# Patient Record
Sex: Female | Born: 1945 | Race: White | Hispanic: No | Marital: Married | Smoking: Former smoker
Health system: Southern US, Community
[De-identification: ages and names within clinical notes are randomized; demographics above are authoritative.]

## PROBLEM LIST (undated history)

## (undated) DIAGNOSIS — N189 Chronic kidney disease, unspecified: Secondary | ICD-10-CM

## (undated) DIAGNOSIS — I639 Cerebral infarction, unspecified: Secondary | ICD-10-CM

## (undated) DIAGNOSIS — K5792 Diverticulitis of intestine, part unspecified, without perforation or abscess without bleeding: Secondary | ICD-10-CM

## (undated) DIAGNOSIS — H18469 Peripheral corneal degeneration, unspecified eye: Secondary | ICD-10-CM

## (undated) DIAGNOSIS — E119 Type 2 diabetes mellitus without complications: Secondary | ICD-10-CM

## (undated) DIAGNOSIS — G44009 Cluster headache syndrome, unspecified, not intractable: Secondary | ICD-10-CM

## (undated) DIAGNOSIS — J302 Other seasonal allergic rhinitis: Secondary | ICD-10-CM

## (undated) DIAGNOSIS — I10 Essential (primary) hypertension: Secondary | ICD-10-CM

## (undated) DIAGNOSIS — L509 Urticaria, unspecified: Secondary | ICD-10-CM

## (undated) DIAGNOSIS — M1990 Unspecified osteoarthritis, unspecified site: Secondary | ICD-10-CM

## (undated) DIAGNOSIS — E785 Hyperlipidemia, unspecified: Secondary | ICD-10-CM

## (undated) HISTORY — DX: Peripheral corneal degeneration, unspecified eye: H18.469

## (undated) HISTORY — DX: Essential (primary) hypertension: I10

## (undated) HISTORY — DX: Hyperlipidemia, unspecified: E78.5

## (undated) HISTORY — DX: Cluster headache syndrome, unspecified, not intractable: G44.009

## (undated) HISTORY — DX: Cerebral infarction, unspecified: I63.9

## (undated) HISTORY — DX: Diverticulitis of intestine, part unspecified, without perforation or abscess without bleeding: K57.92

## (undated) HISTORY — DX: Urticaria, unspecified: L50.9

## (undated) HISTORY — DX: Unspecified osteoarthritis, unspecified site: M19.90

## (undated) HISTORY — DX: Chronic kidney disease, unspecified: N18.9

## (undated) HISTORY — DX: Other seasonal allergic rhinitis: J30.2

## (undated) HISTORY — DX: Type 2 diabetes mellitus without complications: E11.9

---

## 1999-05-02 ENCOUNTER — Other Ambulatory Visit: Admission: RE | Admit: 1999-05-02 | Discharge: 1999-05-02 | Payer: Self-pay | Admitting: Family Medicine

## 2000-06-25 ENCOUNTER — Other Ambulatory Visit: Admission: RE | Admit: 2000-06-25 | Discharge: 2000-06-25 | Payer: Self-pay | Admitting: Family Medicine

## 2001-07-16 ENCOUNTER — Other Ambulatory Visit: Admission: RE | Admit: 2001-07-16 | Discharge: 2001-07-16 | Payer: Self-pay | Admitting: Family Medicine

## 2003-10-23 ENCOUNTER — Encounter: Admission: RE | Admit: 2003-10-23 | Discharge: 2003-10-23 | Payer: Self-pay | Admitting: Family Medicine

## 2005-10-11 ENCOUNTER — Ambulatory Visit: Payer: Self-pay | Admitting: Internal Medicine

## 2005-10-11 ENCOUNTER — Observation Stay (HOSPITAL_COMMUNITY): Admission: EM | Admit: 2005-10-11 | Discharge: 2005-10-12 | Payer: Self-pay | Admitting: Emergency Medicine

## 2006-07-17 ENCOUNTER — Ambulatory Visit: Payer: Self-pay | Admitting: Cardiology

## 2006-07-17 ENCOUNTER — Inpatient Hospital Stay (HOSPITAL_COMMUNITY): Admission: EM | Admit: 2006-07-17 | Discharge: 2006-07-19 | Payer: Self-pay | Admitting: Emergency Medicine

## 2006-07-18 ENCOUNTER — Encounter (INDEPENDENT_AMBULATORY_CARE_PROVIDER_SITE_OTHER): Payer: Self-pay | Admitting: Neurology

## 2006-07-18 ENCOUNTER — Encounter: Payer: Self-pay | Admitting: Cardiology

## 2009-04-23 ENCOUNTER — Encounter: Admission: RE | Admit: 2009-04-23 | Discharge: 2009-04-23 | Payer: Self-pay | Admitting: Family Medicine

## 2010-03-08 LAB — LIPID PANEL
Cholesterol: 175 mg/dL (ref 0–200)
HDL: 43 mg/dL (ref 35–70)
LDL Cholesterol: 107 mg/dL
Triglycerides: 208 mg/dL — AB (ref 40–160)

## 2010-03-08 LAB — HEMOGLOBIN A1C: Hgb A1c MFr Bld: 6.5 % — AB (ref 4.0–6.0)

## 2010-08-02 LAB — CBC AND DIFFERENTIAL
HEMATOCRIT: 40 % (ref 36–46)
HEMOGLOBIN: 13.2 g/dL (ref 12.0–16.0)
PLATELETS: 276 10*3/uL (ref 150–399)
WBC: 9.6 10^3/mL

## 2010-08-02 LAB — TSH: TSH: 5.68 u[IU]/mL (ref 0.41–5.90)

## 2010-12-30 NOTE — Discharge Summary (Signed)
Jamie Aguirre, Jamie Aguirre                ACCOUNT NO.:  [id number]   MEDICAL RECORD NO.:  [id number]          PATIENT TYPE:  INP   LOCATION:  3037                         FACILITY:  [facility]   PHYSICIAN:  Jamie Aguirre, M.D.DATE OF BIRTH:  02/13/1946   DATE OF ADMISSION:  10/11/2005  DATE OF DISCHARGE:  10/12/2005                                 DISCHARGE SUMMARY   CONSULTANT:  Dr. Pearlean Aguirre, Neurology.   PRIMARY CARE PHYSICIAN:  Dr. Laurann Montana.   DISCHARGE DIAGNOSES:  1.  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  2.  Coronary artery disease equivalent.  3.  Hyperlipidemia.  4.  Depression.  5.  History of hypertension.  6.  History of diverticulitis.   DISCHARGE MEDICATIONS:  1.  Aggrenox one capsule p.o. daily x2 weeks, then increase to one capsule      p.o. twice daily.  2.  Lovastatin 20 mg p.o. nightly.   The patient will continue all of her previous medications of:  1.  Atenolol 100 mg p.o. nightly.  2.  Zyrtec 75 mg p.o. nightly.   CONDITION ON DISCHARGE:  The patient's overall disposition from her initial  presentation is improved.  She is being discharged to home with home health  PT.   ACTIVITY:  As per home health PT.   DISCHARGE DIET:  A heart-healthy diet.   FOLLOWUP APPOINTMENT:  The patient will follow up with PCP, Dr. Laurann Montana, in 1-2 weeks.   ALLERGIES:  The patient has a listed allergy to NSAIDS.  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   HISTORY OF PRESENT ILLNESS:  The patient is a 65 year old white female with  a past medical history of hypertension and extensive family history of CVA  and CAD, who presented to the emergency room after waking up on October 11, 2005 with right-sided arm weakness and dysarthric speech.  Initially,  she  became quite concerned and was worried that she had had a stroke, as she had  seen similar symptoms in her mother.   HOSPITAL COURSE:  She had come in and after several hours, symptoms resolved  on their own.  Initially, this was thought to be a TIA, given that her  symptoms were under 24 hours; however, a CT of the head was negative for any  acute infarct.  However, an MRI showed a sub-centimeter area of acute  infarction in the left posterior thalamus, centrum semiovale and posterior  limb of the internal capsule on the left.  Following these findings, I  discussed this with Dr. Pearlean Aguirre and the patient was felt to indeed have had a  CVA.  When I discussed these findings with the patient, she was quite  concerned and felt that she was indeed quite lucky.  We had a fasting lipid  profile ordered on the patient in the morning and it was found  the patient  had an elevated cholesterol with an LDL of 151.  In addition, given the  patient's acute CVA, noting of old infarct on CT and cholesterol findings,  we recommended the patient start additional medication of Aggrenox one  capsule p.o. daily x2 weeks, then increase to twice daily.  In addition, we  also recommended the patient start a cholesterol medication with a target  LDL range of a CAD equivalent of LDL range down to 70.  I discussed this  with the patient and initially she was quite resistant to taking  medications, especially Aggrenox; however, we discussed extensively the  benefits versus risks and we indeed felt that it would be better if she go  to taking these medications and she concurred.  In addition, Physical  Therapy worked with patient and recommended home health physical therapy.  In addition, we have also recommended further weight loss and dietary  counseling for this patient, on which she says that she will work.  The  patient's overall disposition is improved from her initial presentation.  How she does long-term  will depend on whether or not she is compliant with  her medications and the lifestyle changes.   The patient has a listed allergy to NSAIDS.  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  The patient will,  prior to discharge, receive 1 dose of Aggrenox and be monitored for several  hours; if she has no reaction, she will then be discharged on Aggrenox.      Jamie Aguirre, M.D.  Electronically Signed     SKK/MEDQ  D:  10/12/2005  T:  10/13/2005  Job:  [id number]   cc:   Jamie Aguirre. Jamie Aguirre, M.D.  Fax: [phone]   Jamie P. Pearlean Brownie, MD  Fax: [phone]

## 2010-12-30 NOTE — Consult Note (Signed)
[name], MEDAL                ACCOUNT NO.:  [id number]   MEDICAL RECORD NO.:  [id number]          PATIENT TYPE:  EMS   LOCATION:  MAJO                         FACILITY:  [facility]   PHYSICIAN:  Pramod P. Pearlean Brownie, MD    DATE OF BIRTH:  [birth year]   DATE OF CONSULTATION:  DATE OF DISCHARGE:                                   CONSULTATION   REFERRING PHYSICIAN:  Dr. Dione Booze   REASON FOR CONSULTATION:  Stroke.   HISTORY OF PRESENT ILLNESS:  [name] is a 65 year old Caucasian lady who  was admitted with sudden onset of right-sided weakness and expressive  aphagia when she woke up from sleep at 5:30 today. Patient states she went  to sleep at about midnight and was fine at that time. She did not wake up in  between to go to the restroom. When she woke up at 5:30 she noticed that her  right arm was weak, when she tried to get out of bed the right leg was weak  too, and she has trouble formulating speech. She also had trouble dialing  911 on the phone, it took her about 10-15 minutes. By the time EMS brought  her to the hospital she was still weak and had speech difficulty. The  symptoms seemed apparently resolved barely 15 minutes prior to my  assessment. She has no known prior history of stroke, TIA, or significant  neurological problems.   PAST MEDICAL HISTORY:  Significant for:  1.  Hypertension.  2.  Diverticulitis.  3.  Sinusitis.  4.  Depression.   SOCIAL HISTORY:  The patient is retired. 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 She does not smoke or drink. She lives alone.  Primary physician is Dr. Cliffton Asters.   MEDICATION ALLERGIES:  MULTIPLE. ONLY INCLUDING SULFATE AND SULFA LISTED IN  THE CHART, BUT SHE IS UNABLE TO GIVE ME THE COMPLETE LIST AT PRESENT.   HOME MEDICATIONS:  1.  Atenolol 100 mg a day  2.  Cyclobenzaprine 10 mg 3 times a day.  3.  Zyrtec 10 mg at bedtime.  4.  Fluoxetine 10 mg daily.   REVIEW OF SYSTEMS:  Negative for any  chest pain, shortness of breath, cough,  diarrhea. Positive for weakness, numbness, and speech difficulties.   PHYSICAL EXAMINATION:  GENERAL:  Reveals pleasant middle-aged Caucasian lady  who is sitting comfortably up in bed.  VITAL SIGNS:  She is afebrile. Pulse rate 64. Respiratory rate 24. Blood  pressure at present is 147/86.  HEENT:  Head is nontraumatic. ENT exam is significant for decreased hearing  bilaterally. Face is symmetric bilaterally. Movements are normal. Tongue is  midline.  NECK:  Supple without bruit.  CARDIAC:  Regular heart sounds.  ABDOMEN:  Soft, nontender.  NEUROLOGIC:  She is pleasant, awake, alert, and cooperative. There is no  aphasia or __________. She can name, repeat, and comprehend quite well.  Cranial nerve exam is unremarkable. Motor system exam in upper extremities  suggest symmetric strength, tone, and reflexes are __________.  Plantars are  downgoing.  Sensation is intact. Finger-to-nose and heel-toe coordination  accurate. Gait was not tested. NIH stroke scale is 0.   DATA REVIEWED:  CT scan of the head noncontrasted done today reveals small  __________ remote age infarct in the right head. Area of hyperdensity is  noted in the left middle cerebral artery, which may represent __________ and  dural clot is also possibility. There is a very subtle area of low density  in the left internal capsule, which may represent early infarct. CBC and  admission labs are unremarkable.   IMPRESSION:  [age]-year-old lady with sudden onset of expressive  aphagia, right hemiparesis with onset on sleep who presented beyond 3 hours.  Symptoms have resolved. She likely has a small left middle cerebral artery  infarct, which has improved. The patient has had near complete improvement  in her symptoms and presented beyond 3 hours and, hence, she is not a  candidate for thrombolysis or __________  stroke study. She will be admitted  for further stroke risk  stratification workup, telemetry monitoring. We will  start her on IV heparin until an MRI is obtained and the status of left  middle cerebral artery is known, as there is a question of possible clot  there. Will also check 2-D echocardiogram, Doppler studies, carotid  ultrasound, fasting lipid profile, hemoglobin A1C, and homocystine. Patient  has been admitted to __________ [facility], will be happy to follow on  consults. Kindly call for questions.           ______________________________  Sunny Schlein. Pearlean Brownie, MD     PPS/MEDQ  D:  10/11/2005  T:  10/11/2005  Job:  [id number]   cc:   Stacie Acres. Cliffton Asters, M.D.  Fax: [phone]

## 2010-12-30 NOTE — H&P (Signed)
[name], [name]                ACCOUNT NO.:  [id number]   MEDICAL RECORD NO.:  [id number]          PATIENT TYPE:  INP   LOCATION:  3705                         FACILITY:  [facility]   PHYSICIAN:  Marlan Palau, M.D.  DATE OF BIRTH:  1945/10/10   DATE OF ADMISSION:  07/17/2006  DATE OF DISCHARGE:                              HISTORY & PHYSICAL   HISTORY OF PRESENT ILLNESS:  Jamie Aguirre is a 65 year old right-handed  female born Sep 24, 1945 with a history of cerebrovascular disease  with a prior cerebrovascular infarct in February 2007. Seen at that time  by Dr. Pearlean Brownie.  This patient had an acute event involving the left  posterior thalamus, centrum semiovale.  The patient was treated with  Aggrenox initially but the patient is now on aspirin and Plavix.  The  patient returns to the emergency room today with a one-week history of  onset of right-sided visual field disturbance and left-sided headache.  This has been persistent.  The patient was seen by the eye doctor today  and was sent to the emergency room for an evaluation of possible stroke.  CT scan of the brain done through the emergency room does show left  parietal white matter low density area and the low density area in the  left thalamus but this is old, similar to what was seen in February  2007.  Neurology was asked to see this patient for further evaluation.  The patient noted onset of the visual field disturbance; for the first  24 hours, the patient had slowness of speech and slurring of speech but  this has fully resolved.  The patient reports no numbness or weakness in  the arms or legs or balance problems.   PAST MEDICAL HISTORY:  Significant for:  1. History of cerebrovascular disease in the past as above.  New onset      right visual field deficit.  Headache.  2. Hypertension.  3. Dyslipidemia.  4. Diverticulitis.  5. Depression.  6. Obesity.  7. Multiple allergies.  8. Decreased auditory acuity.  9.  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   CURRENT MEDICATIONS:  Include:  1. Aspirin 81 mg daily.  2. Plavix 75 mg daily.  3. Atenolol 100 mg daily.  4. Zyrtec 10 mg daily.  5. The patient is on another blood pressure medication that she could      not remember the name of, probably Lovastatin, as she was on this      previously.   ALLERGIES:  THE PATIENT HAS ALLERGY TO PENICILLIN, SULFA DRUGS, CODEINE  AND CIPRO.   SOCIAL HISTORY:  Does not smoke or drink.   SOCIAL HISTORY:  The patient [place] in the [place]  area.  The patient is not married.  Has no children.  Is not employed.   FAMILY MEDICAL HISTORY:  Notable that the mother died with stroke.  Father died with an MI but had several years of incapacity from anoxic  injury following a bypass procedure for his heart.   REVIEW OF SYSTEMS:  Notable for no  recent fevers or chills.  The patient  does note headache but does have a long term history of neck pain.  Denies any problems with shortness of breath, chest pains, abdominal  pain, nausea, vomiting, problems controlling the bowels or bladder.  Denies any black out episodes or gait disturbance problems.  The patient  has no siblings.  She is an only child.   PHYSICAL EXAMINATION:  VITAL SIGNS:  Blood pressure 153/89.  Heart rate  70.  Respiratory rate 18.  Temperature afebrile.  GENERAL: This patient is a markedly obese white female who is alert and  cooperative the time of examination.  HEENT:  Head is atraumatic.  Eyes:  Pupils equal, round and reactive to  light.  Disks are sharp and flat bilaterally.  NECK:  Supple.  No carotid bruits noted.  RESPIRATORY:  Clear.  CARDIOVASCULAR:  Reveals a regular rate and rhythm.  No obvious murmurs  or rubs noted.  ABDOMEN: Obese.  Positive bowel sounds.  No organomegaly or tenderness  noted.  EXTREMITIES:  Without significant edema.  NEUROLOGIC:  Cranial nerves as above.  Facial symmetry is present.   The  patient has good sensation of the face to pinprick and soft touch  bilaterally.  Has good strength of the facial muscles and head muscles.  Good head turn and shoulder shrug bilaterally.  Speech is well  enunciated and not aphasic.  Again, visual field testing is full to  direct testing.  The patient has good strength in all fours.  Good  symmetric muscle tone is noted throughout.  Sensory testing is intact to  pinprick, soft touch, vibratory sensation throughout.  Position sense  intact in all fours.  No evidence of extinction is noted.  The patient  has good finger-nose-finger and heel-to-shin and no drift.  Deep tendon  reflexes are symmetric and normal.  Toes downgoing bilaterally.   LABORATORY DATA:  Laboratory values are pending at this time.   EKG and chest x-ray are pending.   IMPRESSION:  1. History of cerebrovascular disease with left thalamic stroke in      February 2007.  2. New onset right visual field deficit and headache on the left.  3. Hypertension.  4. Obesity.   This patient has sustained a deficit recently with visual field  disturbance on the right.  NIH Stroke Scale right now is 0; t-PA was not  considered due to duration of symptoms and minimal deficit.  The patient  will be brought in for further evaluation at this point.   PLAN:  1. Admission to [facility]  2. MRI of the brain.  3. MR angiogram of the intracranial and extracranial vessels.  4. 2D echocardiogram.  5. Switch to Aggrenox therapy.  6. Will follow patient's clinical course while in-house.  7. We will check a sed rate given the headaches and visual      disturbance.  It would be unusual for CT of the head, a week after      the onset of a stroke, to be unremarkable as it is today.      Marlan Palau, M.D.  Electronically Signed     CKW/MEDQ  D:  07/17/2006  T:  07/18/2006  Job:  [id number]   cc:   Stacie Acres. Cliffton Asters, M.D. [facility]

## 2010-12-30 NOTE — Discharge Summary (Signed)
[name], [name]                ACCOUNT NO.:  [id number]   MEDICAL RECORD NO.:  [id number]          PATIENT TYPE:  INP   LOCATION:  3705                         FACILITY:  [facility]   PHYSICIAN:  Jamie P. Jamie Brownie, MD    DATE OF BIRTH:  1945-11-15   DATE OF ADMISSION:  07/17/2006  DATE OF DISCHARGE:                               DISCHARGE SUMMARY   DIAGNOSIS AT TIME OF DISCHARGE.:  1. 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  2. Hypertension.  3. Diabetes.  4. Obesity.  5. History of left subcortical infarct in the past.  6. Decreased hearing with hearing aids.  7. Carotid tumor resection.   MEDICINES AT TIME OF DISCHARGE:  1. Plavix 75 mg a day.  2. Aspirin 81 mg a day.  3. Atenolol 100 mg a day.  4. Lovastatin 200 mg a day.  5. Claritin 10 mg a day.   STUDIES PERFORMED.:  1. CT of the brain.  CT of the brain showed no acute abnormalities      with remote left parathalmic infarct noted.  2. MRI of the brain shows acute stroke in the posterior medial      temporal lobe on the left, chronic small vessel changes essentially      the same as in  February of last year.  3. MRA of the neck is essentially negative.  Mild narrowing of right      vertebral artery at origin.  4. MRA of the head, no flow and distal left PCA either occluded or      markedly stenotic. Right PCA is open no significant anterior      circulation finding.  5. 2-D echocardiogram showed EF of 60% with no obvious embolic source.      Carotid Doppler shows no ICA stenosis bilaterally.  Vertebral      artery flow antegrade bilaterally.  6. EKG shows sinus bradycardia, left axis deviation, moderate voltage      criteria for LVH, may be normal variant.   LABORATORY STUDIES:  CBC with white blood cells 10.5, otherwise normal.  Chemistry with glucose 125, otherwise normal. Coags on admission normal.  Liver function tests normal.  Cardiac enzymes negative.  Cholesterol  154, HDL 34, LDL 102, and triglycerides 138.   Urinalysis was 0 to 2  white blood cells, 0 to 2 red blood cells and no leukocyte esterase.  Urine drug screen was normal.  Homocystine 13.4.  Alcohol level less  than five SED rate 24, hemoglobin A1c 6.4.   HISTORY OF PRESENT ILLNESS:  Jamie Aguirre is a 65 year old right-handed  female with history of cerebrovascular disease with a prior cerebral  infarct in February of 2007.  She was seen at that time by Dr. Pearlean Aguirre.  She had an acute event during the left posterior thalamus centrum  semiovale.  The patient was treated with Aggrenox initially but is now  on aspirin and Plavix.  The patient returns to the emergency room with a  1-week history of right sided visual field disturbance and left sided  headache.  This has been persistent.  She  went to the ophthalmologist  today and was sent to the emergency room for evaluation of a possible  stroke.  CT of the brain done in the emergency room does show a left  parietal white matter low density area and the low density area in the  left thalamus, but this is old and similar to what was seen in February  2007.  Neurology was asked to see for further evaluation.  The patient  was noted to have onset of visual field disturbance. For the first 24  hours the patient had slowness of speech and slurring of speech but this  is fully resolved.  The patient reports no numbness or weakness in arms  or legs or balance problems.  She was admitted to the hospital for  further stroke evaluation.   MRI did reveal an acute infarct in the left medial temporal area.  She  had distal left PCA obstruction.  She was placed on Aggrenox but did not  tolerate it secondary to headache and was placed back on aspirin and  Plavix.  TEE was recommended and was arranged with Dr. Carolanne Grumbling.  The patient went to the lab, but the TEE was cancelled secondary to  elevated blood pressure of 191/108.  The patient on Lopressor 100 mg at  home, but states she has not had it since  Monday.  Attempted to add  hydrochlorothiazide to current regimen, thought the patient refuses this  as she has had a reaction to that the past.  She is adamant that if she  remains back on her scheduled medications that her blood pressure will  be within normal limits.  Dr. Pearlean Aguirre discussed plan with Dr. Mayford Knife and  both agreed that the patient would be best served to return home and  follow up with her next week for an outpatient TEE.  The patient was  agreeable to this and the patient was discharged home.   CONDITION ON DISCHARGE:  The patient alert and oriented x3.  Speech  clear.  No aphasia.  Face is symmetric.  Extraocular movements intact.  She does have decreased vision in the line visual field, but she can  count fingers.  Chest: Clear to auscultation.  Heart rate is regular.  Her left lower extremity strength is 4/5, otherwise 5/5 strength.  Sensation is intact.  No ataxia.  Finger to nose okay.  Fine motor  movement okay.   DISCHARGE/PLAN:  1. Discharge home.  2. Resume home antihypertensive.  3. Follow up with Dr. Armanda Aguirre next week for outpatient TEE.  4. Follow-up primary care physician for blood pressure control.  5. Followup with Dr. Pearlean Aguirre in 2-3 months.  Call for appointment.      Jamie Aguirre, N.P.    ______________________________  Jamie Schlein. Jamie Brownie, MD    SB/MEDQ  D:  07/19/2006  T:  07/20/2006  Job:  [id number]   cc:   Jamie Aguirre, M.D.  Jamie Aguirre, M.D.  Jamie P. Jamie Brownie, MD

## 2010-12-30 NOTE — H&P (Signed)
Jamie Aguirre, Jamie Aguirre                ACCOUNT NO.:  [id number]   MEDICAL RECORD NO.:  [id number]          PATIENT TYPE:  EMS   LOCATION:  [facility]                         FACILITY:  [facility]   PHYSICIAN:  Hollice Espy, M.D.DATE OF BIRTH:  04/14/1946   DATE OF ADMISSION:  10/11/2005  DATE OF DISCHARGE:                                HISTORY & PHYSICAL   PRIMARY CARE PHYSICIAN:  Dr. Laurann Montana   CONSULTS:  Dr. Pearlean Brownie, neurology   CHIEF COMPLAINT:  Episode of right-sided arm weakness and difficulty  speaking.   HISTORY OF PRESENT ILLNESS:  Patient is a 65 year old white female with past  medical history of hypertension and obesity as well as a strong family  history for CVA who woke up on day of admission and found herself extremely  difficulty speaking as well as some left arm numbness and weakness. She was  concerned that she had a stroke as she had her mom who had multiple mini  strokes and full strokes and came into the emergency room and called the  paramedics.  The paramedics notified the ER and plans were made for a code  stroke.  Given the fact the patient woke up with her symptoms she felt she  was not a candidate for aggressive intervention.  However, fortunately  during patient's stay in the emergency room she found suddenly that her  symptoms were resolving and finally in the morning at some point in the  morning during her emergency room stay her symptoms completely resolved.  Currently, she is doing well.  She appears to be quite excited that her  symptoms resolved.  She denies any headaches, vision changes, dysphagia,  chest pain, palpitations, shortness of breath, wheeze, cough, abdominal  pain, hematuria, dysuria, constipation, diarrhea, focal extremity numbness,  weakness, or pain.  She tells me that her symptoms earlier did complain of  some right arm numbness and weakness that started in the shoulder down to  the hand as well as she found herself with difficulty  able to speak well,  and unable to find the right correct words.  In the emergency room patient  was started on IV heparin after being evaluated by the neurologist and felt  that she would likely need to be admitted for further evaluation including  MRI, Dopplers, and echocardiogram.  Currently, patient has no complaints.  Review of systems is otherwise negative.  The rest of her laboratories were  unremarkable.  CT showed signs of an old stroke and a questionable low  density but again, this was questionable.   PAST MEDICAL HISTORY:  1.  Obesity.  2.  Diverticulitis.  3.  Hypertension.  4.  The patient has been told she has high cholesterol but she is not on any      medicines for this.   MEDICATIONS:  Patient takes Zyrtec and Flexeril p.r.n.  She takes Prozac and  atenolol daily.   ALLERGIES:  She is allergic to CODEINE and SULFA.   SOCIAL HISTORY:  She denies any tobacco, alcohol, or drug use.   FAMILY HISTORY:  Noted for extensive  CAD and CVA.   PHYSICAL EXAMINATION:  VITAL SIGNS:  On admission temperature 97, heart rate  64, blood pressure 148/86, respirations 24, O2 saturation 100% on 15 L.  Now  she is saturating 100% on 2 L.  GENERAL:  Patient is alert and oriented x3, in no apparent distress.  HEENT:  Normocephalic, atraumatic.  Her mucous membranes are moist.  She has  no carotid bruits.  Cranial nerves II-XII all appear intact.  HEART:  Regular rate and rhythm.  Questionable 2/6 systolic ejection murmur.  LUNGS:  Clear to auscultation bilaterally.  ABDOMEN:  Soft, obese, nontender.  Positive bowel sounds.  EXTREMITIES:  No clubbing, cyanosis, or edema.  MUSCULOSKELETAL:  Appears to be intact with good flexion/extension in the  upper and lower extremities, appears to be 5/5, although in patient's gait  she is slightly unsteady on her feet, but does not appear to have a focal  sign of deficit.  No pronator drift.  No evidence of Babinski's sign.  No  cerebellar  dysfunction.   LABORATORIES:  White count 8.  No shift.  H&H 13.3/37.5, MCV 84, platelet  count 305.  Sodium 133, potassium 4.3, chloride 101, bicarbonate 25, BUN 14,  creatinine 1.1, glucose 141.  LFTs are unremarkable.  PT 13.2, INR 1, PTT  27.  A1c, urinalysis, and urine drug screen are all pending.  X-ray of a CT  shows no acute hemorrhage, focus of low density in the periventricular white  matter in the left parietal lobe, likely represents age indeterminate  ischemia.  Possibly this could be early ischemia or infarct but also signs  of ill definition of the left ventricle and increased density in the left  cerebral artery which, given the right circumstances, could also be an early  infarct.  MRI/MRA of the head and neck are currently done and results are  pending.   ASSESSMENT/PLAN:  1.  Possible mild cerebrovascular accident versus likely transient ischemic      attack.  Agree with continuing the patient on heparin.  Will check all      screening laboratories including carotid Dopplers, echocardiogram,      transcranial Dopplers, MRI, as well as fasting lipid profile and      homocysteine level as well as a hemoglobin A1c given patient's elevated      blood sugars to find risk factors that possibly need to be corrected.      Most likely patient will definitely need to start on an aspirin daily      and possibly a cholesterol medication and/or folic acid and/or diabetic      agent if she is found to have elevated blood sugars.  Will continue to      follow.  2.  Hypertension.  Will continue her atenolol.      Hollice Espy, M.D.  Electronically Signed     SKK/MEDQ  D:  10/11/2005  T:  10/11/2005  Job:  [id number]   cc:   Stacie Acres. Cliffton Asters, M.D.  Fax: [phone]   Pramod P. Pearlean Brownie, MD  Fax: [phone]

## 2011-02-03 ENCOUNTER — Ambulatory Visit: Payer: 59 | Attending: Family Medicine | Admitting: Physical Therapy

## 2011-02-03 DIAGNOSIS — R42 Dizziness and giddiness: Secondary | ICD-10-CM | POA: Insufficient documentation

## 2011-02-03 DIAGNOSIS — IMO0001 Reserved for inherently not codable concepts without codable children: Secondary | ICD-10-CM | POA: Insufficient documentation

## 2011-02-08 ENCOUNTER — Ambulatory Visit: Payer: 59 | Admitting: *Deleted

## 2011-02-14 ENCOUNTER — Encounter: Payer: 59 | Admitting: Rehabilitative and Restorative Service Providers"

## 2011-02-17 ENCOUNTER — Encounter: Payer: 59 | Admitting: Physical Therapy

## 2011-02-22 ENCOUNTER — Ambulatory Visit: Payer: 59 | Attending: Family Medicine | Admitting: Rehabilitative and Restorative Service Providers"

## 2011-02-22 DIAGNOSIS — R42 Dizziness and giddiness: Secondary | ICD-10-CM | POA: Insufficient documentation

## 2011-02-22 DIAGNOSIS — IMO0001 Reserved for inherently not codable concepts without codable children: Secondary | ICD-10-CM | POA: Insufficient documentation

## 2011-03-01 ENCOUNTER — Ambulatory Visit: Payer: 59 | Admitting: *Deleted

## 2011-03-02 ENCOUNTER — Encounter: Payer: 59 | Admitting: Physical Therapy

## 2011-03-06 ENCOUNTER — Ambulatory Visit: Payer: 59 | Admitting: Physical Therapy

## 2012-01-01 ENCOUNTER — Encounter: Payer: Self-pay | Admitting: Internal Medicine

## 2012-01-01 ENCOUNTER — Ambulatory Visit (INDEPENDENT_AMBULATORY_CARE_PROVIDER_SITE_OTHER): Payer: 59 | Admitting: Internal Medicine

## 2012-01-01 ENCOUNTER — Other Ambulatory Visit (INDEPENDENT_AMBULATORY_CARE_PROVIDER_SITE_OTHER): Payer: 59

## 2012-01-01 VITALS — BP 160/82 | HR 76 | Temp 98.1°F | Ht 64.0 in | Wt 235.0 lb

## 2012-01-01 DIAGNOSIS — E785 Hyperlipidemia, unspecified: Secondary | ICD-10-CM

## 2012-01-01 DIAGNOSIS — Z0000 Encounter for general adult medical examination without abnormal findings: Secondary | ICD-10-CM

## 2012-01-01 DIAGNOSIS — J302 Other seasonal allergic rhinitis: Secondary | ICD-10-CM | POA: Insufficient documentation

## 2012-01-01 DIAGNOSIS — Z1239 Encounter for other screening for malignant neoplasm of breast: Secondary | ICD-10-CM

## 2012-01-01 DIAGNOSIS — E119 Type 2 diabetes mellitus without complications: Secondary | ICD-10-CM | POA: Insufficient documentation

## 2012-01-01 DIAGNOSIS — J42 Unspecified chronic bronchitis: Secondary | ICD-10-CM | POA: Insufficient documentation

## 2012-01-01 DIAGNOSIS — E669 Obesity, unspecified: Secondary | ICD-10-CM | POA: Insufficient documentation

## 2012-01-01 DIAGNOSIS — I10 Essential (primary) hypertension: Secondary | ICD-10-CM

## 2012-01-01 DIAGNOSIS — Z23 Encounter for immunization: Secondary | ICD-10-CM

## 2012-01-01 DIAGNOSIS — I63512 Cerebral infarction due to unspecified occlusion or stenosis of left middle cerebral artery: Secondary | ICD-10-CM | POA: Insufficient documentation

## 2012-01-01 DIAGNOSIS — N189 Chronic kidney disease, unspecified: Secondary | ICD-10-CM | POA: Insufficient documentation

## 2012-01-01 LAB — URINALYSIS, ROUTINE W REFLEX MICROSCOPIC
Ketones, ur: NEGATIVE
Urine Glucose: NEGATIVE

## 2012-01-01 LAB — CBC WITH DIFFERENTIAL/PLATELET
Basophils Relative: 0.2 % (ref 0.0–3.0)
Eosinophils Relative: 3.1 % (ref 0.0–5.0)
HCT: 38.3 % (ref 36.0–46.0)
Hemoglobin: 12.9 g/dL (ref 12.0–15.0)
MCHC: 33.7 g/dL (ref 30.0–36.0)
MCV: 84.2 fl (ref 78.0–100.0)
Monocytes Absolute: 0.5 10*3/uL (ref 0.1–1.0)
Neutro Abs: 6.8 10*3/uL (ref 1.4–7.7)
Neutrophils Relative %: 71.6 % (ref 43.0–77.0)
RBC: 4.55 Mil/uL (ref 3.87–5.11)
WBC: 9.5 10*3/uL (ref 4.5–10.5)

## 2012-01-01 LAB — BASIC METABOLIC PANEL
CO2: 28 mEq/L (ref 19–32)
Chloride: 101 mEq/L (ref 96–112)
Creatinine, Ser: 1.2 mg/dL (ref 0.4–1.2)
Potassium: 4.4 mEq/L (ref 3.5–5.1)
Sodium: 138 mEq/L (ref 135–145)

## 2012-01-01 LAB — HEPATIC FUNCTION PANEL
Albumin: 4.3 g/dL (ref 3.5–5.2)
Total Protein: 7.9 g/dL (ref 6.0–8.3)

## 2012-01-01 LAB — LIPID PANEL
HDL: 53.4 mg/dL (ref >39.00)
LDL Cholesterol: 80 mg/dL (ref 0–99)
Total CHOL/HDL Ratio: 3
Triglycerides: 147 mg/dL (ref 0.0–149.0)
VLDL: 29.4 mg/dL (ref 0.0–40.0)

## 2012-01-01 NOTE — Assessment & Plan Note (Signed)
Wt Readings from Last 3 Encounters:  01/01/12 235 lb (106.595 kg)   The patient is asked to make an attempt to improve diet and exercise patterns to aid in medical management of this problem.

## 2012-01-01 NOTE — Progress Notes (Signed)
Subjective:    Patient ID: Jamie Aguirre, female    DOB: 04/29/1946, 66 y.o.   MRN: [id number]  HPI  New pt to me and our practice, here to establish care:  Also patient is here today for annual physical. Patient feels well overall, but frustrated with overall health status.  Also reviewed chronic medical issues today  Obesity - has not succeeded with prior nutrition referral or weight watcers - not interested in "pills or surgery" - recognizes constant need to eat and "unable to control myself", but denies depression overlap.  DM2 - the patient reports compliance with medication(s) as prescribed. Denies adverse side effects. Checks cbgs 2-3x/day and denies symptoms hypogylcemia  CVA hx - the patient reports compliance with medication(s) as prescribed. Denies adverse side effects.  hypertension - the patient reports compliance with medication(s) as prescribed. Denies adverse side effects.  Past Medical History  Diagnosis Date  . Asthma   . Arthritis   . Type 2 diabetes mellitus   . Diverticulitis   . Chronic bronchitis   . Headaches, cluster   . Allergic rhinitis, seasonal   . Chronic kidney disease   . Hypertension   . Hyperlipidemia   . 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 Arthritis Other   . Stroke Other   . Hypertension Other   . Hyperlipidemia Other   . Diabetes Other    History  Substance Use Topics  . Smoking status: Former Games developer  . Smokeless tobacco: Not on file  . Alcohol Use: No    Review of Systems Constitutional: Negative for fever or weight change.  Respiratory: Negative for cough and shortness of breath.   Cardiovascular: Negative for chest pain or palpitations.  Gastrointestinal: Negative for abdominal pain, no bowel changes.  Musculoskeletal: Negative for gait problem or joint swelling.  Skin: Negative for rash.  Neurological: Negative for dizziness or headache.  No other specific  complaints in a complete review of systems (except as listed in HPI above).     Objective:   Physical Exam BP 160/82  Pulse 76  Temp(Src) 98.1 F (36.7 C) (Oral)  Ht 5\' 4"  (1.626 m)  Wt 235 lb (106.595 kg)  BMI 40.34 kg/m2  SpO2 95% Wt Readings from Last 3 Encounters:  01/01/12 235 lb (106.595 kg)   Constitutional: Jamie Aguirre is overweight, but appears well-developed and well-nourished. No distress.  HENT: Head: Normocephalic and atraumatic. Ears: HOH, B hearing aides B TMs ok, no erythema or effusion; Nose: Nose normal. Mouth/Throat: Oropharynx is clear and moist. No oropharyngeal exudate.  Eyes: wears glasses. Conjunctivae and EOM are normal. Pupils are equal, round, and reactive to light. No scleral icterus.  Neck: Thick. Normal range of motion. Neck supple. No JVD present. No thyromegaly present.  Cardiovascular: Normal rate, regular rhythm and normal heart sounds.  No murmur heard. No BLE edema, but B fatty ankles. Pulmonary/Chest: Effort normal and breath sounds normal. No respiratory distress. Jamie Aguirre has no wheezes.  Abdominal: Soft. Bowel sounds are normal. Jamie Aguirre exhibits no distension. There is no tenderness. no masses Musculoskeletal: R soft splint, recent wrist fx. Normal range of motion, no joint effusions. No gross deformities Neurological: Jamie Aguirre is alert and oriented to person, place, and time. No cranial nerve deficit. Coordination normal.  Skin: Skin is warm and dry. No rash noted. No erythema.  Psychiatric: Jamie Aguirre has a dysphoric mood and affect. Jamie Aguirre behavior is normal. Judgment and thought content normal.  No results found for this basename: WBC, HGB, HCT, PLT, GLUCOSE, CHOL, TRIG, HDL, LDLDIRECT, LDLCALC, ALT, AST, NA, K, CL, CREATININE, BUN, CO2, TSH, PSA, INR, GLUF, HGBA1C, MICROALBUR       Assessment & Plan:  CPX/v70.0 - Patient has been counseled on age-appropriate routine health concerns for screening and prevention. These are reviewed and up-to-date. Immunizations are  up-to-date or declined. Labs ordered and will be reviewed.

## 2012-01-01 NOTE — Patient Instructions (Addendum)
It was good to see you today. We have reviewed your prior records including labs and tests today Call [facility] at [phone] and ask for edical records deprtent - they can help you with information from your 07/2006 stay as you requested Test(s) ordered today. Your results will be called to you after review (48-72hours after test completion). If any changes need to be made, you will be notified at that time. Health Maintenance reviewed - pneumonia vaccine updated today, consider the Shingles and tetanus vaccine, we will wait on colo and PAP, will refer for mammo - all other recommended immunizations and age-appropriate screenings are up-to-date. Medications reviewed, no changes at this time. we'll make referral to [facility] for nutrition and for mammogram. Our office will contact you regarding appointment(s) once made. Work on lifestyle changes as discussed (low fat, low carb, increased protein diet; improved exercise efforts; weight loss) to control sugar, blood pressure and cholesterol levels and/or reduce risk of developing other medical problems. Look into [email or web address] or other type of food journal to assist you in this process. Please schedule followup in 3-4 months, call sooner if problems.

## 2012-01-01 NOTE — Assessment & Plan Note (Signed)
BP Readings from Last 3 Encounters:  01/01/12 160/82   The current medical regimen is effective;  continue present plan and medications.

## 2012-01-01 NOTE — Assessment & Plan Note (Addendum)
See obesity re: education on importance of diet Reports intolerance of metformin due to pill size - other oral agents "dont work" but declines ROI from prior PCP to review check a1c now and q 35mo, adjust med as needed Follows with optho regularly On ASA, ARB and statin The current medical regimen is effective;  continue present plan and medications. No results found for this basename: HGBA1C

## 2012-01-01 NOTE — Assessment & Plan Note (Signed)
On statin - unable to tolerate higher dose due to GI side effects  Check lipids with CPX and annually  

## 2012-01-01 NOTE — Assessment & Plan Note (Signed)
Pt declines ROI from prior provider - has never seen renal in consultation Check labs now, continue ARB

## 2012-01-02 LAB — TSH: TSH: 4.83 u[IU]/mL (ref 0.35–5.50)

## 2012-01-03 ENCOUNTER — Telehealth: Payer: Self-pay | Admitting: *Deleted

## 2012-01-03 MED ORDER — CIPROFLOXACIN HCL 500 MG PO TABS: 500.0000 mg | ORAL_TABLET | Freq: Two times a day (BID) | ORAL | Status: DC

## 2012-01-03 NOTE — Telephone Encounter (Signed)
Notified pt with labs results. MD rx cipro for UTI. Sending med to [facility].... 01/02/12@11 :25am/LMB

## 2012-01-23 ENCOUNTER — Ambulatory Visit
Admission: RE | Admit: 2012-01-23 | Discharge: 2012-01-23 | Disposition: A | Discharge status: Home / Self Care | Payer: 59 | Source: Ambulatory Visit | Attending: Internal Medicine | Admitting: Internal Medicine

## 2012-01-23 DIAGNOSIS — Z0000 Encounter for general adult medical examination without abnormal findings: Secondary | ICD-10-CM

## 2012-01-23 DIAGNOSIS — Z1239 Encounter for other screening for malignant neoplasm of breast: Secondary | ICD-10-CM

## 2012-04-08 ENCOUNTER — Ambulatory Visit (INDEPENDENT_AMBULATORY_CARE_PROVIDER_SITE_OTHER): Payer: 59 | Admitting: Internal Medicine

## 2012-04-08 ENCOUNTER — Other Ambulatory Visit (INDEPENDENT_AMBULATORY_CARE_PROVIDER_SITE_OTHER): Payer: 59

## 2012-04-08 ENCOUNTER — Encounter: Payer: Self-pay | Admitting: Internal Medicine

## 2012-04-08 VITALS — BP 134/72 | HR 70 | Temp 97.9°F | Ht 64.0 in | Wt 244.8 lb

## 2012-04-08 DIAGNOSIS — G4733 Obstructive sleep apnea (adult) (pediatric): Secondary | ICD-10-CM

## 2012-04-08 DIAGNOSIS — R42 Dizziness and giddiness: Secondary | ICD-10-CM | POA: Insufficient documentation

## 2012-04-08 DIAGNOSIS — I10 Essential (primary) hypertension: Secondary | ICD-10-CM

## 2012-04-08 DIAGNOSIS — E119 Type 2 diabetes mellitus without complications: Secondary | ICD-10-CM

## 2012-04-08 LAB — HEMOGLOBIN A1C: Hgb A1c MFr Bld: 6.6 % — ABNORMAL HIGH (ref 4.6–6.5)

## 2012-04-08 NOTE — Patient Instructions (Addendum)
It was good to see you today. We have reviewed your interval history with other providers and labs and tests today Test(s) ordered today. Your results will be called to you after review (48-72hours after test completion). If any changes need to be made, you will be notified at that time. Medications reviewed, no changes at this time. we'll make referral to sleep specialist to review you CPAP treatment and mask. Our office will contact you regarding appointment(s) once made. Follow up with Dr Charlett Blake for your right foot and ankle as needed Continue to work on lifestyle changes as discussed (low fat, low carb, increased protein diet; improved exercise efforts; weight loss) to control sugar, blood pressure and cholesterol levels and/or reduce risk of developing other medical problems. Look into [email or web address] or other type of food journal to assist you in this process. Please schedule followup in 6 months for diabetes mellitus and weight check, call sooner if problems.

## 2012-04-08 NOTE — Assessment & Plan Note (Signed)
BP Readings from Last 3 Encounters:  04/08/12 134/72  01/01/12 160/82   The current medical regimen is effective;  continue present plan and medications.

## 2012-04-08 NOTE — Assessment & Plan Note (Signed)
Concerned about ill fitting mask - ?nasal mask only Will refer to sleep to eval for same and proper equipment for tx of severe dz

## 2012-04-08 NOTE — Progress Notes (Signed)
  Subjective:    Patient ID: Jamie Aguirre, female    DOB: [birth year], 66 y.o.   MRN: [id number]  HPI  Here for follow up - reviewed chronic medical issues today  Obesity - has not succeeded with prior nutrition referral or weight watcers - not interested in "pills or surgery" - recognizes constant need to eat and "unable to control myself", but denies depression overlap.  DM2 - the patient reports compliance with medication(s) as prescribed. Denies adverse side effects. Checks cbgs 2-3x/day and denies symptoms hypogylcemia  CVA hx - the patient reports compliance with medication(s) as prescribed. Denies adverse side effects.  hypertension - the patient reports compliance with medication(s) as prescribed. Denies adverse side effects.  also reviewed ongoing concerns with ENT re: vertigo and optho re: cataracts and vision changes - ?  Past Medical History  Diagnosis Date  . Asthma   . Arthritis   . Type 2 diabetes mellitus   . Diverticulitis   . Chronic bronchitis   . Headaches, cluster   . Allergic rhinitis, seasonal     assocaited with vertigo sensation  . Chronic kidney disease   . Hypertension   . Hyperlipidemia   . 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 Pellucid marginal degeneration of cornea   . Hives     idiopathic    Review of Systems  Constitutional: Negative for fever or unexpected weight change.  Respiratory: Negative for shortness of breath.   Cardiovascular: Negative for chest pain or palpitations.       Objective:   Physical Exam  BP 134/72  Pulse 70  Temp 97.9 F (36.6 C) (Oral)  Ht 5\' 4"  (1.626 m)  Wt 244 lb 12.8 oz (111.041 kg)  BMI 42.02 kg/m2  SpO2 97% Wt Readings from Last 3 Encounters:  04/08/12 244 lb 12.8 oz (111.041 kg)  01/01/12 235 lb (106.595 kg)   Constitutional: She is overweight, but appears well-developed and well-nourished. No distress. Eyes: wears glasses. Conjunctivae and EOM are normal. Pupils are equal, round, and  reactive to light. No scleral icterus.  Neck: Thick. Normal range of motion. Neck supple. No JVD present. No thyromegaly present.  Cardiovascular: Normal rate, regular rhythm and normal heart sounds.  No murmur heard. No BLE edema, but B fatty ankles. Pulmonary/Chest: Effort normal and breath sounds normal. No respiratory distress. She has no wheezes. Psychiatric: She has a dysphoric mood and affect. Her behavior is normal. Judgment and thought content normal.   Lab Results  Component Value Date   WBC 9.5 01/01/2012   HGB 12.9 01/01/2012   HCT 38.3 01/01/2012   PLT 290.0 01/01/2012   GLUCOSE 140* 01/01/2012   CHOL 163 01/01/2012   TRIG 147.0 01/01/2012   HDL 53.40 01/01/2012   LDLCALC 80 01/01/2012   ALT 17 01/01/2012   AST 18 01/01/2012   NA 138 01/01/2012   K 4.4 01/01/2012   CL 101 01/01/2012   CREATININE 1.2 01/01/2012   BUN 24* 01/01/2012   CO2 28 01/01/2012   TSH 4.83 01/01/2012   HGBA1C 6.6* 01/01/2012       Assessment & Plan:   See problem list. Medications and labs reviewed today.

## 2012-04-08 NOTE — Assessment & Plan Note (Signed)
Reviewed again importance of diet Reports intolerance of metformin due to pill size - other oral agents "dont work" check a1c now and q 52mo, adjust med as needed Follows with optho regularly On ASA, ARB and statin The current medical regimen is effective;  continue present plan and medications. Lab Results  Component Value Date   HGBA1C 6.6* 01/01/2012

## 2012-04-08 NOTE — Assessment & Plan Note (Signed)
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

## 2012-04-17 ENCOUNTER — Ambulatory Visit (INDEPENDENT_AMBULATORY_CARE_PROVIDER_SITE_OTHER): Payer: 59 | Admitting: Internal Medicine

## 2012-04-17 ENCOUNTER — Encounter: Payer: Self-pay | Admitting: Internal Medicine

## 2012-04-17 VITALS — BP 132/72 | HR 81 | Temp 97.9°F | Ht 64.0 in | Wt 244.4 lb

## 2012-04-17 DIAGNOSIS — M7582 Other shoulder lesions, left shoulder: Secondary | ICD-10-CM

## 2012-04-17 DIAGNOSIS — Z9989 Dependence on other enabling machines and devices: Secondary | ICD-10-CM

## 2012-04-17 DIAGNOSIS — G4733 Obstructive sleep apnea (adult) (pediatric): Secondary | ICD-10-CM

## 2012-04-17 DIAGNOSIS — M719 Bursopathy, unspecified: Secondary | ICD-10-CM

## 2012-04-17 DIAGNOSIS — E119 Type 2 diabetes mellitus without complications: Secondary | ICD-10-CM

## 2012-04-17 MED ORDER — SILVER SULFADIAZINE 1 % EX CREA: TOPICAL_CREAM | Freq: Every day | CUTANEOUS | Status: DC

## 2012-04-17 MED ORDER — MELOXICAM 15 MG PO TABS: 15.0000 mg | ORAL_TABLET | Freq: Every day | ORAL | Status: DC

## 2012-04-17 MED ORDER — CYCLOBENZAPRINE HCL 5 MG PO TABS: 5.0000 mg | ORAL_TABLET | Freq: Three times a day (TID) | ORAL | Status: DC | PRN

## 2012-04-17 NOTE — Progress Notes (Signed)
  Subjective:    Patient ID: Jamie Aguirre, female    DOB: 1946/04/02, 66 y.o.   MRN: [id number]  HPI  complains of L shoulder pain Onset <48h ago Denies precipitating trauma or overuse Exacerbated by overhead movement of direct pressure (lying on L side) Hx same - remote RTC bursitis, improved with exercise and time No other joint pains  Also ?re: other meds and refills - cream and prior muscle relaxers  Past Medical History  Diagnosis Date  . Asthma   . Arthritis   . Type 2 diabetes mellitus   . Diverticulitis   . Chronic bronchitis   . Headaches, cluster   . Allergic rhinitis, seasonal     assocaited with vertigo sensation  . Chronic kidney disease   . Hypertension   . Hyperlipidemia   . 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 Pellucid marginal degeneration of cornea   . Hives     idiopathic    Review of Systems  Constitutional: Negative for fever.  Respiratory: Negative for cough and shortness of breath.   Cardiovascular: Negative for chest pain and palpitations.  Neurological: Positive for dizziness. Negative for weakness and headaches.       Objective:   Physical Exam BP 132/72  Pulse 81  Temp 97.9 F (36.6 C) (Oral)  Ht 5\' 4"  (1.626 m)  Wt 244 lb 6.4 oz (110.859 kg)  BMI 41.95 kg/m2  SpO2 96% Gen: obese, HOH but NAD MSkel - L Shoulder: Full range of motion. Neurovascularly intact distally. Good strength with stress of rotator cuff but causes pain. Positive impingement signs. CV: RRR, no edema Lung: CTA B  Lab Results  Component Value Date   WBC 9.5 01/01/2012   HGB 12.9 01/01/2012   HCT 38.3 01/01/2012   PLT 290.0 01/01/2012   GLUCOSE 140* 01/01/2012   CHOL 163 01/01/2012   TRIG 147.0 01/01/2012   HDL 53.40 01/01/2012   LDLCALC 80 01/01/2012   ALT 17 01/01/2012   AST 18 01/01/2012   NA 138 01/01/2012   K 4.4 01/01/2012   CL 101 01/01/2012   CREATININE 1.2 01/01/2012   BUN 24* 01/01/2012   CO2 28 01/01/2012   TSH 4.83 01/01/2012   HGBA1C 6.6*  04/08/2012       Assessment & Plan:   L RTC bursitis/impingement - declines steroid injection - cautiously tx with short duration NSAIDs and ice/heat + hoe Pt exercises - to ortho if unimproved or worse - pt understands and agrees

## 2012-04-17 NOTE — Assessment & Plan Note (Signed)
Reviewed again importance of diet and med compliance Reports intolerance of metformin due to pill size - other oral agents "dont work"  Follows with optho regularly On ASA, ARB and statin The current medical regimen is effective;  continue present plan and medications. Lab Results  Component Value Date   HGBA1C 6.6* 04/08/2012   

## 2012-04-17 NOTE — Patient Instructions (Signed)
It was good to see you today. Use meloxicam daily for pain x 1 week, then as needed for shoulder pain Ice pack for 1st 48h, then heat as needed for relief of pain Also ok to use Tylenol with meloxicam if needed for pain Also ok to use low dose generic flexeril as needed for muscle spasm pains Your prescription(s) have been submitted to your pharmacy. Please take as directed and contact our office if you believe you are having problem(s) with the medication(s). If pain unimproved with this conservative care (medications and exercise), will need to send you to Dr Charlett Blake for other treatment options .Impingement Syndrome, Rotator Cuff, Bursitis with Rehab Impingement syndrome is a condition that involves inflammation of the tendons of the rotator cuff and the subacromial bursa, that causes pain in the shoulder. The rotator cuff consists of four tendons and muscles that control much of the shoulder and upper arm function. The subacromial bursa is a fluid filled sac that helps reduce friction between the rotator cuff and one of the bones of the shoulder (acromion). Impingement syndrome is usually an overuse injury that causes swelling of the bursa (bursitis), swelling of the tendon (tendonitis), and/or a tear of the tendon (strain). Strains are classified into three categories. Grade 1 strains cause pain, but the tendon is not lengthened. Grade 2 strains include a lengthened ligament, due to the ligament being stretched or partially ruptured. With grade 2 strains there is still function, although the function may be decreased. Grade 3 strains include a complete tear of the tendon or muscle, and function is usually impaired. SYMPTOMS    Pain around the shoulder, often at the outer portion of the upper arm.   Pain that gets worse with shoulder function, especially when reaching overhead or lifting.   Sometimes, aching when not using the arm.   Pain that wakes you up at night.   Sometimes, tenderness,  swelling, warmth, or redness over the affected area.   Loss of strength.   Limited motion of the shoulder, especially reaching behind the back (to the back pocket or to unhook bra) or across your body.   Crackling sound (crepitation) when moving the arm.   Biceps tendon pain and inflammation (in the front of the shoulder). Worse when bending the elbow or lifting.  CAUSES   Impingement syndrome is often an overuse injury, in which chronic (repetitive) motions cause the tendons or bursa to become inflamed. A strain occurs when a force is paced on the tendon or muscle that is greater than it can withstand. Common mechanisms of injury include: Stress from sudden increase in duration, frequency, or intensity of training.  Direct hit (trauma) to the shoulder.   Aging, erosion of the tendon with normal use.   Bony bump on shoulder (acromial spur).  RISK INCREASES WITH:  Contact sports (football, wrestling, boxing).   Throwing sports (baseball, tennis, volleyball).   Weightlifting and bodybuilding.   Heavy labor.   Previous injury to the rotator cuff, including impingement.   Poor shoulder strength and flexibility.   Failure to warm up properly before activity.   Inadequate protective equipment.   Old age.   Bony bump on shoulder (acromial spur).  PREVENTION    Warm up and stretch properly before activity.   Allow for adequate recovery between workouts.   Maintain physical fitness:   Strength, flexibility, and endurance.   Cardiovascular fitness.   Learn and use proper exercise technique.  PROGNOSIS   If treated properly, impingement syndrome  usually goes away within 6 weeks. Sometimes surgery is required.   RELATED COMPLICATIONS    Longer healing time if not properly treated, or if not given enough time to heal.   Recurring symptoms, that result in a chronic condition.   Shoulder stiffness, frozen shoulder, or loss of motion.   Rotator cuff tendon tear.    Recurring symptoms, especially if activity is resumed too soon, with overuse, with a direct blow, or when using poor technique.  TREATMENT   Treatment first involves the use of ice and medicine, to reduce pain and inflammation. The use of strengthening and stretching exercises may help reduce pain with activity. These exercises may be performed at home or with a therapist. If non-surgical treatment is unsuccessful after more than 6 months, surgery may be advised. After surgery and rehabilitation, activity is usually possible in 3 months.   MEDICATION  If pain medicine is needed, nonsteroidal anti-inflammatory medicines (aspirin and ibuprofen), or other minor pain relievers (acetaminophen), are often advised.   Do not take pain medicine for 7 days before surgery.   Prescription pain relievers may be given, if your caregiver thinks they are needed. Use only as directed and only as much as you need.   Corticosteroid injections may be given by your caregiver. These injections should be reserved for the most serious cases, because they may only be given a certain number of times.  HEAT AND COLD  Cold treatment (icing) should be applied for 10 to 15 minutes every 2 to 3 hours for inflammation and pain, and immediately after activity that aggravates your symptoms. Use ice packs or an ice massage.   Heat treatment may be used before performing stretching and strengthening activities prescribed by your caregiver, physical therapist, or athletic trainer. Use a heat pack or a warm water soak.  SEEK MEDICAL CARE IF:    Symptoms get worse or do not improve in 4 to 6 weeks, despite treatment.   New, unexplained symptoms develop. (Drugs used in treatment may produce side effects.)  EXERCISES   RANGE OF MOTION (ROM) AND STRETCHING EXERCISES - Impingement Syndrome (Rotator Cuff  Tendinitis, Bursitis) These exercises may help you when beginning to rehabilitate your injury. Your symptoms may go away with  or without further involvement from your physician, physical therapist or athletic trainer. While completing these exercises, remember:    Restoring tissue flexibility helps normal motion to return to the joints. This allows healthier, less painful movement and activity.   An effective stretch should be held for at least 30 seconds.   A stretch should never be painful. You should only feel a gentle lengthening or release in the stretched tissue.  STRETCH - Flexion, Standing  Stand with good posture. With an underhand grip on your right / left hand, and an overhand grip on the opposite hand, grasp a broomstick or cane so that your hands are a little more than shoulder width apart.   Keeping your right / left elbow straight and shoulder muscles relaxed, push the stick with your opposite hand, to raise your right / left arm in front of your body and then overhead. Raise your arm until you feel a stretch in your right / left shoulder, but before you have increased shoulder pain.   Try to avoid shrugging your right / left shoulder as your arm rises, by keeping your shoulder blade tucked down and toward your mid-back spine. Hold for __________ seconds.   Slowly return to the starting position.  Repeat  __________ times. Complete this exercise __________ times per day. STRETCH - Abduction, Supine  Lie on your back. With an underhand grip on your right / left hand and an overhand grip on the opposite hand, grasp a broomstick or cane so that your hands are a little more than shoulder width apart.   Keeping your right / left elbow straight and your shoulder muscles relaxed, push the stick with your opposite hand, to raise your right / left arm out to the side of your body and then overhead. Raise your arm until you feel a stretch in your right / left shoulder, but before you have increased shoulder pain.   Try to avoid shrugging your right / left shoulder as your arm rises, by keeping your shoulder blade  tucked down and toward your mid-back spine. Hold for __________ seconds.   Slowly return to the starting position.  Repeat __________ times. Complete this exercise __________ times per day. ROM - Flexion, Active-Assisted  Lie on your back. You may bend your knees for comfort.   Grasp a broomstick or cane so your hands are about shoulder width apart. Your right / left hand should grip the end of the stick, so that your hand is positioned "thumbs-up," as if you were about to shake hands.   Using your healthy arm to lead, raise your right / left arm overhead, until you feel a gentle stretch in your shoulder. Hold for __________ seconds.   Use the stick to assist in returning your right / left arm to its starting position.  Repeat __________ times. Complete this exercise __________ times per day.   ROM - Internal Rotation, Supine   Lie on your back on a firm surface. Place your right / left elbow about 60 degrees away from your side. Elevate your elbow with a folded towel, so that the elbow and shoulder are the same height.   Using a broomstick or cane and your strong arm, pull your right / left hand toward your body until you feel a gentle stretch, but no increase in your shoulder pain. Keep your shoulder and elbow in place throughout the exercise.   Hold for __________ seconds. Slowly return to the starting position.  Repeat __________ times. Complete this exercise __________ times per day. STRETCH - Internal Rotation  Place your right / left hand behind your back, palm up.   Throw a towel or belt over your opposite shoulder. Grasp the towel with your right / left hand.   While keeping an upright posture, gently pull up on the towel, until you feel a stretch in the front of your right / left shoulder.   Avoid shrugging your right / left shoulder as your arm rises, by keeping your shoulder blade tucked down and toward your mid-back spine.   Hold for __________ seconds. Release the  stretch, by lowering your healthy hand.  Repeat __________ times. Complete this exercise __________ times per day. ROM - Internal Rotation   Using an underhand grip, grasp a stick behind your back with both hands.   While standing upright with good posture, slide the stick up your back until you feel a mild stretch in the front of your shoulder.   Hold for __________ seconds. Slowly return to your starting position.  Repeat __________ times. Complete this exercise __________ times per day.   STRETCH - Posterior Shoulder Capsule   Stand or sit with good posture. Grasp your right / left elbow and draw it across your chest,  keeping it at the same height as your shoulder.   Pull your elbow, so your upper arm comes in closer to your chest. Pull until you feel a gentle stretch in the back of your shoulder.   Hold for __________ seconds.  Repeat __________ times. Complete this exercise __________ times per day. STRENGTHENING EXERCISES - Impingement Syndrome (Rotator Cuff Tendinitis, Bursitis) These exercises may help you when beginning to rehabilitate your injury. They may resolve your symptoms with or without further involvement from your physician, physical therapist or athletic trainer. While completing these exercises, remember:  Muscles can gain both the endurance and the strength needed for everyday activities through controlled exercises.   Complete these exercises as instructed by your physician, physical therapist or athletic trainer. Increase the resistance and repetitions only as guided.   You may experience muscle soreness or fatigue, but the pain or discomfort you are trying to eliminate should never worsen during these exercises. If this pain does get worse, stop and make sure you are following the directions exactly. If the pain is still present after adjustments, discontinue the exercise until you can discuss the trouble with your clinician.   During your recovery, avoid activity  or exercises which involve actions that place your injured hand or elbow above your head or behind your back or head. These positions stress the tissues which you are trying to heal.  STRENGTH - Scapular Depression and Adduction   With good posture, sit on a firm chair. Support your arms in front of you, with pillows, arm rests, or on a table top. Have your elbows in line with the sides of your body.   Gently draw your shoulder blades down and toward your mid-back spine. Gradually increase the tension, without tensing the muscles along the top of your shoulders and the back of your neck.   Hold for __________ seconds. Slowly release the tension and relax your muscles completely before starting the next repetition.   After you have practiced this exercise, remove the arm support and complete the exercise in standing as well as sitting position.  Repeat __________ times. Complete this exercise __________ times per day.   STRENGTH - Shoulder Abductors, Isometric  With good posture, stand or sit about 4-6 inches from a wall, with your right / left side facing the wall.   Bend your right / left elbow. Gently press your right / left elbow into the wall. Increase the pressure gradually, until you are pressing as hard as you can, without shrugging your shoulder or increasing any shoulder discomfort.   Hold for __________ seconds.   Release the tension slowly. Relax your shoulder muscles completely before you begin the next repetition.  Repeat __________ times. Complete this exercise __________ times per day.   STRENGTH - External Rotators, Isometric  Keep your right / left elbow at your side and bend it 90 degrees.   Step into a door frame so that the outside of your right / left wrist can press against the door frame without your upper arm leaving your side.   Gently press your right / left wrist into the door frame, as if you were trying to swing the back of your hand away from your stomach.  Gradually increase the tension, until you are pressing as hard as you can, without shrugging your shoulder or increasing any shoulder discomfort.   Hold for __________ seconds.   Release the tension slowly. Relax your shoulder muscles completely before you begin the next repetition.  Repeat __________ times. Complete this exercise __________ times per day.   STRENGTH - Supraspinatus   Stand or sit with good posture. Grasp a __________ weight, or an exercise band or tubing, so that your hand is "thumbs-up," like you are shaking hands.   Slowly lift your right / left arm in a "V" away from your thigh, diagonally into the space between your side and straight ahead. Lift your hand to shoulder height or as far as you can, without increasing any shoulder pain. At first, many people do not lift their hands above shoulder height.   Avoid shrugging your right / left shoulder as your arm rises, by keeping your shoulder blade tucked down and toward your mid-back spine.   Hold for __________ seconds. Control the descent of your hand, as you slowly return to your starting position.  Repeat __________ times. Complete this exercise __________ times per day.   STRENGTH - External Rotators  Secure a rubber exercise band or tubing to a fixed object (table, pole) so that it is at the same height as your right / left elbow when you are standing or sitting on a firm surface.   Stand or sit so that the secured exercise band is at your uninjured side.   Bend your right / left elbow 90 degrees. Place a folded towel or small pillow under your right / left arm, so that your elbow is a few inches away from your side.   Keeping the tension on the exercise band, pull it away from your body, as if pivoting on your elbow. Be sure to keep your body steady, so that the movement is coming only from your rotating shoulder.   Hold for __________ seconds. Release the tension in a controlled manner, as you return to the  starting position.  Repeat __________ times. Complete this exercise __________ times per day.   STRENGTH - Internal Rotators   Secure a rubber exercise band or tubing to a fixed object (table, pole) so that it is at the same height as your right / left elbow when you are standing or sitting on a firm surface.   Stand or sit so that the secured exercise band is at your right / left side.   Bend your elbow 90 degrees. Place a folded towel or small pillow under your right / left arm so that your elbow is a few inches away from your side.   Keeping the tension on the exercise band, pull it across your body, toward your stomach. Be sure to keep your body steady, so that the movement is coming only from your rotating shoulder.   Hold for __________ seconds. Release the tension in a controlled manner, as you return to the starting position.  Repeat __________ times. Complete this exercise __________ times per day.   STRENGTH - Scapular Protractors, Standing   Stand arms length away from a wall. Place your hands on the wall, keeping your elbows straight.   Begin by dropping your shoulder blades down and toward your mid-back spine.   To strengthen your protractors, keep your shoulder blades down, but slide them forward on your rib cage. It will feel as if you are lifting the back of your rib cage away from the wall. This is a subtle motion and can be challenging to complete. Ask your caregiver for further instruction, if you are not sure you are doing the exercise correctly.   Hold for __________ seconds. Slowly return to the starting position, resting  the muscles completely before starting the next repetition.  Repeat __________ times. Complete this exercise __________ times per day. STRENGTH - Scapular Protractors, Supine  Lie on your back on a firm surface. Extend your right / left arm straight into the air while holding a __________ weight in your hand.   Keeping your head and back in place,  lift your shoulder off the floor.   Hold for __________ seconds. Slowly return to the starting position, and allow your muscles to relax completely before starting the next repetition.  Repeat __________ times. Complete this exercise __________ times per day. STRENGTH - Scapular Protractors, Quadruped  Get onto your hands and knees, with your shoulders directly over your hands (or as close as you can be, comfortably).   Keeping your elbows locked, lift the back of your rib cage up into your shoulder blades, so your mid-back rounds out. Keep your neck muscles relaxed.   Hold this position for __________ seconds. Slowly return to the starting position and allow your muscles to relax completely before starting the next repetition.  Repeat __________ times. Complete this exercise __________ times per day.   STRENGTH - Scapular Retractors  Secure a rubber exercise band or tubing to a fixed object (table, pole), so that it is at the height of your shoulders when you are either standing, or sitting on a firm armless chair.   With a palm down grip, grasp an end of the band in each hand. Straighten your elbows and lift your hands straight in front of you, at shoulder height. Step back, away from the secured end of the band, until it becomes tense.   Squeezing your shoulder blades together, draw your elbows back toward your sides, as you bend them. Keep your upper arms lifted away from your body throughout the exercise.   Hold for __________ seconds. Slowly ease the tension on the band, as you reverse the directions and return to the starting position.  Repeat __________ times. Complete this exercise __________ times per day. STRENGTH - Shoulder Extensors   Secure a rubber exercise band or tubing to a fixed object (table, pole) so that it is at the height of your shoulders when you are either standing, or sitting on a firm armless chair.   With a thumbs-up grip, grasp an end of the band in each hand.  Straighten your elbows and lift your hands straight in front of you, at shoulder height. Step back, away from the secured end of the band, until it becomes tense.   Squeezing your shoulder blades together, pull your hands down to the sides of your thighs. Do not allow your hands to go behind you.   Hold for __________ seconds. Slowly ease the tension on the band, as you reverse the directions and return to the starting position.  Repeat __________ times. Complete this exercise __________ times per day.   STRENGTH - Scapular Retractors and External Rotators   Secure a rubber exercise band or tubing to a fixed object (table, pole) so that it is at the height as your shoulders, when you are either standing, or sitting on a firm armless chair.   With a palm down grip, grasp an end of the band in each hand. Bend your elbows 90 degrees and lift your elbows to shoulder height, at your sides. Step back, away from the secured end of the band, until it becomes tense.   Squeezing your shoulder blades together, rotate your shoulders so that your upper arms and  elbows remain stationary, but your fists travel upward to head height.   Hold for __________ seconds. Slowly ease the tension on the band, as you reverse the directions and return to the starting position.  Repeat __________ times. Complete this exercise __________ times per day.   STRENGTH - Scapular Retractors and External Rotators, Rowing   Secure a rubber exercise band or tubing to a fixed object (table, pole) so that it is at the height of your shoulders, when you are either standing, or sitting on a firm armless chair.   With a palm down grip, grasp an end of the band in each hand. Straighten your elbows and lift your hands straight in front of you, at shoulder height. Step back, away from the secured end of the band, until it becomes tense.   Step 1: Squeeze your shoulder blades together. Bending your elbows, draw your hands to your chest, as  if you are rowing a boat. At the end of this motion, your hands and elbow should be at shoulder height and your elbows should be out to your sides.   Step 2: Rotate your shoulders, to raise your hands above your head. Your forearms should be vertical and your upper arms should be horizontal.   Hold for __________ seconds. Slowly ease the tension on the band, as you reverse the directions and return to the starting position.  Repeat __________ times. Complete this exercise __________ times per day.   STRENGTH - Scapular Depressors  Find a sturdy chair without wheels, such as a dining room chair.   Keeping your feet on the floor, and your hands on the chair arms, lift your bottom up from the seat, and lock your elbows.   Keeping your elbows straight, allow gravity to pull your body weight down. Your shoulders will rise toward your ears.   Raise your body against gravity by drawing your shoulder blades down your back, shortening the distance between your shoulders and ears. Although your feet should always maintain contact with the floor, your feet should progressively support less body weight, as you get stronger.   Hold for __________ seconds. In a controlled and slow manner, lower your body weight to begin the next repetition.  Repeat __________ times. Complete this exercise __________ times per day.   Document Released: 07/31/2005 Document Revised: 07/20/2011 Document Reviewed: 11/12/2008 [facility] Patient Information 2012 [place], [place].

## 2012-04-17 NOTE — Assessment & Plan Note (Signed)
Concerned about ill fitting mask - ?nasal mask only Pending [place] sleep to eval with Dr Mendel Corning for tx including proper equipment for tx of severe dz

## 2012-05-01 ENCOUNTER — Institutional Professional Consult (permissible substitution): Payer: 59 | Admitting: Pulmonary Disease

## 2012-07-03 ENCOUNTER — Ambulatory Visit (INDEPENDENT_AMBULATORY_CARE_PROVIDER_SITE_OTHER): Payer: Medicare Other | Admitting: Pulmonary Disease

## 2012-07-03 ENCOUNTER — Encounter: Payer: Self-pay | Admitting: Pulmonary Disease

## 2012-07-03 VITALS — BP 130/70 | HR 83 | Temp 97.9°F | Ht 64.0 in | Wt 250.8 lb

## 2012-07-03 DIAGNOSIS — G4733 Obstructive sleep apnea (adult) (pediatric): Secondary | ICD-10-CM

## 2012-07-03 NOTE — Assessment & Plan Note (Signed)
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  She still has some sleep pressure during the day, and I am not sure her pressure is adequate given his severely of her sleep apnea.  I would like to get a download off her device to check for mask leaks, and also to get an idea if her pressure is controlling her sleep apnea.  I have also asked her to keep up with mask changes and supplies, and to work on weight loss.

## 2012-07-03 NOTE — Progress Notes (Signed)
  Subjective:    Patient ID: Jamie Aguirre, female    DOB: [birth year], 66 y.o.   MRN: [id number]  HPI The patient is a 66 year old female who had been asked to see for management of obstructive sleep apnea.  He was diagnosed in March of this year with moderate to severe OSA, with an AHI of 34 events per hour.  She has been started on CPAP with a nasal mask, and feels that her sleep and daytime alertness are much improved.  She feels rested in the mornings upon arising, but does have some sleep pressure during the day with inactivity.  She does have some issues with mouth open at times.  A download is currently not available for review.  The patient's Epworth sleepiness score today is only one.  Sleep Questionnaire: What time do you typically go to bed?( Between what hours) 10-11 pm How long does it take you to fall asleep? a minute How many times during the night do you wake up? 0 What time do you get out of bed to start your day? Do you drive or operate heavy machinery in your occupation? No How much has your weight changed (up or down) over the past two years? (In pounds) Have you ever had a sleep study before? Yes If yes, location of study? yes If yes, date of study? not sure Do you currently use CPAP? Yes If so, what pressure? not sure Do you wear oxygen at any time? No    Review of Systems  Constitutional: Negative for fever and unexpected weight change.  HENT: Positive for congestion, rhinorrhea and trouble swallowing. Negative for ear pain, nosebleeds, sore throat ( issues at times ), sneezing, dental problem, postnasal drip and sinus pressure.   Eyes: Positive for redness and itching.  Respiratory: Positive for cough, shortness of breath and wheezing. Negative for chest tightness.   Cardiovascular: Positive for leg swelling. Negative for palpitations.  Gastrointestinal: Negative for nausea and vomiting.  Genitourinary: Negative for dysuria.  Musculoskeletal: Positive for joint swelling.    Skin: Negative for rash.  Neurological: Positive for headaches.  Hematological: Bruises/bleeds easily.  Psychiatric/Behavioral: Negative for dysphoric mood. The patient is nervous/anxious.        Objective:   Physical Exam Constitutional:  Obese female, no acute distress  HENT:  Nares patent without discharge  Oropharynx without exudate, palate and uvula are elongated  Eyes:  Perrla, eomi, no scleral icterus  Neck:  No JVD, no TMG  Cardiovascular:  Normal rate, regular rhythm, no rubs or gallops.  No murmurs        Intact distal pulses  Pulmonary :  Normal breath sounds, no stridor or respiratory distress   No rhonchi, or wheezing, +mild basilar crackles.   Abdominal:  Soft, nondistended, bowel sounds present.  No tenderness noted.   Musculoskeletal:  mild lower extremity edema noted.  Lymph Nodes:  No cervical lymphadenopathy noted  Skin:  No cyanosis noted  Neurologic:  Alert, appropriate, moves all 4 extremities without obvious deficit.         Assessment & Plan:

## 2012-07-03 NOTE — Patient Instructions (Addendum)
Will get a download off your machine to make sure mask is sealing well enough and your pressure is adequate. Work on weight loss Keep up with mask changes and supplies. followup with me in one year if doing well.

## 2012-07-17 ENCOUNTER — Telehealth: Payer: Self-pay | Admitting: Pulmonary Disease

## 2012-07-17 NOTE — Telephone Encounter (Signed)
Pt returned call.  Pt was last seen by KC 11.20.13 and KC attempted to do a DL but was unable to do so at time of ov.  Pt would like to know if this device is now working so that she may bring her chip in.  Advised pt that yes, device is now working and she will bring her chip in tomorrow for DL.  Will sign off and forward to [facility] as FYI.

## 2012-07-17 NOTE — Telephone Encounter (Signed)
ATC the pt, NA and no option to leave a msg, WCB 

## 2012-07-19 ENCOUNTER — Telehealth: Payer: Self-pay | Admitting: *Deleted

## 2012-07-19 ENCOUNTER — Encounter: Payer: Self-pay | Admitting: Internal Medicine

## 2012-07-19 ENCOUNTER — Ambulatory Visit (INDEPENDENT_AMBULATORY_CARE_PROVIDER_SITE_OTHER)
Admission: RE | Admit: 2012-07-19 | Discharge: 2012-07-19 | Disposition: A | Discharge status: Home / Self Care | Payer: Medicare Other | Source: Ambulatory Visit | Attending: Internal Medicine | Admitting: Internal Medicine

## 2012-07-19 ENCOUNTER — Ambulatory Visit (INDEPENDENT_AMBULATORY_CARE_PROVIDER_SITE_OTHER): Payer: Medicare Other | Admitting: Internal Medicine

## 2012-07-19 VITALS — BP 132/78 | HR 77 | Temp 98.5°F | Ht 64.0 in | Wt 246.8 lb

## 2012-07-19 DIAGNOSIS — J209 Acute bronchitis, unspecified: Secondary | ICD-10-CM

## 2012-07-19 DIAGNOSIS — I10 Essential (primary) hypertension: Secondary | ICD-10-CM

## 2012-07-19 DIAGNOSIS — E119 Type 2 diabetes mellitus without complications: Secondary | ICD-10-CM

## 2012-07-19 MED ORDER — HYDROCODONE-HOMATROPINE 5-1.5 MG/5ML PO SYRP: 5.0000 mL | ORAL_SOLUTION | Freq: Four times a day (QID) | ORAL | Status: DC | PRN

## 2012-07-19 MED ORDER — BENZONATATE 200 MG PO CAPS: 200.0000 mg | ORAL_CAPSULE | Freq: Three times a day (TID) | ORAL | Status: DC | PRN

## 2012-07-19 MED ORDER — AZITHROMYCIN 250 MG PO TABS: ORAL_TABLET | ORAL | Status: DC

## 2012-07-19 NOTE — Telephone Encounter (Signed)
Download is in your folder for review.   Message to be forwarded to [facility] as FYI.   Dr Shelle Iron please advise. Thanks

## 2012-07-19 NOTE — Progress Notes (Signed)
  Subjective:    Patient ID: Jamie Aguirre, female    DOB: Aug 10, 1946, 66 y.o.   MRN: [id number]  HPI  complains of cough Worse than usual for last 10 days No fever but increase chest congestion Thick yellow green sputum  Past Medical History  Diagnosis Date  . Asthma   . Arthritis   . Type 2 diabetes mellitus   . Diverticulitis   . Chronic bronchitis   . Headaches, cluster   . Allergic rhinitis, seasonal     assocaited with vertigo sensation  . Chronic kidney disease   . Hypertension   . Hyperlipidemia   . 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 Pellucid marginal degeneration of cornea   . Hives     idiopathic    Review of Systems  Constitutional: Positive for fatigue. Negative for chills.  Respiratory: Positive for cough and wheezing. Negative for choking, chest tightness and shortness of breath.   Cardiovascular: Negative for chest pain and palpitations.  Musculoskeletal: Positive for myalgias and arthralgias.  Neurological: Positive for headaches.       Objective:   Physical Exam BP 132/78  Pulse 77  Temp 98.5 F (36.9 C) (Oral)  Ht 5\' 4"  (1.626 m)  Wt 246 lb 12.8 oz (111.948 kg)  BMI 42.36 kg/m2  SpO2 93% Wt Readings from Last 3 Encounters:  07/19/12 246 lb 12.8 oz (111.948 kg)  07/03/12 250 lb 12.8 oz (113.762 kg)  04/17/12 244 lb 6.4 oz (110.859 kg)   Constitutional: She is overweight, but appears well-developed and well-nourished. No distress.  HENT: Head: Normocephalic and atraumatic. Sinus non tender Ears: B TMs ok, no erythema or effusion; Nose: Nose normal. Mouth/Throat: Oropharynx is red, but clear and moist. No oropharyngeal exudate.  Eyes: Conjunctivae and EOM are normal. Pupils are equal, round, and reactive to light. No scleral icterus.  Neck: Normal range of motion. Neck supple. Mild LAD. No JVD present. No thyromegaly present.  Cardiovascular: Normal rate, regular rhythm and normal heart sounds.  No murmur heard. chronic 1+ BLE  edema. Pulmonary/Chest: Effort normal -breath sounds diminished at bases. No respiratory distress. She has no wheezes, but cough spasm induced by deep inspiration.  Psychiatric: She has a dysphoric and mildly anxious mood and affect. Her behavior is normal. Judgment and thought content normal.   Lab Results  Component Value Date   WBC 9.5 01/01/2012   HGB 12.9 01/01/2012   HCT 38.3 01/01/2012   PLT 290.0 01/01/2012   GLUCOSE 140* 01/01/2012   CHOL 163 01/01/2012   TRIG 147.0 01/01/2012   HDL 53.40 01/01/2012   LDLCALC 80 01/01/2012   ALT 17 01/01/2012   AST 18 01/01/2012   NA 138 01/01/2012   K 4.4 01/01/2012   CL 101 01/01/2012   CREATININE 1.2 01/01/2012   BUN 24* 01/01/2012   CO2 28 01/01/2012   TSH 4.83 01/01/2012   HGBA1C 6.6* 04/08/2012       Assessment & Plan:   Acute on chronic bronchitis  Check CXR Start Zpak and prescription cough suppression Continue Rescue MDI prn

## 2012-07-19 NOTE — Telephone Encounter (Signed)
Notified pt with md response.../lmb 

## 2012-07-19 NOTE — Assessment & Plan Note (Signed)
BP Readings from Last 3 Encounters:  07/19/12 132/78  07/03/12 130/70  04/17/12 132/72   The current medical regimen is effective;  continue present plan and medications.

## 2012-07-19 NOTE — Patient Instructions (Signed)
It was good to see you today. Test(s) ordered today. Your results will be released to MyChart (or called to you) after review, usually within 72hours after test completion. If any changes need to be made, you will be notified at that same time. Zpak antibiotics and prescription cough syrup - Your prescription(s) have been submitted to your pharmacy. Please take as directed and contact our office if you believe you are having problem(s) with the medication(s). Alternate between ibuprofen and tylenol for aches, pain and fever symptoms as discussed Hydrate, rest and call if worse or unimproved

## 2012-07-19 NOTE — Assessment & Plan Note (Signed)
Reviewed again importance of diet and med compliance Reports intolerance of metformin due to pill size - other oral agents "dont workScientist, water quality with optho regularly On ASA, ARB and statin The current medical regimen is effective;  continue present plan and medications. Lab Results  Component Value Date   HGBA1C 6.6* 04/08/2012   Will avoid steroids at this time unless unimproved with symptomatic and anxiety therapy

## 2012-07-19 NOTE — Telephone Encounter (Signed)
Noted, allergy list now updated to reflect same. Please notify her regarding chest x-ray findings (clear) Continue antibiotics and try over-the-counter cough medications first If still unimproved, okay to use Tessalon Perles as needed, prescription electronically sent

## 2012-07-19 NOTE — Telephone Encounter (Signed)
Pt called and stated she was allergic to cough syrup md rx. Requesting something else...[name]

## 2012-07-22 NOTE — Telephone Encounter (Signed)
Pt notified of KC recs and will call if having any problems.

## 2012-07-22 NOTE — Telephone Encounter (Signed)
Let pt know that her download shows she wore cpap more than 4 hours 71% of the time over the last 6mos. There were no signficant mask leaks, and her current pressure of 8cm appears to control her sleep apnea by the download.  I am still a little suspicious given the severity of her sleep apnea, that 8cm of pressure is not adequate.  I would like her to try wearing more than 4 hours more consistently, and see if her symptoms improve.  If they do not, she is to call us so that we can re-optimize her pressure at home again.

## 2012-10-09 ENCOUNTER — Other Ambulatory Visit (INDEPENDENT_AMBULATORY_CARE_PROVIDER_SITE_OTHER): Payer: 59

## 2012-10-09 ENCOUNTER — Other Ambulatory Visit: Payer: Self-pay | Admitting: Internal Medicine

## 2012-10-09 ENCOUNTER — Ambulatory Visit (INDEPENDENT_AMBULATORY_CARE_PROVIDER_SITE_OTHER): Payer: 59 | Admitting: Internal Medicine

## 2012-10-09 ENCOUNTER — Encounter: Payer: Self-pay | Admitting: Internal Medicine

## 2012-10-09 VITALS — BP 152/90 | HR 80 | Temp 97.2°F | Ht 64.0 in | Wt 242.4 lb

## 2012-10-09 DIAGNOSIS — F329 Major depressive disorder, single episode, unspecified: Secondary | ICD-10-CM

## 2012-10-09 DIAGNOSIS — E119 Type 2 diabetes mellitus without complications: Secondary | ICD-10-CM

## 2012-10-09 DIAGNOSIS — R42 Dizziness and giddiness: Secondary | ICD-10-CM

## 2012-10-09 DIAGNOSIS — I10 Essential (primary) hypertension: Secondary | ICD-10-CM

## 2012-10-09 DIAGNOSIS — F3289 Other specified depressive episodes: Secondary | ICD-10-CM | POA: Insufficient documentation

## 2012-10-09 MED ORDER — SILVER SULFADIAZINE 1 % EX CREA: TOPICAL_CREAM | CUTANEOUS | Status: AC | PRN

## 2012-10-09 MED ORDER — PAROXETINE HCL 10 MG PO TABS: 10.0000 mg | ORAL_TABLET | ORAL | Status: DC

## 2012-10-09 MED ORDER — DIAZEPAM 5 MG PO TABS: 2.5000 mg | ORAL_TABLET | Freq: Two times a day (BID) | ORAL | Status: DC | PRN

## 2012-10-09 NOTE — Patient Instructions (Signed)
It was good to see you today. We have reviewed your interval history with other providers and labs and tests today Test(s) ordered today. Your results will be released to MyChart (or called to you) after review, usually within 72hours after test completion. If any changes need to be made, you will be notified at that same time. Medications reviewed and updated, try Valium as needed for vertigo symptoms and start low-dose generic Paxil for depression symptoms Your prescription(s) and refills have been submitted to your pharmacy. Please take as directed and contact our office if you believe you are having problem(s) with the medication(s). Follow up with Dr Charlett Blake for your right foot and ankle as needed Continue to work on lifestyle changes as discussed (low fat, low carb, increased protein diet; improved exercise efforts; weight loss) to control sugar, blood pressure and cholesterol levels and/or reduce risk of developing other medical problems. Look into [email or web address] or other type of food journal to assist you in this process. Please schedule followup in 6 weeks for medication and mood recheck, call sooner if problems.  Depression, Adult Depression refers to feeling sad, low, down in the dumps, blue, gloomy, or empty. In general, there are two kinds of depression: 1. Depression that we all experience from time to time because of upsetting life experiences, including the loss of a job or the ending of a relationship (normal sadness or normal grief). This kind of depression is considered normal, is short lived, and resolves within a few days to 2 weeks. (Depression experienced after the loss of a loved one is called bereavement. Bereavement often lasts longer than 2 weeks but normally gets better with time.) 2. Clinical depression, which lasts longer than normal sadness or normal grief or interferes with your ability to function at home, at work, and in school. It also interferes with your personal  relationships. It affects almost every aspect of your life. Clinical depression is an illness. Symptoms of depression also can be caused by conditions other than normal sadness and grief or clinical depression. Examples of these conditions are listed as follows:  Physical illness Some physical illnesses, including underactive thyroid gland (hypothyroidism), severe anemia, specific types of cancer, diabetes, uncontrolled seizures, heart and lung problems, strokes, and chronic pain are commonly associated with symptoms of depression.  Side effects of some prescription medicine In some people, certain types of prescription medicine can cause symptoms of depression.  Substance abuse Abuse of alcohol and illicit drugs can cause symptoms of depression. SYMPTOMS Symptoms of normal sadness and normal grief include the following:  Feeling sad or crying for short periods of time.  Not caring about anything (apathy).  Difficulty sleeping or sleeping too much.  No longer able to enjoy the things you used to enjoy.  Desire to be by oneself all the time (social isolation).  Lack of energy or motivation.  Difficulty concentrating or remembering.  Change in appetite or weight.  Restlessness or agitation. Symptoms of clinical depression include the same symptoms of normal sadness or normal grief and also the following symptoms:  Feeling sad or crying all the time.  Feelings of guilt or worthlessness.  Feelings of hopelessness or helplessness.  Thoughts of suicide or the desire to harm yourself (suicidal ideation).  Loss of touch with reality (psychotic symptoms). Seeing or hearing things that are not real (hallucinations) or having false beliefs about your life or the people around you (delusions and paranoia). DIAGNOSIS  The diagnosis of clinical depression usually is based on the  severity and duration of the symptoms. Your caregiver also will ask you questions about your medical history and  substance use to find out if physical illness, use of prescription medicine, or substance abuse is causing your depression. Your caregiver also may order blood tests. TREATMENT  Typically, normal sadness and normal grief do not require treatment. However, sometimes antidepressant medicine is prescribed for bereavement to ease the depressive symptoms until they resolve. The treatment for clinical depression depends on the severity of your symptoms but typically includes antidepressant medicine, counseling with a mental health professional, or a combination of both. Your caregiver will help to determine what treatment is best for you. Depression caused by physical illness usually goes away with appropriate medical treatment of the illness. If prescription medicine is causing depression, talk with your caregiver about stopping the medicine, decreasing the dose, or substituting another medicine. Depression caused by abuse of alcohol or illicit drugs abuse goes away with abstinence from these substances. Some adults need professional help in order to stop drinking or using drugs. SEEK IMMEDIATE CARE IF:  You have thoughts about hurting yourself or others.  You lose touch with reality (have psychotic symptoms).  You are taking medicine for depression and have a serious side effect. FOR MORE INFORMATION National Alliance on Mental Illness: www.nami.[employer]: http://www.maynard.net/ Document Released: 07/28/2000 Document Revised: 01/30/2012 Document Reviewed: 10/30/2011 [facility] Patient Information 2013 [place], [place].

## 2012-10-09 NOTE — Assessment & Plan Note (Signed)
Low-grade symptoms of dysthymia, benefits of increasing depression symptoms and episode of panic attack Previously counseling, declines return to same Discussed medication options with management, agreeable starting low-dose generic Paxil now we reviewed potential risk/benefit and possible side effects - pt understands and agrees to same  Followup in 6-8 weeks on medication symptoms, patient to call sooner if problems

## 2012-10-09 NOTE — Assessment & Plan Note (Signed)
BP Readings from Last 3 Encounters:  10/09/12 152/90  07/19/12 132/78  07/03/12 130/70   The current medical regimen is generally effective;  continue present plan and medications.

## 2012-10-09 NOTE — Assessment & Plan Note (Signed)
Reviewed again importance of diet and med compliance Reports intolerance of metformin due to pill size - other oral agents "dont workScientist, water quality with optho regularly On ASA, ARB and statin The current medical regimen is effective;  continue present plan and medications. Lab Results  Component Value Date   HGBA1C 6.6* 04/08/2012

## 2012-10-09 NOTE — Assessment & Plan Note (Signed)
Chronic - Hx Meniere's -  Progressive hearing loss on R reviewed -  Working with ENT Jamie Aguirre) on same Previously intolerant of meclizine because of side effects try low-dose Valium as needed -new rx done

## 2012-10-09 NOTE — Progress Notes (Signed)
Subjective:    Patient ID: Jamie Aguirre, female    DOB: 09-11-1945, 67 y.o.   MRN: [id number]  HPI  Here for follow up - reviewed chronic medical issues today  Obesity - has not succeeded with prior nutrition referral or weight watcers - not interested in "pills or surgery" - recognizes constant need to eat and "unable to control myself", but denies depression overlap.  DM2 - the patient reports compliance with medication(s) as prescribed. Denies adverse side effects. Checks cbgs 2-3x/day and denies symptoms hypogylcemia  CVA hx - the patient reports compliance with medication(s) as prescribed. Denies adverse side effects.  hypertension - the patient reports compliance with medication(s) as prescribed. Denies adverse side effects.  Reports increasing symptoms depression over past 8 weeks - episode of panic attack last month - remotely in counseling - ?med options - denies si/hi  Past Medical History  Diagnosis Date  . Asthma   . Arthritis   . Type 2 diabetes mellitus   . Diverticulitis   . Chronic bronchitis   . Headaches, cluster   . Allergic rhinitis, seasonal     assocaited with vertigo sensation  . Chronic kidney disease   . Hypertension   . Hyperlipidemia   . 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 Pellucid marginal degeneration of cornea   . Hives     idiopathic   Family History  Problem Relation Age of Onset  . Arthritis Other   . Stroke Other   . Hypertension Other   . Hyperlipidemia Other   . Diabetes Other    History  Substance Use Topics  . Smoking status: Former Smoker -- 3.50 packs/day for 20 years    Types: Cigarettes    Quit date: 08/15/1975  . Smokeless tobacco: Never Used  . Alcohol Use: No    Review of Systems  Constitutional: Negative for fever or weight change.  Respiratory: Negative for cough and shortness of breath.   Cardiovascular: Negative for chest pain or palpitations.  Gastrointestinal: Negative for abdominal pain, no bowel  changes.  Musculoskeletal: Negative for gait problem or joint swelling.  Skin: Negative for rash.  Neurological: Negative for dizziness or headache.  No other specific complaints in a complete review of systems (except as listed in HPI above).      Objective:   Physical Exam  BP 152/90  Pulse 80  Temp(Src) 97.2 F (36.2 C) (Oral)  Ht 5\' 4"  (1.626 m)  Wt 242 lb 6.4 oz (109.952 kg)  BMI 41.59 kg/m2  SpO2 97% Wt Readings from Last 3 Encounters:  10/09/12 242 lb 6.4 oz (109.952 kg)  07/19/12 246 lb 12.8 oz (111.948 kg)  07/03/12 250 lb 12.8 oz (113.762 kg)   Constitutional: She is overweight, but appears well-developed and well-nourished. No distress. Eyes: wears glasses. Conjunctivae and EOM are normal. Pupils are equal, round, and reactive to light. No scleral icterus.  Neck: Thick. Normal range of motion. Neck supple. No JVD present. No thyromegaly present.  Cardiovascular: Normal rate, regular rhythm and normal heart sounds.  No murmur heard. No BLE edema, but B fatty ankles. Pulmonary/Chest: Effort normal and breath sounds normal. No respiratory distress. She has no wheezes. Psychiatric: She has a dysphoric mood and affect. Her behavior is normal. Judgment and thought content normal.   Lab Results  Component Value Date   WBC 9.5 01/01/2012   HGB 12.9 01/01/2012   HCT 38.3 01/01/2012   PLT 290.0 01/01/2012   GLUCOSE 140* 01/01/2012  CHOL 163 01/01/2012   TRIG 147.0 01/01/2012   HDL 53.40 01/01/2012   LDLCALC 80 01/01/2012   ALT 17 01/01/2012   AST 18 01/01/2012   NA 138 01/01/2012   K 4.4 01/01/2012   CL 101 01/01/2012   CREATININE 1.2 01/01/2012   BUN 24* 01/01/2012   CO2 28 01/01/2012   TSH 4.83 01/01/2012   HGBA1C 6.6* 04/08/2012       Assessment & Plan:   See problem list. Medications and labs reviewed today.  Time spent with pt and PA student today 42 minutes, greater than 50% time spent counseling patient on diabetes, depression and medication review. Also review of  prior records

## 2012-10-10 ENCOUNTER — Encounter: Payer: Self-pay | Admitting: *Deleted

## 2012-11-04 ENCOUNTER — Telehealth: Payer: Self-pay

## 2012-11-04 MED ORDER — NOVA MAX CONTROL NORMAL VI LIQD: 1.0000 | Status: DC | PRN

## 2012-11-04 MED ORDER — GLUCOSE BLOOD VI STRP: ORAL_STRIP | Status: DC

## 2012-11-04 NOTE — Telephone Encounter (Signed)
Pt called requesting diabetic supplies to local pharmacy.

## 2012-11-20 ENCOUNTER — Encounter: Payer: Self-pay | Admitting: Internal Medicine

## 2012-11-20 ENCOUNTER — Ambulatory Visit (INDEPENDENT_AMBULATORY_CARE_PROVIDER_SITE_OTHER): Payer: 59 | Admitting: Internal Medicine

## 2012-11-20 VITALS — BP 142/80 | HR 65 | Temp 97.1°F | Wt 241.0 lb

## 2012-11-20 DIAGNOSIS — R42 Dizziness and giddiness: Secondary | ICD-10-CM

## 2012-11-20 DIAGNOSIS — Z23 Encounter for immunization: Secondary | ICD-10-CM

## 2012-11-20 DIAGNOSIS — F329 Major depressive disorder, single episode, unspecified: Secondary | ICD-10-CM

## 2012-11-20 DIAGNOSIS — E119 Type 2 diabetes mellitus without complications: Secondary | ICD-10-CM

## 2012-11-20 DIAGNOSIS — J302 Other seasonal allergic rhinitis: Secondary | ICD-10-CM

## 2012-11-20 DIAGNOSIS — J309 Allergic rhinitis, unspecified: Secondary | ICD-10-CM

## 2012-11-20 DIAGNOSIS — Z2911 Encounter for prophylactic immunotherapy for respiratory syncytial virus (RSV): Secondary | ICD-10-CM

## 2012-11-20 DIAGNOSIS — I10 Essential (primary) hypertension: Secondary | ICD-10-CM

## 2012-11-20 DIAGNOSIS — G4733 Obstructive sleep apnea (adult) (pediatric): Secondary | ICD-10-CM

## 2012-11-20 MED ORDER — LOSARTAN POTASSIUM 100 MG PO TABS: 100.0000 mg | ORAL_TABLET | Freq: Every day | ORAL | Status: DC

## 2012-11-20 MED ORDER — FLUTICASONE PROPIONATE 50 MCG/ACT NA SUSP: 2.0000 | Freq: Every day | NASAL | Status: DC

## 2012-11-20 NOTE — Assessment & Plan Note (Signed)
Persisting concerns about ill fitting mask - Advised follow up with pulm/sleep ([facility]) re: same

## 2012-11-20 NOTE — Progress Notes (Signed)
  Subjective:    Patient ID: Jamie Aguirre, female    DOB: 1946-07-20, 67 y.o.   MRN: [id number]  HPI  Here for follow up - did not begin paxil as rx'd  Past Medical History  Diagnosis Date  . Asthma   . Arthritis   . Type 2 diabetes mellitus   . Diverticulitis   . Chronic bronchitis   . Headaches, cluster   . Allergic rhinitis, seasonal     assocaited with vertigo sensation  . Chronic kidney disease   . Hypertension   . Hyperlipidemia   . 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 Pellucid marginal degeneration of cornea   . Hives     idiopathic    Review of Systems    Respiratory: Negative for cough and shortness of breath.   Cardiovascular: Negative for chest pain or palpitations.       Objective:   Physical Exam  BP 142/80  Pulse 65  Temp(Src) 97.1 F (36.2 C) (Oral)  Wt 241 lb (109.317 kg)  BMI 41.35 kg/m2  SpO2 94% Wt Readings from Last 3 Encounters:  11/20/12 241 lb (109.317 kg)  10/09/12 242 lb 6.4 oz (109.952 kg)  07/19/12 246 lb 12.8 oz (111.948 kg)   Constitutional: She is overweight, but appears well-developed and well-nourished. No distress. Eyes: wears glasses. Conjunctivae and EOM are normal. Pupils are equal, round, and reactive to light. No scleral icterus.  Neck: Thick. Normal range of motion. Neck supple. No JVD present. No thyromegaly present.  Cardiovascular: Normal rate, regular rhythm and normal heart sounds.  No murmur heard. No BLE edema, but B fatty ankles. Pulmonary/Chest: Effort normal and breath sounds normal. No respiratory distress. She has no wheezes. Psychiatric: She has a dysphoric mood and affect. Her behavior is normal. Judgment and thought content normal.   Lab Results  Component Value Date   WBC 9.5 01/01/2012   HGB 12.9 01/01/2012   HCT 38.3 01/01/2012   PLT 290.0 01/01/2012   GLUCOSE 140* 01/01/2012   CHOL 163 01/01/2012   TRIG 147.0 01/01/2012   HDL 53.40 01/01/2012   LDLCALC 80 01/01/2012   ALT 17 01/01/2012   AST 18  01/01/2012   NA 138 01/01/2012   K 4.4 01/01/2012   CL 101 01/01/2012   CREATININE 1.2 01/01/2012   BUN 24* 01/01/2012   CO2 28 01/01/2012   TSH 4.83 01/01/2012   HGBA1C 7.3* 10/09/2012       Assessment & Plan:   See problem list. Medications and labs reviewed today.

## 2012-11-20 NOTE — Assessment & Plan Note (Signed)
Low-grade symptoms of dysthymia Described increasing depression symptoms and episode of panic attack 09/2012 Declined to begin generic paxil as prescribed due to feared side effects (potential to exac dizziness)  Previously counseling, declines return to same Reports stable at this time - declines medications Will follow up as needed

## 2012-11-20 NOTE — Assessment & Plan Note (Signed)
Increase symptoms with spring season Advised temporary change claritin to Zyrtec for the season Also start nasal steroid - erx done

## 2012-11-20 NOTE — Assessment & Plan Note (Signed)
BP Readings from Last 3 Encounters:  11/20/12 142/80  10/09/12 152/90  07/19/12 132/78   The current medical regimen is generally effective;  continue present plan and medications.

## 2012-11-20 NOTE — Assessment & Plan Note (Signed)
Reviewed again importance of diet and med compliance  Follows with optho regularly On ASA, ARB and statin The current medical regimen is effective;  continue present plan and medications. Lab Results  Component Value Date   HGBA1C 7.3* 10/09/2012

## 2012-11-20 NOTE — Assessment & Plan Note (Signed)
Chronic - Hx Meniere's -  Progressive hearing loss on R reviewed -  Working with ENT Jac Canavan) on same Previously intolerant of meclizine because of side effects try low-dose Valium as needed -new rx done 09/2012 also refer to vestibular rehab

## 2012-11-20 NOTE — Patient Instructions (Addendum)
It was good to see you today. We have reviewed your interval history with other providers and labs and tests today Medications reviewed and updated, add Flonase for allergy symptoms (nose spray) and change claritin to Zyrtec for spring allergy season Your prescription(s) and refills have been submitted to your pharmacy. Please take as directed and contact our office if you believe you are having problem(s) with the medication(s). Follow up with [facility] and/or Dr Charlett Blake for your right foot and ankle as needed we'll make referral to vestibular rehab as requested . Our office will contact you regarding appointment(s) once made. Continue to work on lifestyle changes as discussed (low fat, low carb, increased protein diet; improved exercise efforts; weight loss) to control sugar, blood pressure and cholesterol levels and/or reduce risk of developing other medical problems. Look into [email or web address] or other type of food journal to assist you in this process. Please schedule followup in 3 months for diabetes mellitus labs and review, call sooner if problems. tetanus and Shingles vaccine given today

## 2012-12-18 ENCOUNTER — Telehealth: Payer: Self-pay

## 2012-12-18 NOTE — Telephone Encounter (Signed)
Phone call from patient stating she received a letter from the breast center regarding her being due for a mammogram. She says they send a letter yearly. She would like to know from you how often does she really need to have a mammogram done. Please advise. She states she will be home after 3:30. She does not have voicemail for msg to be left.

## 2012-12-18 NOTE — Telephone Encounter (Signed)
Pt notified. Had no other concerns or questions.

## 2012-12-18 NOTE — Telephone Encounter (Signed)
Every 1-2 years, pt may choose her preference and i will support her decision

## 2013-02-04 HISTORY — PX: CATARACT EXTRACTION W/ INTRAOCULAR LENS IMPLANT: SHX1309

## 2013-02-07 ENCOUNTER — Ambulatory Visit (INDEPENDENT_AMBULATORY_CARE_PROVIDER_SITE_OTHER): Payer: 59 | Admitting: Internal Medicine

## 2013-02-07 ENCOUNTER — Other Ambulatory Visit: Payer: 59

## 2013-02-07 ENCOUNTER — Encounter: Payer: Self-pay | Admitting: Internal Medicine

## 2013-02-07 VITALS — BP 130/80 | HR 75 | Temp 97.9°F | Wt 239.8 lb

## 2013-02-07 DIAGNOSIS — N390 Urinary tract infection, site not specified: Secondary | ICD-10-CM

## 2013-02-07 LAB — POCT URINALYSIS DIPSTICK
Bilirubin, UA: NEGATIVE
Blood, UA: NEGATIVE
Nitrite, UA: NEGATIVE
Urobilinogen, UA: 0.2
pH, UA: 5

## 2013-02-07 MED ORDER — NITROFURANTOIN MONOHYD MACRO 100 MG PO CAPS: 100.0000 mg | ORAL_CAPSULE | Freq: Two times a day (BID) | ORAL | Status: DC

## 2013-02-07 NOTE — Progress Notes (Signed)
HPI: complains of UTI symptoms Onset 4 days ago, progressively worse associated with dysuria and small volume voiding with increased frequency denies hematuria, flank pain or fever The patient has a history of prior UTI  PMH: reviewed  ROS:  Gen.: No unexpected weight change, no night sweats Lungs: No cough or shortness of breath Cardiovascular: No palpitations or chest pain  PE: BP 130/80  Pulse 75  Temp(Src) 97.9 F (36.6 C) (Oral)  Wt 239 lb 12.8 oz (108.773 kg)  BMI 41.14 kg/m2  SpO2 98% General: No acute distress Lungs: Clear to auscultation Cardiovascular: Regular rate rhythm, no edema Abdomen: Mild to moderate discomfort of her suprapubic region, no flank tenderness to palpation  Lab Results  Component Value Date   WBC 9.5 01/01/2012   HGB 12.9 01/01/2012   HCT 38.3 01/01/2012   PLT 290.0 01/01/2012   GLUCOSE 140* 01/01/2012   CHOL 163 01/01/2012   TRIG 147.0 01/01/2012   HDL 53.40 01/01/2012   LDLCALC 80 01/01/2012   ALT 17 01/01/2012   AST 18 01/01/2012   NA 138 01/01/2012   K 4.4 01/01/2012   CL 101 01/01/2012   CREATININE 1.2 01/01/2012   BUN 24* 01/01/2012   CO2 28 01/01/2012   TSH 4.83 01/01/2012   HGBA1C 7.3* 10/09/2012    Assessment/Plan: UTI, classic symptoms with history of same  Empiric antibiotic x7 days Urine culture for identification and sensitivities Hydration recommended education provided

## 2013-02-07 NOTE — Patient Instructions (Signed)
It was good to see you today. Macrobid antibiotics 2x/day x 5 days - Your prescription(s) have been submitted to your pharmacy. Please take as directed and contact our office if you believe you are having problem(s) with the medication(s). Test(s) ordered today. Your results will be released to MyChart (or called to you) after review, usually within 72hours after test completion. If any changes need to be made, you will be notified at that same time. Alternate between ibuprofen and tylenol for aches, pain and fever symptoms as discussed Hydrate, rest and call if worse or unimproved

## 2013-02-20 ENCOUNTER — Encounter: Payer: Self-pay | Admitting: Internal Medicine

## 2013-02-20 ENCOUNTER — Ambulatory Visit (INDEPENDENT_AMBULATORY_CARE_PROVIDER_SITE_OTHER): Payer: 59 | Admitting: Internal Medicine

## 2013-02-20 ENCOUNTER — Other Ambulatory Visit (INDEPENDENT_AMBULATORY_CARE_PROVIDER_SITE_OTHER): Payer: 59

## 2013-02-20 VITALS — BP 158/72 | HR 70 | Temp 97.8°F | Wt 235.1 lb

## 2013-02-20 DIAGNOSIS — E119 Type 2 diabetes mellitus without complications: Secondary | ICD-10-CM

## 2013-02-20 DIAGNOSIS — E785 Hyperlipidemia, unspecified: Secondary | ICD-10-CM

## 2013-02-20 DIAGNOSIS — G4700 Insomnia, unspecified: Secondary | ICD-10-CM

## 2013-02-20 LAB — LIPID PANEL
Cholesterol: 183 mg/dL (ref 0–200)
HDL: 47.6 mg/dL (ref >39.00)
Total CHOL/HDL Ratio: 4
Triglycerides: 223 mg/dL — ABNORMAL HIGH (ref 0.0–149.0)
VLDL: 44.6 mg/dL — ABNORMAL HIGH (ref 0.0–40.0)

## 2013-02-20 LAB — LDL CHOLESTEROL, DIRECT: Direct LDL: 113.4 mg/dL

## 2013-02-20 LAB — MICROALBUMIN / CREATININE URINE RATIO: Microalb, Ur: 7 mg/dL — ABNORMAL HIGH (ref 0.0–1.9)

## 2013-02-20 MED ORDER — LOVASTATIN 20 MG PO TABS: 20.0000 mg | ORAL_TABLET | Freq: Every day | ORAL | Status: DC

## 2013-02-20 MED ORDER — AMITRIPTYLINE HCL 10 MG PO TABS: 10.0000 mg | ORAL_TABLET | Freq: Every day | ORAL | Status: DC

## 2013-02-20 NOTE — Assessment & Plan Note (Signed)
Reviewed again importance of diet and med compliance  Follows with optho regularly On ASA, ARB and statin Check a1c, microalb and statin as due The current medical regimen is effective;  continue present plan and medications. Lab Results  Component Value Date   HGBA1C 7.3* 10/09/2012

## 2013-02-20 NOTE — Assessment & Plan Note (Signed)
On statin - unable to tolerate higher dose due to GI side effects  Check lipids with CPX and annually

## 2013-02-20 NOTE — Patient Instructions (Addendum)
It was good to see you today. We have reviewed your interval history with other providers and labs and tests today Test(s) ordered today. Your results will be released to MyChart (or called to you) after review, usually within 72hours after test completion. If any changes need to be made, you will be notified at that same time. Medications reviewed and updated, try amitriptyline for sleep and toe pain Your prescription(s) and refills have been submitted to your pharmacy. Please take as directed and contact our office if you believe you are having problem(s) with the medication(s). Follow up with Dr Charlett Blake for your right foot and ankle as needed Continue to work on lifestyle changes as discussed (low fat, low carb, increased protein diet; improved exercise efforts; weight loss) to control sugar, blood pressure and cholesterol levels and/or reduce risk of developing other medical problems. Look into [email or web address] or other type of food journal to assist you in this process. Please schedule followup in 3 months for diabetes and medication recheck, call sooner if problems.

## 2013-02-20 NOTE — Progress Notes (Signed)
  Subjective:    Patient ID: Jamie Aguirre, female    DOB: 1946/06/06, 67 y.o.   MRN: [id number]  HPI  Here for follow up - reviewed chronic medical conditions and interval hx  Past Medical History  Diagnosis Date  . Asthma   . Arthritis   . Type 2 diabetes mellitus   . Diverticulitis   . Chronic bronchitis   . Headaches, cluster   . Allergic rhinitis, seasonal     assocaited with vertigo sensation  . Chronic kidney disease   . Hypertension   . Hyperlipidemia   . 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 Pellucid marginal degeneration of cornea   . Hives     idiopathic    Review of Systems  Respiratory: Negative for cough and shortness of breath.   Cardiovascular: Negative for chest pain or palpitations.       Objective:   Physical Exam  BP 158/72  Pulse 70  Temp(Src) 97.8 F (36.6 C) (Oral)  Wt 235 lb 1.9 oz (106.65 kg)  BMI 40.34 kg/m2  SpO2 96% Wt Readings from Last 3 Encounters:  02/20/13 235 lb 1.9 oz (106.65 kg)  02/07/13 239 lb 12.8 oz (108.773 kg)  11/20/12 241 lb (109.317 kg)   Constitutional: She is overweight, but appears well-developed and well-nourished. No distress. Eyes: wears glasses. Conjunctivae and EOM are normal. Pupils are equal, round, and reactive to light. No scleral icterus.  Neck: Thick. Normal range of motion. Neck supple. No JVD present. No thyromegaly present.  Cardiovascular: Normal rate, regular rhythm and normal heart sounds.  No murmur heard. No BLE edema, but B fatty ankles. Pulmonary/Chest: Effort normal and breath sounds normal. No respiratory distress. She has no wheezes. Psychiatric: She has a dysphoric mood and affect. Her behavior is normal. Judgment and thought content normal.   Lab Results  Component Value Date   WBC 9.5 01/01/2012   HGB 12.9 01/01/2012   HCT 38.3 01/01/2012   PLT 290.0 01/01/2012   GLUCOSE 140* 01/01/2012   CHOL 163 01/01/2012   TRIG 147.0 01/01/2012   HDL 53.40 01/01/2012   LDLCALC 80 01/01/2012   ALT 17 01/01/2012   AST 18 01/01/2012   NA 138 01/01/2012   K 4.4 01/01/2012   CL 101 01/01/2012   CREATININE 1.2 01/01/2012   BUN 24* 01/01/2012   CO2 28 01/01/2012   TSH 4.83 01/01/2012   HGBA1C 7.3* 10/09/2012       Assessment & Plan:   See problem list. Medications and labs reviewed today.

## 2013-02-20 NOTE — Addendum Note (Signed)
Addended by: Rene Paci A on: 02/20/2013 06:34 PM   Modules accepted: Orders

## 2013-02-21 ENCOUNTER — Telehealth: Payer: Self-pay | Admitting: *Deleted

## 2013-02-21 MED ORDER — ATORVASTATIN CALCIUM 20 MG PO TABS: 20.0000 mg | ORAL_TABLET | Freq: Every day | ORAL | Status: DC

## 2013-02-21 NOTE — Telephone Encounter (Signed)
Notified pt. She states she will check this with her pharmacist first before proceeding with medication.

## 2013-02-21 NOTE — Telephone Encounter (Signed)
Attempted to reach pt and received message that wireless customer is not available at this time.

## 2013-02-21 NOTE — Telephone Encounter (Signed)
Ok, noted Stop mevacor Start atorvastatom 20mg  - daily if able - if causes diarrhea or other problem, ok to reduce to qod atorva is stronger than mevacor at reducing LDL - New erx done thanks

## 2013-02-21 NOTE — Telephone Encounter (Signed)
Notified pt of instructions below. Pt states she will not be able to take mevacor as it caused diarrhea when she took it on a daily basis in the past.  Please advise.

## 2013-02-21 NOTE — Telephone Encounter (Signed)
Message copied by Kathi Simpers on Fri Feb 21, 2013 11:43 AM ------      Message from: Rene Paci A      Created: Thu Feb 20, 2013  6:35 PM       Please call patient:      I have reviewed all lab results - diabetes mellitus is controlled       Cholesterol is up: LDL at 113, goal <100 -       Increase mevacor (cholesterol pill) to one pill every day, (currently qod) -      No other treatment changes recommended. Let me know if there are questions or problems. Thanks ------

## 2013-05-03 ENCOUNTER — Other Ambulatory Visit: Payer: Self-pay | Admitting: Internal Medicine

## 2013-05-26 ENCOUNTER — Telehealth: Payer: Self-pay | Admitting: Internal Medicine

## 2013-05-26 NOTE — Telephone Encounter (Signed)
Patient called with elevated BS, 272.  Attempted to call patient back times 2 but no answer (received [employer]).

## 2013-05-27 ENCOUNTER — Telehealth: Payer: Self-pay | Admitting: *Deleted

## 2013-05-27 NOTE — Telephone Encounter (Signed)
[facility] Triage Call Report Triage Record Num: [id number] Operator: Kelle Darting Patient Name: Jamie Aguirre Call Date & Time: 05/26/2013 5:04:29PM Patient Phone: [phone] PCP: Rene Paci Patient Gender: Female PCP Fax : [phone] Patient DOB: [birth year] [facility] Name[facility] Reason for Call: Caller: Velmer/Patient; PCP: Rene Paci (Adults only); CB#: [phone]; Call regarding Blood Sugar Elevated; Afebrile; Onset: 05/26/13; Sx notes: Has a problem remembering to take her medications, is not eating the way she should; has not taken her blood sugar meds several times this week, also a problem sleeping; had a bad night last night, only slept 1.5 hours, had bad pain in her feet; Took all of her morning meds at 1400 today when she remembered, wants to know if she should take her night time diabetes pill; Pt. just ate her evening meal, blood sugar at this time is at 208; Guideline used: Diabetes: Control Problems; Disposition: See provider within 4 hours due to glucose not within provider defined guidelines AND not taking medications as Rx; Spoke with Dr. Lovell Sheehan, states pt. can go ahead and take her Jentadueto tonight, will not drop her blood sugar precipitously, can take normal time in morning, there is a once per day version she can discuss with her PCP; Pt. informed of same, voiced understanding of all; Appt. made: No, pt. to f/u with office. Protocol(s) Used: Diabetes: Control Problems Recommended Outcome per Protocol: See Provider within 4 hours Reason for Outcome: New or increasing symptoms OR glucose not within provider defined guidelines AND not taking medications/following treatment plan Care Advice: Follow the usual meal plan if possible. Drink extra non-caloric fluids. If unable to eat at all, drink regular soft drinks and juices so that 50 grams of carbohydrates are taken in every 3 to 4 hours. ~ Go to ED immediately if blood sugar is 300  mg/dl or more AND symptoms are present including: large urine ketones, rapid, deep breathing, fruity breath, nausea, vomiting or abdominal pain; confused thinking, dry, flushed skin; extreme thirst, extreme fatigue or weakness; or frequent urination. ~ 05/26/2013 5:56:15PM Page 1 of 1 [email or web address]

## 2013-05-28 ENCOUNTER — Ambulatory Visit (INDEPENDENT_AMBULATORY_CARE_PROVIDER_SITE_OTHER): Payer: 59 | Admitting: Internal Medicine

## 2013-05-28 ENCOUNTER — Encounter: Payer: Self-pay | Admitting: Internal Medicine

## 2013-05-28 ENCOUNTER — Other Ambulatory Visit (INDEPENDENT_AMBULATORY_CARE_PROVIDER_SITE_OTHER): Payer: 59

## 2013-05-28 VITALS — BP 128/70 | HR 71 | Temp 98.2°F | Wt 236.1 lb

## 2013-05-28 DIAGNOSIS — M25579 Pain in unspecified ankle and joints of unspecified foot: Secondary | ICD-10-CM

## 2013-05-28 DIAGNOSIS — Z0000 Encounter for general adult medical examination without abnormal findings: Secondary | ICD-10-CM

## 2013-05-28 DIAGNOSIS — E119 Type 2 diabetes mellitus without complications: Secondary | ICD-10-CM

## 2013-05-28 DIAGNOSIS — Z23 Encounter for immunization: Secondary | ICD-10-CM

## 2013-05-28 DIAGNOSIS — G8929 Other chronic pain: Secondary | ICD-10-CM

## 2013-05-28 LAB — HEPATIC FUNCTION PANEL
ALT: 22 U/L (ref 0–35)
AST: 15 U/L (ref 0–37)
Albumin: 4.2 g/dL (ref 3.5–5.2)
Alkaline Phosphatase: 49 U/L (ref 39–117)
Bilirubin, Direct: 0.1 mg/dL (ref 0.0–0.3)
Total Bilirubin: 0.7 mg/dL (ref 0.3–1.2)

## 2013-05-28 LAB — BASIC METABOLIC PANEL
BUN: 25 mg/dL — ABNORMAL HIGH (ref 6–23)
CO2: 26 mEq/L (ref 19–32)
Calcium: 9 mg/dL (ref 8.4–10.5)
GFR: 43.95 mL/min — ABNORMAL LOW (ref >60.00)
Glucose, Bld: 149 mg/dL — ABNORMAL HIGH (ref 70–99)
Sodium: 137 mEq/L (ref 135–145)

## 2013-05-28 LAB — CBC WITH DIFFERENTIAL/PLATELET
Basophils Absolute: 0.1 10*3/uL (ref 0.0–0.1)
Eosinophils Absolute: 0.5 10*3/uL (ref 0.0–0.7)
Hemoglobin: 12.9 g/dL (ref 12.0–15.0)
Lymphocytes Relative: 19.3 % (ref 12.0–46.0)
Lymphs Abs: 1.6 10*3/uL (ref 0.7–4.0)
MCHC: 34.1 g/dL (ref 30.0–36.0)
Monocytes Relative: 7.1 % (ref 3.0–12.0)
Neutro Abs: 5.6 10*3/uL (ref 1.4–7.7)
Neutrophils Relative %: 67.2 % (ref 43.0–77.0)
Platelets: 296 10*3/uL (ref 150.0–400.0)
RBC: 4.54 Mil/uL (ref 3.87–5.11)
RDW: 15.2 % — ABNORMAL HIGH (ref 11.5–14.6)

## 2013-05-28 LAB — TSH: TSH: 3.79 u[IU]/mL (ref 0.35–5.50)

## 2013-05-28 MED ORDER — DICLOFENAC SODIUM 75 MG PO TBEC: 75.0000 mg | DELAYED_RELEASE_TABLET | Freq: Two times a day (BID) | ORAL | Status: DC | PRN

## 2013-05-28 NOTE — Assessment & Plan Note (Signed)
Reviewed again importance of diet and med compliance as rx'd Follows with optho regularly On ASA, ARB and statin Check a1c, microalb and statin as due The current medical regimen is effective;  continue present plan and medications. Lab Results  Component Value Date   HGBA1C 7.0* 02/20/2013

## 2013-05-28 NOTE — Assessment & Plan Note (Signed)
Sees ortho [facility] as needed ror same Ok for prn Voltaren qhs - erx done

## 2013-05-28 NOTE — Patient Instructions (Addendum)
It was good to see you today.  Your annual flu shot was given and/or updated today.  We have reviewed your prior records including labs and tests today  Health Maintenance reviewed - all recommended immunizations and age-appropriate screenings are up-to-date or declined.  Test(s) ordered today. Your results will be released to MyChart (or called to you) after review, usually within 72hours after test completion. If any changes need to be made, you will be notified at that same time.  Medications reviewed and updated, ok to take Voltaren at bedtime as needed for pain -no other changes recommended at this time.  Your prescription(s) have been submitted to your pharmacy. Please take as directed and contact our office if you believe you are having problem(s) with the medication(s).  Please schedule followup in 4-6 months for diabetes mellitus check, call sooner if problems.  Health Maintenance, Females A healthy lifestyle and preventative care can promote health and wellness.  Maintain regular health, dental, and eye exams.  Eat a healthy diet. Foods like vegetables, fruits, whole grains, low-fat dairy products, and lean protein foods contain the nutrients you need without too many calories. Decrease your intake of foods high in solid fats, added sugars, and salt. Get information about a proper diet from your caregiver, if necessary.  Regular physical exercise is one of the most important things you can do for your health. Most adults should get at least 150 minutes of moderate-intensity exercise (any activity that increases your heart rate and causes you to sweat) each week. In addition, most adults need muscle-strengthening exercises on 2 or more days a week.   Maintain a healthy weight. The body mass index (BMI) is a screening tool to identify possible weight problems. It provides an estimate of body fat based on height and weight. Your caregiver can help determine your BMI, and can help you  achieve or maintain a healthy weight. For adults 20 years and older:  A BMI below 18.5 is considered underweight.  A BMI of 18.5 to 24.9 is normal.  A BMI of 25 to 29.9 is considered overweight.  A BMI of 30 and above is considered obese.  Maintain normal blood lipids and cholesterol by exercising and minimizing your intake of saturated fat. Eat a balanced diet with plenty of fruits and vegetables. Blood tests for lipids and cholesterol should begin at age 43 and be repeated every 5 years. If your lipid or cholesterol levels are high, you are over 50, or you are a high risk for heart disease, you may need your cholesterol levels checked more frequently.Ongoing high lipid and cholesterol levels should be treated with medicines if diet and exercise are not effective.  If you smoke, find out from your caregiver how to quit. If you do not use tobacco, do not start.  If you are pregnant, do not drink alcohol. If you are breastfeeding, be very cautious about drinking alcohol. If you are not pregnant and choose to drink alcohol, do not exceed 1 drink per day. One drink is considered to be 12 ounces (355 mL) of beer, 5 ounces (148 mL) of wine, or 1.5 ounces (44 mL) of liquor.  Avoid use of street drugs. Do not share needles with anyone. Ask for help if you need support or instructions about stopping the use of drugs.  High blood pressure causes heart disease and increases the risk of stroke. Blood pressure should be checked at least every 1 to 2 years. Ongoing high blood pressure should be treated  with medicines, if weight loss and exercise are not effective.  If you are 80 to 66 years old, ask your caregiver if you should take aspirin to prevent strokes.  Diabetes screening involves taking a blood sample to check your fasting blood sugar level. This should be done once every 3 years, after age 39, if you are within normal weight and without risk factors for diabetes. Testing should be considered at a  younger age or be carried out more frequently if you are overweight and have at least 1 risk factor for diabetes.  Breast cancer screening is essential preventative care for women. You should practice "breast self-awareness." This means understanding the normal appearance and feel of your breasts and may include breast self-examination. Any changes detected, no matter how small, should be reported to a caregiver. Women in their 16s and 30s should have a clinical breast exam (CBE) by a caregiver as part of a regular health exam every 1 to 3 years. After age 29, women should have a CBE every year. Starting at age 74, women should consider having a mammogram (breast X-ray) every year. Women who have a family history of breast cancer should talk to their caregiver about genetic screening. Women at a high risk of breast cancer should talk to their caregiver about having an MRI and a mammogram every year.  The Pap test is a screening test for cervical cancer. Women should have a Pap test starting at age 47. Between ages 24 and 40, Pap tests should be repeated every 2 years. Beginning at age 50, you should have a Pap test every 3 years as long as the past 3 Pap tests have been normal. If you had a hysterectomy for a problem that was not cancer or a condition that could lead to cancer, then you no longer need Pap tests. If you are between ages 37 and 39, and you have had normal Pap tests going back 10 years, you no longer need Pap tests. If you have had past treatment for cervical cancer or a condition that could lead to cancer, you need Pap tests and screening for cancer for at least 20 years after your treatment. If Pap tests have been discontinued, risk factors (such as a new sexual partner) need to be reassessed to determine if screening should be resumed. Some women have medical problems that increase the chance of getting cervical cancer. In these cases, your caregiver may recommend more frequent screening and Pap  tests.  The human papillomavirus (HPV) test is an additional test that may be used for cervical cancer screening. The HPV test looks for the virus that can cause the cell changes on the cervix. The cells collected during the Pap test can be tested for HPV. The HPV test could be used to screen women aged 80 years and older, and should be used in women of any age who have unclear Pap test results. After the age of 6, women should have HPV testing at the same frequency as a Pap test.  Colorectal cancer can be detected and often prevented. Most routine colorectal cancer screening begins at the age of 63 and continues through age 31. However, your caregiver may recommend screening at an earlier age if you have risk factors for colon cancer. On a yearly basis, your caregiver may provide home test kits to check for hidden blood in the stool. Use of a small camera at the end of a tube, to directly examine the colon (sigmoidoscopy or colonoscopy),  can detect the earliest forms of colorectal cancer. Talk to your caregiver about this at age 30, when routine screening begins. Direct examination of the colon should be repeated every 5 to 10 years through age 39, unless early forms of pre-cancerous polyps or small growths are found.  Hepatitis C blood testing is recommended for all people born from [birth year] through 1964 and any individual with known risks for hepatitis C.  Practice safe sex. Use condoms and avoid high-risk sexual practices to reduce the spread of sexually transmitted infections (STIs). Sexually active women aged 70 and younger should be checked for Chlamydia, which is a common sexually transmitted infection. Older women with new or multiple partners should also be tested for Chlamydia. Testing for other STIs is recommended if you are sexually active and at increased risk.  Osteoporosis is a disease in which the bones lose minerals and strength with aging. This can result in serious bone fractures. The risk  of osteoporosis can be identified using a bone density scan. Women ages 54 and over and women at risk for fractures or osteoporosis should discuss screening with their caregivers. Ask your caregiver whether you should be taking a calcium supplement or vitamin D to reduce the rate of osteoporosis.  Menopause can be associated with physical symptoms and risks. Hormone replacement therapy is available to decrease symptoms and risks. You should talk to your caregiver about whether hormone replacement therapy is right for you.  Use sunscreen with a sun protection factor (SPF) of 30 or greater. Apply sunscreen liberally and repeatedly throughout the day. You should seek shade when your shadow is shorter than you. Protect yourself by wearing long sleeves, pants, a wide-brimmed hat, and sunglasses year round, whenever you are outdoors.  Notify your caregiver of new moles or changes in moles, especially if there is a change in shape or color. Also notify your caregiver if a mole is larger than the size of a pencil eraser.  Stay current with your immunizations. Document Released: 02/13/2011 Document Revised: 10/23/2011 Document Reviewed: 02/13/2011 [facility] Patient Information 2014 [place], [place].

## 2013-05-28 NOTE — Progress Notes (Signed)
Subjective:    Patient ID: Jamie Aguirre, female    DOB: [birth year], 67 y.o.   MRN: [id number]  HPI patient is here today for annual physical and wellness review  Also reviewed recent hyperglycemia -feels related to medication noncompliance and poor dietary discretion also reviewed chronic medical conditions and interval hx  Past Medical History  Diagnosis Date  . Asthma   . Arthritis   . Type 2 diabetes mellitus   . Diverticulitis   . Chronic bronchitis   . Headaches, cluster   . Allergic rhinitis, seasonal     assocaited with vertigo sensation  . Chronic kidney disease   . Hypertension   . Hyperlipidemia   . 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 Pellucid marginal degeneration of cornea   . Hives     idiopathic   Family History  Problem Relation Age of Onset  . Arthritis Other   . Stroke Other   . Hypertension Other   . Hyperlipidemia Other   . Diabetes Other    History  Substance Use Topics  . Smoking status: Former Smoker -- 3.50 packs/day for 20 years    Types: Cigarettes    Quit date: 08/15/1975  . Smokeless tobacco: Never Used  . Alcohol Use: No    Review of Systems  Constitutional: Negative for fatigue and unexpected weight change.  Respiratory: Negative for cough, shortness of breath and wheezing.   Cardiovascular: Negative for chest pain, palpitations and leg swelling.  Gastrointestinal: Negative for nausea, abdominal pain and diarrhea.  Musculoskeletal: Positive for arthralgias (chronic R ankle). Negative for back pain, joint swelling and myalgias.  Neurological: Negative for dizziness, weakness, light-headedness and headaches.  Psychiatric/Behavioral: Negative for dysphoric mood. The patient is not nervous/anxious.   All other systems reviewed and are negative.       Objective:   Physical Exam BP 128/70  Pulse 71  Temp(Src) 98.2 F (36.8 C) (Oral)  Wt 236 lb 1.9 oz (107.103 kg)  BMI 40.51 kg/m2  SpO2 95% Wt Readings from Last 3  Encounters:  05/28/13 236 lb 1.9 oz (107.103 kg)  02/20/13 235 lb 1.9 oz (106.65 kg)  02/07/13 239 lb 12.8 oz (108.773 kg)   Constitutional: She is overweight, but appears well-developed and well-nourished. No distress. Eyes: wears glasses. Conjunctivae and EOM are normal. Pupils are equal, round, and reactive to light. No scleral icterus.  Neck: Thick. Normal range of motion. Neck supple. No JVD present. No thyromegaly present.  Cardiovascular: Normal rate, regular rhythm and normal heart sounds.  No murmur heard. No BLE edema, but B fatty ankles. Pulmonary/Chest: Effort normal and breath sounds normal. No respiratory distress. She has no wheezes. Musculoskeletal: Chronic enlargement of right ankle with bony changes, no effusion. Full range of motion with ligamentous function intact. Neurovascularly intact. No other gross deformities Psychiatric: She has a dysphoric mood and affect. Her behavior is normal. Judgment and thought content normal.   Lab Results  Component Value Date   WBC 9.5 01/01/2012   HGB 12.9 01/01/2012   HCT 38.3 01/01/2012   PLT 290.0 01/01/2012   GLUCOSE 140* 01/01/2012   CHOL 183 02/20/2013   TRIG 223.0* 02/20/2013   HDL 47.60 02/20/2013   LDLDIRECT 113.4 02/20/2013   LDLCALC 80 01/01/2012   ALT 17 01/01/2012   AST 18 01/01/2012   NA 138 01/01/2012   K 4.4 01/01/2012   CL 101 01/01/2012   CREATININE 1.2 01/01/2012   BUN 24* 01/01/2012   CO2 28  01/01/2012   TSH 4.83 01/01/2012   HGBA1C 7.0* 02/20/2013   MICROALBUR 7.0* 02/20/2013       Assessment & Plan:   CPX/AWV/v70.0 - Patient has been counseled on age-appropriate routine health concerns for screening and prevention. These are reviewed and up-to-date. Immunizations are up-to-date or declined. Labs ordered and reviewed.  Also see problem list. Medications and labs reviewed today.

## 2013-05-29 ENCOUNTER — Telehealth: Payer: Self-pay

## 2013-05-29 NOTE — Telephone Encounter (Signed)
I presume you (she) mean trouble controlling "bowels"...  Over-the-counter Imodium as needed, maximum 8 tablets in 24 hours

## 2013-05-29 NOTE — Telephone Encounter (Signed)
Notified patient of her recent lab results. She states she meant to mention in the OV that she is having trouble controlling her biles x3days. She would like to know what you suggest she do about it? Please advise.

## 2013-05-29 NOTE — Telephone Encounter (Signed)
I apologize I did mean bowels. Spoke with patient she states she has immodium and will try it. Pt states she will call back if this does not provide improvement...ds,cma

## 2013-06-23 ENCOUNTER — Ambulatory Visit: Payer: 59 | Admitting: Internal Medicine

## 2013-06-23 DIAGNOSIS — Z0289 Encounter for other administrative examinations: Secondary | ICD-10-CM

## 2013-07-02 ENCOUNTER — Encounter: Payer: Self-pay | Admitting: Pulmonary Disease

## 2013-07-02 ENCOUNTER — Ambulatory Visit (INDEPENDENT_AMBULATORY_CARE_PROVIDER_SITE_OTHER): Payer: Medicare Other | Admitting: Pulmonary Disease

## 2013-07-02 VITALS — BP 142/80 | HR 61 | Temp 97.6°F | Ht 64.0 in | Wt 243.0 lb

## 2013-07-02 DIAGNOSIS — G4733 Obstructive sleep apnea (adult) (pediatric): Secondary | ICD-10-CM

## 2013-07-02 NOTE — Patient Instructions (Signed)
Will change your home care company, and get a download from your machine. Will arrange for cushion replacements. Continue to work on weight loss followup with me in one year, but I will call you once I receive download to review with you.

## 2013-07-02 NOTE — Progress Notes (Signed)
  Subjective:    Patient ID: Jamie Aguirre, female    DOB: 1946-05-09, 67 y.o.   MRN: [id number]  HPI The patient comes in today for followup of her obstructive sleep apnea.  She is wearing CPAP compliantly, but is having frequent awakenings and nonrestorative sleep.  She admits that she has not changed the cushions on her mask, and it is unclear whether her sleep issues have anything to do with her sleep apnea.  Her weight is actually down 7 pounds from the last visit.   Review of Systems  Constitutional: Negative for fever and unexpected weight change.  HENT: Negative for congestion, dental problem, ear pain, nosebleeds, postnasal drip, rhinorrhea, sinus pressure, sneezing, sore throat and trouble swallowing.   Eyes: Negative for redness and itching.  Respiratory: Negative for cough, chest tightness, shortness of breath and wheezing.   Cardiovascular: Negative for palpitations and leg swelling.  Gastrointestinal: Negative for nausea and vomiting.  Genitourinary: Negative for dysuria.  Musculoskeletal: Negative for joint swelling.  Skin: Negative for rash.  Neurological: Positive for headaches.  Hematological: Does not bruise/bleed easily.  Psychiatric/Behavioral: Negative for dysphoric mood. The patient is nervous/anxious.        Objective:   Physical Exam Obese female in no acute distress Nose without purulent discharge noted No skin breakdown or pressure necrosis from the CPAP mask Neck without lymphadenopathy or thyromegaly Lower extremities with edema noted, no cyanosis Alert and oriented, moves all 4 extremities.       Assessment & Plan:

## 2013-07-02 NOTE — Assessment & Plan Note (Signed)
The patient is wearing CPAP compliantly, but continues to have restless sleep.  She admits that her mask is not ceiling as well as it should, and she has not kept up with cushion changes.  Unfortunately, her home care company took away her SD card, but we will see if we can get a download to check on pressures and mask leaks.  I suspect that her continued sleep disruption is associated with something else, but we need to verify that her sleep apnea is being adequately controlled.  I also encouraged her to continue working aggressively on weight loss.

## 2013-07-07 ENCOUNTER — Ambulatory Visit (INDEPENDENT_AMBULATORY_CARE_PROVIDER_SITE_OTHER)
Admission: RE | Admit: 2013-07-07 | Discharge: 2013-07-07 | Disposition: A | Discharge status: Home / Self Care | Payer: Medicare Other | Source: Ambulatory Visit | Attending: Internal Medicine | Admitting: Internal Medicine

## 2013-07-07 ENCOUNTER — Encounter: Payer: Self-pay | Admitting: Internal Medicine

## 2013-07-07 ENCOUNTER — Ambulatory Visit (INDEPENDENT_AMBULATORY_CARE_PROVIDER_SITE_OTHER): Payer: Medicare Other | Admitting: Internal Medicine

## 2013-07-07 VITALS — BP 136/86 | HR 65 | Temp 97.4°F | Ht 64.0 in | Wt 274.2 lb

## 2013-07-07 DIAGNOSIS — M25579 Pain in unspecified ankle and joints of unspecified foot: Secondary | ICD-10-CM

## 2013-07-07 DIAGNOSIS — F329 Major depressive disorder, single episode, unspecified: Secondary | ICD-10-CM

## 2013-07-07 DIAGNOSIS — G8929 Other chronic pain: Secondary | ICD-10-CM

## 2013-07-07 MED ORDER — DULOXETINE HCL 20 MG PO CPEP: 20.0000 mg | ORAL_CAPSULE | Freq: Every day | ORAL | Status: DC

## 2013-07-07 NOTE — Assessment & Plan Note (Signed)
Sees ortho [facility] as needed ror same Reports increased in daily symptoms for past 6 o, not relieved with NSAIDs as before ?new subtle injury -  Check xray and refer to sports med for 2nd opinon continue prn Voltaren qhs until then

## 2013-07-07 NOTE — Progress Notes (Signed)
  Subjective:    Patient ID: Jamie Aguirre, female    DOB: [birth year], 67 y.o.   MRN: [id number]  HPI  Here for follow up -  reviewed chronic medical conditions and interval hx  Past Medical History  Diagnosis Date  . Asthma   . Arthritis   . Type 2 diabetes mellitus   . Diverticulitis   . Chronic bronchitis   . Headaches, cluster   . Allergic rhinitis, seasonal     assocaited with vertigo sensation  . Chronic kidney disease   . Hypertension   . Hyperlipidemia   . 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 Pellucid marginal degeneration of cornea   . Hives     idiopathic    Review of Systems  Constitutional: Negative for fatigue and unexpected weight change.  Respiratory: Negative for cough, shortness of breath and wheezing.   Cardiovascular: Negative for chest pain, palpitations and leg swelling.  Gastrointestinal: Negative for nausea, abdominal pain and diarrhea.  Musculoskeletal: Positive for arthralgias (chronic R ankle). Negative for back pain, joint swelling and myalgias.  Neurological: Negative for dizziness, weakness, light-headedness and headaches.  Psychiatric/Behavioral: Negative for dysphoric mood. The patient is not nervous/anxious.   All other systems reviewed and are negative.       Objective:   Physical Exam BP 136/86  Pulse 65  Temp(Src) 97.4 F (36.3 C) (Oral)  Ht 5\' 4"  (1.626 m)  Wt 274 lb 4 oz (124.399 kg)  BMI 47.05 kg/m2 Wt Readings from Last 3 Encounters:  07/07/13 274 lb 4 oz (124.399 kg)  07/02/13 243 lb (110.224 kg)  05/28/13 236 lb 1.9 oz (107.103 kg)   Constitutional: She is overweight, but appears well-developed and well-nourished. No acute distress. Neck: Thick. Normal range of motion. Neck supple. No JVD present. No thyromegaly present.  Cardiovascular: Normal rate, regular rhythm and normal heart sounds.  No murmur heard. No BLE edema, but B fatty ankles. Pulmonary/Chest: Effort normal and breath sounds normal. No respiratory  distress. She has no wheezes. Musculoskeletal: Chronic enlargement of right ankle with bony changes, no effusion. Full range of motion with ligamentous function intact, but painful. Neurovascularly intact. No other gross deformities Psychiatric: She has a dysphoric mood and affect. Her behavior is normal. Judgment and thought content normal.   Lab Results  Component Value Date   WBC 8.3 05/28/2013   HGB 12.9 05/28/2013   HCT 38.0 05/28/2013   PLT 296.0 05/28/2013   GLUCOSE 149* 05/28/2013   CHOL 183 02/20/2013   TRIG 223.0* 02/20/2013   HDL 47.60 02/20/2013   LDLDIRECT 113.4 02/20/2013   LDLCALC 80 01/01/2012   ALT 22 05/28/2013   AST 15 05/28/2013   NA 137 05/28/2013   K 4.6 05/28/2013   CL 102 05/28/2013   CREATININE 1.3* 05/28/2013   BUN 25* 05/28/2013   CO2 26 05/28/2013   TSH 3.79 05/28/2013   HGBA1C 7.6* 05/28/2013   MICROALBUR 7.0* 02/20/2013       Assessment & Plan:   see problem list. Medications and labs reviewed today.

## 2013-07-07 NOTE — Patient Instructions (Signed)
It was good to see you today.  We have reviewed your prior records including labs and tests today  xray ordered today. Your results will be released to MyChart (or called to you) after review, usually within 72hours after test completion. If any changes need to be made, you will be notified at that same time.  Medications reviewed and updated, start Cymbalta for depression as well as for pain -no other changes recommended at this time.  Your prescription(s) have been submitted to your pharmacy. Please take as directed and contact our office if you believe you are having problem(s) with the medication(s).  Will schedule appointment with Dr Katrinka Blazing for your ankle to get 2nd opinion  Please schedule followup in 2-3 months for recheck pain, mood and diabetes mellitus check, call sooner if problems.

## 2013-07-07 NOTE — Progress Notes (Signed)
Pre visit review using our clinic review tool, if applicable. No additional management support is needed unless otherwise documented below in the visit note. 

## 2013-07-07 NOTE — Assessment & Plan Note (Signed)
Low-grade symptoms of dysthymia Described increasing depression symptoms and episode of panic attack 09/2012 Declined to begin generic paxil as prescribed 09/2012 due to feared side effects (potential to exac dizziness)  Previously counseling, declines return to same Reports increase symptoms at this time, willing to consider Cymbalta, esp with chronic pain we reviewed potential risk/benefit and possible side effects - pt understands and agrees to same

## 2013-07-08 ENCOUNTER — Ambulatory Visit (INDEPENDENT_AMBULATORY_CARE_PROVIDER_SITE_OTHER): Payer: Medicare Other | Admitting: Family Medicine

## 2013-07-08 ENCOUNTER — Encounter: Payer: Self-pay | Admitting: Family Medicine

## 2013-07-08 ENCOUNTER — Other Ambulatory Visit (INDEPENDENT_AMBULATORY_CARE_PROVIDER_SITE_OTHER): Payer: Medicare Other

## 2013-07-08 VITALS — BP 142/82 | HR 55 | Wt 273.0 lb

## 2013-07-08 DIAGNOSIS — M25571 Pain in right ankle and joints of right foot: Secondary | ICD-10-CM

## 2013-07-08 DIAGNOSIS — M7750 Other enthesopathy of unspecified foot: Secondary | ICD-10-CM

## 2013-07-08 DIAGNOSIS — M25579 Pain in unspecified ankle and joints of unspecified foot: Secondary | ICD-10-CM

## 2013-07-08 DIAGNOSIS — M19071 Primary osteoarthritis, right ankle and foot: Secondary | ICD-10-CM

## 2013-07-08 DIAGNOSIS — M7751 Other enthesopathy of right foot: Secondary | ICD-10-CM

## 2013-07-08 DIAGNOSIS — M19079 Primary osteoarthritis, unspecified ankle and foot: Secondary | ICD-10-CM

## 2013-07-08 NOTE — Patient Instructions (Signed)
Very nice to meet you.  Try exercises most days of the week.  Ice baths can be helpful fo 10 minutes a day.  Wear heel lifts in shoes to help your achilles tendon.   Take tylenol 650 mg three times a day is the best evidence based medicine we have for arthritis.  Glucosamine sulfate 750mg  twice a day is a supplement that has been shown to help moderate to severe arthritis. Vitamin D 1000 IU daily Fish oil 2 grams daily.  Tumeric 500mg  twice daily.  Capsaicin topically up to four times a day may also help with pain. Cortisone injections are an option if these interventions do not seem to make a difference or need more relief.  If cortisone injections do not help, there are different types of shots that may help but they take longer to take effect.  We can discuss this at follow up.  It's important that you continue to stay active. Controlling your weight is important.  Consider physical therapy to strengthen muscles around the joint that hurts to take pressure off of the joint itself. Shoe inserts with good arch support may be helpful.  [employer] [facility] at [employer] [facility] could help.  Water aerobics and cycling with low resistance are the best two types of exercise for arthritis. Come back and see me in 3-4 weeks.

## 2013-07-08 NOTE — Progress Notes (Signed)
I'm seeing this patient by the request  of:  Rene Paci, MD   CC: Right ankle pain  HPI: The patient is a  67 year old female coming in with right ankle pain, patient states that she has had this for quite some time it seems to be exacerbated over the course the last 3 weeks. Patient does remember having an initial pain while she was in water aerobics. Patient states the pain can be in the anterior as well as posterior aspect of the ankle. Patient is having more pain on the posterior aspect of the ankle. Patient continues to have swelling that seems to be worse at the end of the day. Patient denies any new symptoms such as numbness or radiation of the pain. Patient describes it is more of a dull aching sensation. He should also noticed she does not have the range of motion that she does with the other side. Patient has tried voltaren which has been helpful but pain never completely resolves. Patient is a severity 710.  X-rays are ordered. Patient's x-rays were reviewed by me in do show that patient has moderate to severe osteophytic changes of the ankle mortise mostly anterior. There is bone spurring occurring as well.  Past medical, surgical, family and social history reviewed. Medications reviewed all in the electronic medical record.   Review of Systems: No headache, visual changes, nausea, vomiting, diarrhea, constipation, dizziness, abdominal pain, skin rash, fevers, chills, night sweats, weight loss, swollen lymph nodes, body aches, joint swelling, muscle aches, chest pain, shortness of breath, mood changes.   Objective:    .Blood pressure 142/82, pulse 55, weight 273 lb (123.832 kg), SpO2 97.00%.   General: No apparent distress alert and oriented x3 mood and affect normal, dressed appropriately. Obese.  HEENT: Pupils equal, extraocular movements intact Respiratory: Patient's speak in full sentences and does not appear short of breath Cardiovascular: No lower extremity edema, non  tender, no erythema Skin: Warm dry intact with no signs of infection or rash on extremities or on axial skeleton. Abdomen: Soft nontender Neuro: Cranial nerves II through XII are intact, neurovascularly intact in all extremities with 2+ DTRs and 2+ pulses. Lymph: No lymphadenopathy of posterior or anterior cervical chain or axillae bilaterally.  Gait normal with good balance and coordination.  MSK: Non tender with full range of motion and good stability and symmetric strength and tone of shoulders, elbows, wrist, knee and bilaterally.  Ankle: Right No visible erythema trace edema Patient has a decreased range of motion especially with dorsiflexion lacking the last 10. Patient also lacks the last 5 of plantar flexion. Strength is 4/5 in all directions compared to 5 out of 5 on the contralateral side Stable lateral and medial ligaments; squeeze test and kleiger test unremarkable; Talar dome nontender; No pain at base of 5th MT; No tenderness over cuboid; No tenderness over N spot or navicular prominence No tenderness on posterior aspects of lateral and medial malleolus No sign of peroneal tendon subluxations or tenderness to palpation Negative tarsal tunnel tinel's Able to walk 4 steps. She does have some tenderness to palpation over the insertion of the Achilles.  MSK US performed of: Right ankle This study was ordered, performed, and interpreted by Terrilee Files D.O.  Foot/Ankle:   All structures visualized.   Talar dome unremarkable  Ankle mortise have significant osteophytic changes with bone spurring anteriorly mostly. Peroneus longus and brevis tendons unremarkable on long and transverse views without sheath effusions. Posterior tibialis, flexor hallucis longus, and flexor digitorum  longus tendons unremarkable on long and transverse views without sheath effusions. Achilles tendon visualized along length of tendon and unremarkable on long and transverse views without sheath effusion.  Patient though does have a retrocalcaneal bursitis that is calcified the Anterior Talofibular Ligament and Calcaneofibular Ligaments unremarkable and intact. Deltoid Ligament unremarkable and intact. Plantar fascia intact and without effusion, normal thickness. No increased doppler signal, cap sign, or thickening of tibial cortex. Power doppler signal normal.  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    Impression and Recommendations:     This case required medical decision making of moderate complexity.

## 2013-07-08 NOTE — Assessment & Plan Note (Signed)
Patient does have severe osteoarthritic changes of the right ankle. I do think that this is likely the underlying problem. Patient will start with some range of motion exercises. We did discuss weight control that could be beneficial. In addition of this discussed Hopper shoe choices as well as over-the-counter medications that can be helpful. Patient will followup again in 3-4 weeks. If she continues to have pain I would consider a injection.

## 2013-07-08 NOTE — Progress Notes (Signed)
Pre-visit discussion using our clinic review tool. No additional management support is needed unless otherwise documented below in the visit note.  

## 2013-07-08 NOTE — Assessment & Plan Note (Signed)
Patient also has what appears to be a calcific retrocalcaneal bursa. Discuss vitamin D supplementation Be beneficial as well as a home exercise program. Patient was given heel lifts they were added to her shoes today. Patient will try these interventions and come back again and see me in 3-4 weeks. She continues to have pain we can consider corticosteroid injection into the calcific bursa.

## 2013-07-16 ENCOUNTER — Ambulatory Visit: Payer: Medicare Other | Admitting: Internal Medicine

## 2013-07-29 ENCOUNTER — Ambulatory Visit (INDEPENDENT_AMBULATORY_CARE_PROVIDER_SITE_OTHER): Payer: Medicare Other | Admitting: Family Medicine

## 2013-07-29 ENCOUNTER — Encounter: Payer: Self-pay | Admitting: Family Medicine

## 2013-07-29 VITALS — BP 152/90 | HR 77

## 2013-07-29 DIAGNOSIS — M7660 Achilles tendinitis, unspecified leg: Secondary | ICD-10-CM

## 2013-07-29 DIAGNOSIS — M19079 Primary osteoarthritis, unspecified ankle and foot: Secondary | ICD-10-CM

## 2013-07-29 DIAGNOSIS — M19071 Primary osteoarthritis, right ankle and foot: Secondary | ICD-10-CM

## 2013-07-29 NOTE — Progress Notes (Signed)
Pre-visit discussion using our clinic review tool. No additional management support is needed unless otherwise documented below in the visit note.  

## 2013-07-29 NOTE — Assessment & Plan Note (Signed)
Spent greater than 25 minutes with patient face-to-face and had greater than 50% of counseling including as described above in assessment and plan. Patient will start formal physical therapy for arthritis of the right ankle as well as Achilles tendinitis secondary to the calcific retrocalcaneal bursa. Patient will start home exercises and we will did show her exercises today. We also discussed the diagnosis prognosis in significant detail. Patient did take notes and once again I did print significant amount of information. Discussed heel lifts and have this could be beneficial. Discussed proper shoe wear and possible inserts that could be helpful. Patient will try over-the-counter medications as well. Patient followup in 4 weeks after physical therapy. If she continues to have pain I would encourage injection in the retrocalcaneal bursa possible intra-articular ankle joint.

## 2013-07-29 NOTE — Patient Instructions (Addendum)
Very good to see you We will start physical therapy in a controlled setting for your ankle. We will go 2 times a week for four weeks.  Ice before bed.  Continue current medication.  Try the exercises as well.  Come back in 4 weeks.  Take tylenol 650 mg three times a day is the best evidence based medicine we have for arthritis.  Glucosamine sulfate 750mg  twice a day is a supplement that has been shown to help moderate to severe arthritis. Vitamin D 100 0IU daily Fish oil 2 grams daily.  Tumeric 500mg  twice daily.  Capsaicin topically up to four times a day may also help with pain. Cortisone injections are an option if these interventions do not seem to make a difference or need more relief.  If cortisone injections do not help, there are different types of shots that may help but they take longer to take effect.  We can discuss this at follow up.  It's important that you continue to stay active. Controlling your weight is important.  Consider physical therapy to strengthen muscles around the joint that hurts to take pressure off of the joint itself. Shoe inserts with good arch support may be helpful.  [employer] [facility] at [employer] [facility] could help.  Water aerobics and cycling with low resistance are the best two types of exercise for arthritis.

## 2013-07-29 NOTE — Progress Notes (Signed)
  CC: Right ankle pain follow up  HPI: Patient is a very pleasant 67 year old female coming in for followup of her right ankle pain. Patient was found to have advanced osteophytic changes of the ankle joint itself. Patient was also found to have a calcific retrocalcaneal bursitis. Patient was given range of motion exercises, discussed over-the-counter orthotics and other modalities including anti-inflammatories. Patient states she did not do anything other than the medications. Patient was concern that if she did the exercises this would make the pain worse and she was not entirely under the understanding what the diagnosis was. Patient would like to discuss them further detail. Patient would like to know how to do the exercises on a regular basis. Patient is adamant she was to get better but she wants to do this safely make sure everything is okay. Denies any new symptoms. Denies any worsening of previous symptoms stated.   Past medical, surgical, family and social history reviewed. Medications reviewed all in the electronic medical record.   Review of Systems: No headache, visual changes, nausea, vomiting, diarrhea, constipation, dizziness, abdominal pain, skin rash, fevers, chills, night sweats, weight loss, swollen lymph nodes, body aches, joint swelling, muscle aches, chest pain, shortness of breath, mood changes.   Objective:    .Blood pressure 152/90, pulse 77, SpO2 98.00%.   General: No apparent distress alert and oriented x3 mood and affect normal, dressed appropriately. Obese.  HEENT: Pupils equal, extraocular movements intact Respiratory: Patient's speak in full sentences and does not appear short of breath Cardiovascular: No lower extremity edema, non tender, no erythema Skin: Warm dry intact with no signs of infection or rash on extremities or on axial skeleton. Abdomen: Soft nontender Neuro: Cranial nerves II through XII are intact, neurovascularly intact in all extremities with 2+  DTRs and 2+ pulses. Lymph: No lymphadenopathy of posterior or anterior cervical chain or axillae bilaterally.  Gait normal with good balance and coordination.  MSK: Non tender with full range of motion and good stability and symmetric strength and tone of shoulders, elbows, wrist, knee and bilaterally.  Ankle: Right No visible erythema trace edema Patient has a decreased range of motion especially with dorsiflexion lacking the last 10. Patient also lacks the last 5 of plantar flexion. Strength is 4/5 in all directions compared to 5 out of 5 on the contralateral side Stable lateral and medial ligaments; squeeze test and kleiger test unremarkable; Talar dome nontender; No pain at base of 5th MT; No tenderness over cuboid; No tenderness over N spot or navicular prominence No tenderness on posterior aspects of lateral and medial malleolus No sign of peroneal tendon subluxations or tenderness to palpation Negative tarsal tunnel tinel's Able to walk 4 steps. She does have some tenderness to palpation over the insertion of the Achilles.   Impression and Recommendations:     This case required medical decision making of moderate complexity.

## 2013-08-18 ENCOUNTER — Ambulatory Visit: Payer: Medicare Other | Attending: Family Medicine

## 2013-08-18 DIAGNOSIS — E119 Type 2 diabetes mellitus without complications: Secondary | ICD-10-CM | POA: Insufficient documentation

## 2013-08-18 DIAGNOSIS — R262 Difficulty in walking, not elsewhere classified: Secondary | ICD-10-CM | POA: Insufficient documentation

## 2013-08-18 DIAGNOSIS — IMO0001 Reserved for inherently not codable concepts without codable children: Secondary | ICD-10-CM | POA: Insufficient documentation

## 2013-08-18 DIAGNOSIS — M25579 Pain in unspecified ankle and joints of unspecified foot: Secondary | ICD-10-CM | POA: Insufficient documentation

## 2013-08-18 DIAGNOSIS — R5381 Other malaise: Secondary | ICD-10-CM | POA: Insufficient documentation

## 2013-08-18 DIAGNOSIS — I10 Essential (primary) hypertension: Secondary | ICD-10-CM | POA: Insufficient documentation

## 2013-08-18 DIAGNOSIS — Z8673 Personal history of transient ischemic attack (TIA), and cerebral infarction without residual deficits: Secondary | ICD-10-CM | POA: Insufficient documentation

## 2013-08-20 ENCOUNTER — Ambulatory Visit: Payer: Medicare Other

## 2013-08-26 ENCOUNTER — Ambulatory Visit: Payer: Medicare Other

## 2013-08-27 ENCOUNTER — Ambulatory Visit: Payer: Medicare Other | Admitting: Internal Medicine

## 2013-08-27 ENCOUNTER — Ambulatory Visit: Payer: Medicare Other | Admitting: Family Medicine

## 2013-08-29 ENCOUNTER — Ambulatory Visit: Payer: Medicare Other | Admitting: Physical Therapy

## 2013-09-02 ENCOUNTER — Ambulatory Visit: Payer: Medicare Other

## 2013-09-09 ENCOUNTER — Ambulatory Visit: Payer: Medicare Other

## 2013-10-06 ENCOUNTER — Ambulatory Visit (INDEPENDENT_AMBULATORY_CARE_PROVIDER_SITE_OTHER): Payer: Medicare Other | Admitting: Internal Medicine

## 2013-10-06 ENCOUNTER — Other Ambulatory Visit (INDEPENDENT_AMBULATORY_CARE_PROVIDER_SITE_OTHER): Payer: Medicare Other

## 2013-10-06 ENCOUNTER — Ambulatory Visit (INDEPENDENT_AMBULATORY_CARE_PROVIDER_SITE_OTHER)
Admission: RE | Admit: 2013-10-06 | Discharge: 2013-10-06 | Disposition: A | Discharge status: Home / Self Care | Payer: Medicare Other | Source: Ambulatory Visit | Attending: Internal Medicine | Admitting: Internal Medicine

## 2013-10-06 ENCOUNTER — Encounter: Payer: Self-pay | Admitting: Internal Medicine

## 2013-10-06 VITALS — BP 132/80 | HR 65 | Temp 97.9°F | Wt 244.0 lb

## 2013-10-06 DIAGNOSIS — R05 Cough: Secondary | ICD-10-CM

## 2013-10-06 DIAGNOSIS — R059 Cough, unspecified: Secondary | ICD-10-CM

## 2013-10-06 DIAGNOSIS — Z1211 Encounter for screening for malignant neoplasm of colon: Secondary | ICD-10-CM

## 2013-10-06 DIAGNOSIS — F3289 Other specified depressive episodes: Secondary | ICD-10-CM

## 2013-10-06 DIAGNOSIS — E119 Type 2 diabetes mellitus without complications: Secondary | ICD-10-CM

## 2013-10-06 DIAGNOSIS — F329 Major depressive disorder, single episode, unspecified: Secondary | ICD-10-CM

## 2013-10-06 LAB — HM DIABETES EYE EXAM

## 2013-10-06 LAB — HEMOGLOBIN A1C: Hgb A1c MFr Bld: 7.9 % — ABNORMAL HIGH (ref 4.6–6.5)

## 2013-10-06 NOTE — Assessment & Plan Note (Signed)
Reviewed again importance of diet and med compliance as rx'd Follows with optho regularly On ASA, ARB and statin Check a1c, microalb and statin as due The current medical regimen is effective;  continue present plan and medications. Lab Results  Component Value Date   HGBA1C 7.6* 05/28/2013

## 2013-10-06 NOTE — Progress Notes (Signed)
Subjective:    Patient ID: Jamie Aguirre, female    DOB: 11-Mar-1946, 68 y.o.   MRN: [id number]  HPI  Here for follow up -  reviewed chronic medical conditions and interval hx  Past Medical History  Diagnosis Date  . Asthma   . Arthritis   . Type 2 diabetes mellitus   . Diverticulitis   . Chronic bronchitis   . Headaches, cluster   . Allergic rhinitis, seasonal     assocaited with vertigo sensation  . Chronic kidney disease   . Hypertension   . Hyperlipidemia   . 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 Pellucid marginal degeneration of cornea   . Hives     idiopathic    Review of Systems  Constitutional: Negative for fatigue and unexpected weight change.  Respiratory: Negative for cough, shortness of breath and wheezing.   Cardiovascular: Negative for chest pain, palpitations and leg swelling.  Gastrointestinal: Negative for nausea, abdominal pain and diarrhea.  Musculoskeletal: Positive for arthralgias (chronic R ankle). Negative for back pain, joint swelling and myalgias.  Neurological: Negative for dizziness, weakness, light-headedness and headaches.  Psychiatric/Behavioral: Negative for dysphoric mood. The patient is not nervous/anxious.        Objective:   Physical Exam BP 132/80  Pulse 65  Temp(Src) 97.9 F (36.6 C) (Oral)  Wt 244 lb (110.678 kg)  SpO2 97% Wt Readings from Last 3 Encounters:  10/06/13 244 lb (110.678 kg)  07/08/13 273 lb (123.832 kg)  07/07/13 274 lb 4 oz (124.399 kg)   Constitutional: She is overweight, but appears well-developed and well-nourished. No acute distress. Neck: Thick. Normal range of motion. Neck supple. No JVD present. No thyromegaly present.  Cardiovascular: Normal rate, regular rhythm and normal heart sounds.  No murmur heard. No BLE edema, but B fatty ankles. Pulmonary/Chest: Effort normal and breath sounds normal. No respiratory distress. She has no wheezes. Musculoskeletal: Chronic enlargement of right ankle with  bony changes, no effusion. Full range of motion with ligamentous function intact, but painful. Neurovascularly intact. No other gross deformities Psychiatric: She has a dysphoric mood and affect. Her behavior is normal. Judgment and thought content normal.   Lab Results  Component Value Date   WBC 8.3 05/28/2013   HGB 12.9 05/28/2013   HCT 38.0 05/28/2013   PLT 296.0 05/28/2013   GLUCOSE 149* 05/28/2013   CHOL 183 02/20/2013   TRIG 223.0* 02/20/2013   HDL 47.60 02/20/2013   LDLDIRECT 113.4 02/20/2013   LDLCALC 80 01/01/2012   ALT 22 05/28/2013   AST 15 05/28/2013   NA 137 05/28/2013   K 4.6 05/28/2013   CL 102 05/28/2013   CREATININE 1.3* 05/28/2013   BUN 25* 05/28/2013   CO2 26 05/28/2013   TSH 3.79 05/28/2013   HGBA1C 7.6* 05/28/2013   MICROALBUR 7.0* 02/20/2013   Dg Ankle Complete Right  07/07/2013   CLINICAL DATA:  Right ankle pain and swelling  EXAM: RIGHT ANKLE - COMPLETE 3+ VIEW  COMPARISON:  None.  FINDINGS: Diffuse soft tissue prominence. Moderate arthritic change of the right ankle joint with joint space loss, sclerosis and spurring of the tibiotalar joint. No acute fracture or malalignment. Possible small loose joint body noted posteriorly on the lateral view.  IMPRESSION: Degenerative changes.  No acute osseous finding.   Electronically Signed   By: Ruel Favors M.D.   On: 07/07/2013 13:52   [place] Extrem Low Right Ltd  07/08/2013   MSK US performed of: Right ankle  This study was ordered, performed, and interpreted by Terrilee FilesZach Smith D.O.  Foot/Ankle:  All structures visualized.  Talar dome unremarkable  Ankle mortise have significant osteophytic changes with bone spurring  anteriorly mostly.  Peroneus longus and brevis tendons unremarkable on long and transverse  views without sheath effusions.  Posterior tibialis, flexor hallucis longus, and flexor digitorum longus  tendons unremarkable on long and transverse views without sheath  effusions.  Achilles tendon visualized along length  of tendon and unremarkable on long  and transverse views without sheath effusion. Patient though does have a  retrocalcaneal bursitis that is calcified the  Anterior Talofibular Ligament and Calcaneofibular Ligaments unremarkable  and intact.  Deltoid Ligament unremarkable and intact.  Plantar fascia intact and without effusion, normal thickness.  No increased doppler signal, cap sign, or thickening of tibial cortex.  Power doppler signal normal.   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      Assessment & Plan:   Cough - related to chronic bronchitis - no evidence for infection. Reviewed ongoing treatment for allergic rhinitis. Check chest x-ray to exclude infiltrate given green sputum, hold antibiotics unless abnormal chest x-ray  Problem List Items Addressed This Visit   Depressive disorder     Low-grade symptoms of dysthymia -Exacerbated by situational stressors and family friends illness reviewed depression symptoms and episode of panic attack 09/2012 Declined to begin generic paxil as prescribed 09/2012 due to feared side effects (potential to exac dizziness)  Previously in counseling, declines need for return to same 06/2013 trial Cymbalta, esp with chronic pain, never begun because of potential side effects (dizziness)    Type 2 diabetes mellitus - Primary      Reviewed again importance of diet and med compliance as rx'd Follows with optho regularly On ASA, ARB and statin Check a1c, microalb and statin as due The current medical regimen is effective;  continue present plan and medications. Lab Results  Component Value Date   HGBA1C 7.6* 05/28/2013      Relevant Orders      Hemoglobin A1c      Amb ref to Medical Nutrition Therapy-MNT    Other Visit Diagnoses   Cough        Relevant Orders       DG Chest 2 View    Special screening for malignant neoplasms, colon

## 2013-10-06 NOTE — Patient Instructions (Addendum)
It was good to see you today.  We have reviewed your prior records including labs and tests today  Test(s) ordered today. Your results will be released to [place] (or called to you) after review, usually within 72hours after test completion. If any changes need to be made, you will be notified at that same time.  Medications reviewed and updated, no changes recommended at this time.  We'll have you screened for colon cancer with [facility] (stool DNA testing) - this packet will be sent to your home by the testing company. Complete instructions as in the box, and we will contact you with the results once available.  we'll make referral for nutrition. Our office will contact you regarding appointment(s) once made.  Please schedule followup in 4 months, call sooner if problems.  Diabetes and Standards of Medical Care  Diabetes is complicated. You may find that your diabetes team includes a dietitian, nurse, diabetes educator, eye doctor, and more. To help everyone know what is going on and to help you get the care you deserve, the following schedule of care was developed to help keep you on track. Below are the tests, exams, vaccines, medicines, education, and plans you will need. HbA1c test This test shows how well you have controlled your glucose over the past 2 3 months. It is used to see if your diabetes management plan needs to be adjusted.   It is performed at least 2 times a year if you are meeting treatment goals.  It is performed 4 times a year if therapy has changed or if you are not meeting treatment goals. Blood pressure test  This test is performed at every routine medical visit. The goal is less than 140/90 mmHg for most people, but 130/80 mmHg in some cases. Ask your health care provider about your goal. Dental exam  Follow up with the dentist regularly. Eye exam  If you are diagnosed with type 1 diabetes as a child, get an exam upon reaching the age of 17 years or older and  have had diabetes for 3 5 years. Yearly eye exams are recommended after that initial eye exam.  If you are diagnosed with type 1 diabetes as an adult, get an exam within 5 years of diagnosis and then yearly.  If you are diagnosed with type 2 diabetes, get an exam as soon as possible after the diagnosis and then yearly. Foot care exam  Visual foot exams are performed at every routine medical visit. The exams check for cuts, injuries, or other problems with the feet.  A comprehensive foot exam should be done yearly. This includes visual inspection as well as assessing foot pulses and testing for loss of sensation.  Check your feet nightly for cuts, injuries, or other problems with your feet. Tell your health care provider if anything is not healing. Kidney function test (urine microalbumin)  This test is performed once a year.  Type 1 diabetes: The first test is performed 5 years after diagnosis.  Type 2 diabetes: The first test is performed at the time of diagnosis.  A serum creatinine and estimated glomerular filtration rate (eGFR) test is done once a year to assess the level of chronic kidney disease (CKD), if present. Lipid profile (cholesterol, HDL, LDL, triglycerides)  Performed every 5 years for most people.  The goal for LDL is less than 100 mg/dL. If you are at high risk, the goal is less than 70 mg/dL.  The goal for HDL is 40 mg/dL 50  mg/dL for men and 50 mg/dL 60 mg/dL for women. An HDL cholesterol of 60 mg/dL or higher gives some protection against heart disease.  The goal for triglycerides is less than 150 mg/dL. Influenza vaccine, pneumococcal vaccine, and hepatitis B vaccine  The influenza vaccine is recommended yearly.  The pneumococcal vaccine is generally given once in a lifetime. However, there are some instances when another vaccination is recommended. Check with your health care provider.  The hepatitis B vaccine is also recommended for adults with  diabetes. Diabetes self-management education  Education is recommended at diagnosis and ongoing as needed. Treatment plan  Your treatment plan is reviewed at every medical visit. Document Released: 05/28/2009 Document Revised: 04/02/2013 Document Reviewed: 12/31/2012 [facility] Patient Information 2014 [place]. Diabetes and Standards of Medical Care  Diabetes is complicated. You may find that your diabetes team includes a dietitian, nurse, diabetes educator, eye doctor, and more. To help everyone know what is going on and to help you get the care you deserve, the following schedule of care was developed to help keep you on track. Below are the tests, exams, vaccines, medicines, education, and plans you will need. HbA1c test This test shows how well you have controlled your glucose over the past 2 3 months. It is used to see if your diabetes management plan needs to be adjusted.   It is performed at least 2 times a year if you are meeting treatment goals.  It is performed 4 times a year if therapy has changed or if you are not meeting treatment goals. Blood pressure test  This test is performed at every routine medical visit. The goal is less than 140/90 mmHg for most people, but 130/80 mmHg in some cases. Ask your health care provider about your goal. Dental exam  Follow up with the dentist regularly. Eye exam  If you are diagnosed with type 1 diabetes as a child, get an exam upon reaching the age of 67 years or older and have had diabetes for 3 5 years. Yearly eye exams are recommended after that initial eye exam.  If you are diagnosed with type 1 diabetes as an adult, get an exam within 5 years of diagnosis and then yearly.  If you are diagnosed with type 2 diabetes, get an exam as soon as possible after the diagnosis and then yearly. Foot care exam  Visual foot exams are performed at every routine medical visit. The exams check for cuts, injuries, or other problems with the  feet.  A comprehensive foot exam should be done yearly. This includes visual inspection as well as assessing foot pulses and testing for loss of sensation.  Check your feet nightly for cuts, injuries, or other problems with your feet. Tell your health care provider if anything is not healing. Kidney function test (urine microalbumin)  This test is performed once a year.  Type 1 diabetes: The first test is performed 5 years after diagnosis.  Type 2 diabetes: The first test is performed at the time of diagnosis.  A serum creatinine and estimated glomerular filtration rate (eGFR) test is done once a year to assess the level of chronic kidney disease (CKD), if present. Lipid profile (cholesterol, HDL, LDL, triglycerides)  Performed every 5 years for most people.  The goal for LDL is less than 100 mg/dL. If you are at high risk, the goal is less than 70 mg/dL.  The goal for HDL is 40 mg/dL 50 mg/dL for men and 50 mg/dL 60 mg/dL  for women. An HDL cholesterol of 60 mg/dL or higher gives some protection against heart disease.  The goal for triglycerides is less than 150 mg/dL. Influenza vaccine, pneumococcal vaccine, and hepatitis B vaccine  The influenza vaccine is recommended yearly.  The pneumococcal vaccine is generally given once in a lifetime. However, there are some instances when another vaccination is recommended. Check with your health care provider.  The hepatitis B vaccine is also recommended for adults with diabetes. Diabetes self-management education  Education is recommended at diagnosis and ongoing as needed. Treatment plan  Your treatment plan is reviewed at every medical visit. Document Released: 05/28/2009 Document Revised: 04/02/2013 Document Reviewed: 12/31/2012 [facility] Patient Information 2014 [place].

## 2013-10-06 NOTE — Progress Notes (Signed)
Subjective:    Patient ID: Jamie KaufmannLinda Aguirre, female    DOB: 1946/03/08, 68 y.o.   MRN: [id number]  HPI  Patient here today for follow up.  Chronic medical issues and interval history reviewed.   Past Medical History  Diagnosis Date  . Asthma   . Arthritis   . Type 2 diabetes mellitus   . Diverticulitis   . Chronic bronchitis   . Headaches, cluster   . Allergic rhinitis, seasonal     assocaited with vertigo sensation  . Chronic kidney disease   . Hypertension   . Hyperlipidemia   . 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 Pellucid marginal degeneration of cornea   . Hives     idiopathic     Review of Systems  Constitutional: Negative for fever, chills, activity change and appetite change.  HENT: Positive for congestion, postnasal drip and sinus pressure. Negative for rhinorrhea and sneezing.   Respiratory: Positive for cough (chronic - green/yellow sputum) and wheezing (chronic). Negative for choking, chest tightness and shortness of breath.   Cardiovascular: Negative for chest pain, palpitations and leg swelling.  Gastrointestinal: Negative for nausea, vomiting, diarrhea and constipation.  Endocrine: Negative for polydipsia, polyphagia and polyuria.  Musculoskeletal: Positive for arthralgias (right ankle pain - chronic).  Neurological: Positive for dizziness (chronic). Negative for syncope, weakness and headaches.       Objective:   Physical Exam  Constitutional: She is oriented to person, place, and time. No distress.  Obese  HENT:  Head: Normocephalic and atraumatic.  Nose: Nose normal.  erythemous oropharynx  Neck: Normal range of motion. Neck supple. No thyromegaly present.  Cardiovascular: Normal rate, regular rhythm and normal heart sounds.   No murmur heard. Pulmonary/Chest: Effort normal and breath sounds normal. No respiratory distress. She has no wheezes.  Musculoskeletal: Normal range of motion. She exhibits no edema and no tenderness.    Lymphadenopathy:    She has no cervical adenopathy.  Neurological: She is alert and oriented to person, place, and time.  Skin: Skin is warm and dry. No rash noted. She is not diaphoretic.  Psychiatric: She has a normal mood and affect. Her behavior is normal. Judgment and thought content normal.     Wt Readings from Last 3 Encounters:  10/06/13 244 lb (110.678 kg)  07/08/13 273 lb (123.832 kg)  07/07/13 274 lb 4 oz (124.399 kg)   BP Readings from Last 3 Encounters:  10/06/13 132/80  07/29/13 152/90  07/08/13 142/82   Lab Results  Component Value Date   WBC 8.3 05/28/2013   HGB 12.9 05/28/2013   HCT 38.0 05/28/2013   PLT 296.0 05/28/2013   GLUCOSE 149* 05/28/2013   CHOL 183 02/20/2013   TRIG 223.0* 02/20/2013   HDL 47.60 02/20/2013   LDLDIRECT 113.4 02/20/2013   LDLCALC 80 01/01/2012   ALT 22 05/28/2013   AST 15 05/28/2013   NA 137 05/28/2013   K 4.6 05/28/2013   CL 102 05/28/2013   CREATININE 1.3* 05/28/2013   BUN 25* 05/28/2013   CO2 26 05/28/2013   TSH 3.79 05/28/2013   HGBA1C 7.6* 05/28/2013   MICROALBUR 7.0* 02/20/2013       Assessment & Plan:   See problem list -   I have personally reviewed this case with NP student. I also personally examined this patient. I agree with history and findings as documented above. I reviewed, discussed and approve of the assessment and plan as listed above. Rene PaciValerie Leschber, MD

## 2013-10-06 NOTE — Progress Notes (Signed)
Pre-visit discussion using our clinic review tool. No additional management support is needed unless otherwise documented below in the visit note.  

## 2013-10-06 NOTE — Assessment & Plan Note (Signed)
Low-grade symptoms of dysthymia -Exacerbated by situational stressors and family friends illness reviewed depression symptoms and episode of panic attack 09/2012 Declined to begin generic paxil as prescribed 09/2012 due to feared side effects (potential to exac dizziness)  Previously in counseling, declines need for return to same 06/2013 trial Cymbalta, esp with chronic pain, never begun because of potential side effects (dizziness)

## 2013-10-07 MED ORDER — LINAGLIPTIN-METFORMIN HCL 2.5-1000 MG PO TABS: 1.0000 | ORAL_TABLET | Freq: Two times a day (BID) | ORAL | Status: DC

## 2013-10-07 NOTE — Addendum Note (Signed)
Addended by: Rene PaciLESCHBER, VALERIE A on: 10/07/2013 10:30 AM   Modules accepted: Orders, Medications

## 2013-10-14 ENCOUNTER — Telehealth: Payer: Self-pay | Admitting: *Deleted

## 2013-10-14 NOTE — Telephone Encounter (Signed)
Aurther Lofterry, with [facility] phoned needing an ICD9 dx code   CB# [phone]

## 2013-10-14 NOTE — Telephone Encounter (Signed)
Phoned lab and relayed icd9 code.

## 2013-10-14 NOTE — Telephone Encounter (Signed)
Dx code for Cologuard- V76.51

## 2013-10-29 ENCOUNTER — Telehealth: Payer: Self-pay | Admitting: *Deleted

## 2013-10-29 MED ORDER — METFORMIN HCL 500 MG PO TABS: 500.0000 mg | ORAL_TABLET | Freq: Two times a day (BID) | ORAL | Status: DC

## 2013-10-29 NOTE — Telephone Encounter (Signed)
Done erx 

## 2013-10-29 NOTE — Telephone Encounter (Signed)
Pt called states her Rx for Jentadueto was increased from 2.5-500mg  to 2.5-1000mg .  Pt is requesting a Rxl for 2 months of Metformin 500mg  so that she may be able to finish the 2.5-500mg  of Jentadueto.  Please advise in Dr Diamantina MonksLeschber's absence

## 2013-10-30 NOTE — Telephone Encounter (Signed)
Spoke with pt advised Rx sent 

## 2013-11-20 ENCOUNTER — Encounter: Payer: Self-pay | Admitting: *Deleted

## 2013-11-20 ENCOUNTER — Encounter: Payer: Medicare Other | Attending: Internal Medicine | Admitting: *Deleted

## 2013-11-20 VITALS — Ht 64.0 in | Wt 240.8 lb

## 2013-11-20 DIAGNOSIS — I69919 Unspecified symptoms and signs involving cognitive functions following unspecified cerebrovascular disease: Secondary | ICD-10-CM | POA: Insufficient documentation

## 2013-11-20 DIAGNOSIS — E119 Type 2 diabetes mellitus without complications: Secondary | ICD-10-CM | POA: Insufficient documentation

## 2013-11-20 DIAGNOSIS — Z713 Dietary counseling and surveillance: Secondary | ICD-10-CM | POA: Insufficient documentation

## 2013-11-20 NOTE — Patient Instructions (Signed)
Plan:  Aim for 2-3 Carb Choices per meal (30-45 grams) +/- 1 either way  Aim for 0-15 Carbs per snack if hungry  Consider reading food labels for Total Carbohydrate and Fat Grams of foods Consider  increasing your activity level by 30 minutes daily as tolerated Consider checking BG at alternate times per day as directed by MD  Try your best to take medication as directed by MD  Try to decrease the amounts of fruit in your daily diet Try to decrease the amount of desserts in your day Try to decrease the amount of bread in your day  Good job with your meal choices.. Low rice amounts

## 2013-11-20 NOTE — Progress Notes (Signed)
Appt start time: 0800 end time:  0930.  Assessment:  Patient was seen on  11/20/13 for individual diabetes education. Jamie Aguirre has ben T2DM for approximately 2 years. She has had two strokes (2007) which appear to have affected her memory. She notes that she lacks reference to time. She frequently forgets to take her medications. She is unable to read and retain / comprehend fully.  This is not something that she wants to address with her PCP. She has a friend living with her that is undergoing chemo treatment for cancer. Jamie Aguirre eats many of her meals out. After review of her present dietary intake I feel that her consumption of bread, fruit and deserts are contributing to her elevated glucose and weight.  Current HbA1c: 7.9%  Preferred Learning Style:   Auditory  Visual  Hands on  Learning Readiness:   Contemplating  MEDICATIONS: Metformin 500mg  BID,  Jentadueto 25-1000 BID (3000mg  Metformin daily when she remembers to take it.)  DIETARY INTAKE:  B ( AM): [employer] Burrito, black coffee (26g) .... Protein shake (rice protein, flax seed blueberries 1C, 1 banana, almond milk Snk ( AM): fruit  L ( PM): Chinese; Brown Rice 2/3C, vegetables.... [place] chicken & pork, salad vegets, rice 1C, cucumber sauce Snk ( PM): fruit, nuts D ( PM): Homemade soup, salade Snk ( PM): cookies, sherbert, skinny cow ice cream  Usual physical activity: unable to walk due to weight and foot pain  Intervention:  Nutrition counseling provided.  Discussed diabetes disease process and treatment options.  Discussed physiology of diabetes and role of obesity on insulin resistance.  Encouraged moderate weight reduction to improve glucose levels.  Discussed role of medications and diet in glucose control  Provided education on macronutrients on glucose levels.  Provided education on carb counting, importance of regularly scheduled meals/snacks, and meal planning  Reviewed patient medications.  Discussed role of  medication on blood glucose and possible side effects  Plan:  Aim for 2-3 Carb Choices per meal (30-45 grams) +/- 1 either way  Aim for 0-15 Carbs per snack if hungry  Consider reading food labels for Total Carbohydrate and Fat Grams of foods Consider  increasing your activity level by 30 minutes daily as tolerated Consider checking BG at alternate times per day as directed by MD  Try your best to take medication as directed by MD  Try to decrease the amounts of fruit in your daily diet Try to decrease the amount of desserts in your day Try to decrease the amount of bread in your day  Good job with your meal choices.. Low rice amounts  Teaching Method Utilized:  Visual Auditory Hands on  Handouts given during visit include: Meal Plan Card My Plate Method  Barriers to learning/adherence to lifestyle change: Memory  Diabetes self-care support plan:   [facility] support group  CDE  PCP  Demonstrated degree of understanding via:  Teach Back   Monitoring/Evaluation:  Dietary intake, and body weight in 6 week(s).

## 2013-12-04 ENCOUNTER — Other Ambulatory Visit: Payer: Self-pay | Admitting: Internal Medicine

## 2013-12-25 ENCOUNTER — Other Ambulatory Visit: Payer: Self-pay | Admitting: Internal Medicine

## 2014-01-06 ENCOUNTER — Ambulatory Visit: Payer: Medicare Other | Admitting: *Deleted

## 2014-01-19 ENCOUNTER — Other Ambulatory Visit: Payer: Self-pay | Admitting: Internal Medicine

## 2014-01-19 MED ORDER — GLUCOSE BLOOD VI STRP: ORAL_STRIP | Status: DC

## 2014-01-21 ENCOUNTER — Encounter: Payer: Self-pay | Admitting: Internal Medicine

## 2014-01-29 ENCOUNTER — Other Ambulatory Visit: Payer: Self-pay | Admitting: Internal Medicine

## 2014-02-17 ENCOUNTER — Other Ambulatory Visit (INDEPENDENT_AMBULATORY_CARE_PROVIDER_SITE_OTHER): Payer: Medicare Other

## 2014-02-17 ENCOUNTER — Encounter: Payer: Self-pay | Admitting: Internal Medicine

## 2014-02-17 ENCOUNTER — Ambulatory Visit (INDEPENDENT_AMBULATORY_CARE_PROVIDER_SITE_OTHER): Payer: Medicare Other | Admitting: Internal Medicine

## 2014-02-17 VITALS — BP 142/90 | HR 88 | Temp 98.0°F | Ht 64.0 in | Wt 240.0 lb

## 2014-02-17 DIAGNOSIS — E119 Type 2 diabetes mellitus without complications: Secondary | ICD-10-CM

## 2014-02-17 DIAGNOSIS — I10 Essential (primary) hypertension: Secondary | ICD-10-CM

## 2014-02-17 DIAGNOSIS — E785 Hyperlipidemia, unspecified: Secondary | ICD-10-CM

## 2014-02-17 DIAGNOSIS — J410 Simple chronic bronchitis: Secondary | ICD-10-CM

## 2014-02-17 LAB — LIPID PANEL
Cholesterol: 133 mg/dL (ref 0–200)
HDL: 51.3 mg/dL (ref >39.00)
LDL CALC: 50 mg/dL (ref 0–99)
NonHDL: 81.7
Total CHOL/HDL Ratio: 3
Triglycerides: 157 mg/dL — ABNORMAL HIGH (ref 0.0–149.0)
VLDL: 31.4 mg/dL (ref 0.0–40.0)

## 2014-02-17 LAB — BASIC METABOLIC PANEL
BUN: 26 mg/dL — ABNORMAL HIGH (ref 6–23)
CO2: 27 mEq/L (ref 19–32)
CREATININE: 1.1 mg/dL (ref 0.4–1.2)
Calcium: 9.3 mg/dL (ref 8.4–10.5)
Chloride: 104 mEq/L (ref 96–112)
GFR: 52.16 mL/min — AB (ref >60.00)
Glucose, Bld: 163 mg/dL — ABNORMAL HIGH (ref 70–99)
Potassium: 4.9 mEq/L (ref 3.5–5.1)
Sodium: 138 mEq/L (ref 135–145)

## 2014-02-17 LAB — HEMOGLOBIN A1C: Hgb A1c MFr Bld: 7.7 % — ABNORMAL HIGH (ref 4.6–6.5)

## 2014-02-17 MED ORDER — GUAIFENESIN ER 1200 MG PO TB12: 1200.0000 mg | ORAL_TABLET | Freq: Two times a day (BID) | ORAL | Status: DC

## 2014-02-17 NOTE — Progress Notes (Signed)
Subjective:    Patient ID: Jamie KaufmannLinda Aguirre, female    DOB: [birth year], 68 y.o.   MRN: [id number]  HPI  Patient is here for follow up  Reviewed chronic medical issues and interval medical events  Past Medical History  Diagnosis Date  . Asthma   . Arthritis   . Type 2 diabetes mellitus   . Diverticulitis   . Chronic bronchitis   . Headaches, cluster   . Allergic rhinitis, seasonal     assocaited with vertigo sensation  . Chronic kidney disease   . Hypertension   . Hyperlipidemia   . 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 Pellucid marginal degeneration of cornea   . Hives     idiopathic    Review of Systems  Constitutional: Positive for fatigue. Negative for unexpected weight change.  Respiratory: Positive for cough. Negative for shortness of breath and wheezing.   Cardiovascular: Negative for chest pain and leg swelling.  Musculoskeletal:       R hum fx, accidental fall 2 weeks ago - follows with ortho for same       Objective:   Physical Exam  BP 142/90  Pulse 88  Temp(Src) 98 F (36.7 C) (Oral)  Ht 5\' 4"  (1.626 m)  Wt 240 lb (108.863 kg)  BMI 41.18 kg/m2  SpO2 96% Wt Readings from Last 3 Encounters:  02/17/14 240 lb (108.863 kg)  11/20/13 240 lb 12.8 oz (109.226 kg)  10/06/13 244 lb (110.678 kg)   Constitutional: She is obese, but appears well-developed and well-nourished. No distress.  Neck: Normal range of motion. Neck supple. No JVD present. No thyromegaly present.  Cardiovascular: Normal rate, regular rhythm and normal heart sounds.  No murmur heard. No BLE edema. Pulmonary/Chest: Effort normal and breath sounds normal. No respiratory distress. She has no wheezes.  Mskel: wearing arm sling RUE (s/p hum fx - following with ortho for same) Psychiatric: She has a normal mood and affect. Her behavior is normal. Judgment and thought content normal.   Lab Results  Component Value Date   WBC 8.3 05/28/2013   HGB 12.9 05/28/2013   HCT 38.0 05/28/2013     PLT 296.0 05/28/2013   GLUCOSE 149* 05/28/2013   CHOL 183 02/20/2013   TRIG 223.0* 02/20/2013   HDL 47.60 02/20/2013   LDLDIRECT 113.4 02/20/2013   LDLCALC 80 01/01/2012   ALT 22 05/28/2013   AST 15 05/28/2013   NA 137 05/28/2013   K 4.6 05/28/2013   CL 102 05/28/2013   CREATININE 1.3* 05/28/2013   BUN 25* 05/28/2013   CO2 26 05/28/2013   TSH 3.79 05/28/2013   HGBA1C 7.9* 10/06/2013   MICROALBUR 7.0* 02/20/2013    Dg Chest 2 View  10/06/2013   CLINICAL DATA:  Chronic productive cough, history of asthma, former smoker  EXAM: CHEST  2 VIEW  COMPARISON:  DG CHEST 2 VIEW dated 07/19/2012  FINDINGS: Grossly unchanged enlarged cardiac silhouette and mediastinal contours. The lungs are hyperexpanded with mild diffuse slightly nodular thickening of the pulmonary interstitium. Grossly unchanged minimal left basilar heterogeneous opacities favored to represent atelectasis or scar. No new focal airspace opacities. No pleural effusion or pneumothorax. No definite evidence of edema. Mild scoliotic curvature of the thoracolumbar spine. Stigmata of DISH within the thoracic spine.  IMPRESSION: 1. Mild lung hyperexpansion and bronchitic change without acute cardiopulmonary disease. 2. Grossly unchanged left basilar opacities favored to represent atelectasis or scar.   Electronically Signed   By: Jonny RuizJohn  Watts M.D.   On: 10/06/2013 11:50       Assessment & Plan:   Problem List Items Addressed This Visit   Chronic bronchitis     CXR 09/2013 reviewed - chronic mucus with cough AM and PM recommended Mucinex BID as declines inhalers at this time    Hyperlipidemia     On statin - unable to tolerate higher dose due to GI side effects  Check lipids annually    Relevant Orders      Lipid panel   Hypertension      BP Readings from Last 3 Encounters:  02/17/14 142/90  10/06/13 132/80  07/29/13 152/90   The current medical regimen is generally effective;  continue present plan and medications.       Relevant Orders      Basic metabolic panel      Lipid panel   Type 2 diabetes mellitus - Primary      Reviewed again importance of diet and med compliance as rx'd Increased metformin dose 09/2013 (combination w/ linagliptin) Follows with optho regularly On ASA, ARB and statin Check a1c, microalb and statin annually  Lab Results  Component Value Date   HGBA1C 7.9* 10/06/2013      Relevant Orders      Hemoglobin A1c      Basic metabolic panel      Lipid panel

## 2014-02-17 NOTE — Assessment & Plan Note (Signed)
On statin - unable to tolerate higher dose due to GI side effects  Check lipids annually

## 2014-02-17 NOTE — Assessment & Plan Note (Signed)
Reviewed again importance of diet and med compliance as rx'd Increased metformin dose 09/2013 (combination w/ linagliptin) Follows with optho regularly On ASA, ARB and statin Check a1c, microalb and statin annually  Lab Results  Component Value Date   HGBA1C 7.9* 10/06/2013

## 2014-02-17 NOTE — Assessment & Plan Note (Signed)
BP Readings from Last 3 Encounters:  02/17/14 142/90  10/06/13 132/80  07/29/13 152/90   The current medical regimen is generally effective;  continue present plan and medications.

## 2014-02-17 NOTE — Progress Notes (Signed)
Pre visit review using our clinic review tool, if applicable. No additional management support is needed unless otherwise documented below in the visit note. 

## 2014-02-17 NOTE — Patient Instructions (Addendum)
It was good to see you today.  We have reviewed your prior records including labs and tests today  Test(s) ordered today. Your results will be released to [place] (or called to you) after review, usually within 72hours after test completion. If any changes need to be made, you will be notified at that same time.  Medications reviewed and updated, no changes recommended at this time.  Please schedule followup in 6 months for annul exam/labs, call sooner if problems.  Diabetes and Standards of Medical Care  Diabetes is complicated. You may find that your diabetes team includes a dietitian, nurse, diabetes educator, eye doctor, and more. To help everyone know what is going on and to help you get the care you deserve, the following schedule of care was developed to help keep you on track. Below are the tests, exams, vaccines, medicines, education, and plans you will need. HbA1c test This test shows how well you have controlled your glucose over the past 2-3 months. It is used to see if your diabetes management plan needs to be adjusted.   It is performed at least 2 times a year if you are meeting treatment goals.  It is performed 4 times a year if therapy has changed or if you are not meeting treatment goals. Blood pressure test  This test is performed at every routine medical visit. The goal is less than 140/90 mmHg for most people, but 130/80 mmHg in some cases. Ask your health care provider about your goal. Dental exam  Follow up with the dentist regularly. Eye exam  If you are diagnosed with type 1 diabetes as a child, get an exam upon reaching the age of 56 years or older and have had diabetes for 3-5 years. Yearly eye exams are recommended after that initial eye exam.  If you are diagnosed with type 1 diabetes as an adult, get an exam within 5 years of diagnosis and then yearly.  If you are diagnosed with type 2 diabetes, get an exam as soon as possible after the diagnosis and then  yearly. Foot care exam  Visual foot exams are performed at every routine medical visit. The exams check for cuts, injuries, or other problems with the feet.  A comprehensive foot exam should be done yearly. This includes visual inspection as well as assessing foot pulses and testing for loss of sensation.  Check your feet nightly for cuts, injuries, or other problems with your feet. Tell your health care provider if anything is not healing. Kidney function test (urine microalbumin)  This test is performed once a year.  Type 1 diabetes: The first test is performed 5 years after diagnosis.  Type 2 diabetes: The first test is performed at the time of diagnosis.  A serum creatinine and estimated glomerular filtration rate (eGFR) test is done once a year to assess the level of chronic kidney disease (CKD), if present. Lipid profile (cholesterol, HDL, LDL, triglycerides)  Performed every 5 years for most people.  The goal for LDL is less than 100 mg/dL. If you are at high risk, the goal is less than 70 mg/dL.  The goal for HDL is 40 mg/dL-50 mg/dL for men and 50 mg/dL-60 mg/dL for women. An HDL cholesterol of 60 mg/dL or higher gives some protection against heart disease.  The goal for triglycerides is less than 150 mg/dL. Influenza vaccine, pneumococcal vaccine, and hepatitis B vaccine  The influenza vaccine is recommended yearly.  The pneumococcal vaccine is generally given once in  a lifetime. However, there are some instances when another vaccination is recommended. Check with your health care provider.  The hepatitis B vaccine is also recommended for adults with diabetes. Diabetes self-management education  Education is recommended at diagnosis and ongoing as needed. Treatment plan  Your treatment plan is reviewed at every medical visit. Document Released: 05/28/2009 Document Revised: 04/02/2013 Document Reviewed: 12/31/2012 [facility] Patient Information 2015 [place], [place].  This information is not intended to replace advice given to you by your health care provider. Make sure you discuss any questions you have with your health care provider.

## 2014-02-17 NOTE — Assessment & Plan Note (Signed)
CXR 09/2013 reviewed - chronic mucus with cough AM and PM recommended Mucinex BID as declines inhalers at this time

## 2014-02-18 ENCOUNTER — Other Ambulatory Visit: Payer: Self-pay | Admitting: Orthopedic Surgery

## 2014-02-18 DIAGNOSIS — S42201D Unspecified fracture of upper end of right humerus, subsequent encounter for fracture with routine healing: Secondary | ICD-10-CM

## 2014-02-23 ENCOUNTER — Ambulatory Visit
Admission: RE | Admit: 2014-02-23 | Discharge: 2014-02-23 | Disposition: A | Discharge status: Home / Self Care | Payer: Medicare Other | Source: Ambulatory Visit | Attending: Orthopedic Surgery | Admitting: Orthopedic Surgery

## 2014-02-23 DIAGNOSIS — S42201D Unspecified fracture of upper end of right humerus, subsequent encounter for fracture with routine healing: Secondary | ICD-10-CM

## 2014-02-26 ENCOUNTER — Other Ambulatory Visit: Payer: Self-pay | Admitting: Internal Medicine

## 2014-02-26 ENCOUNTER — Telehealth: Payer: Self-pay | Admitting: Internal Medicine

## 2014-02-26 NOTE — Telephone Encounter (Signed)
Pt call request lab result that was done 02/17/14. Please call pt [phone]

## 2014-02-27 NOTE — Telephone Encounter (Signed)
Pt call back requesting results from lab. Inform pt md release results to mychart. She stated she doesn't use mychart she would like to speak with humans. Gave pt md response. Ask pt did she want her mychart deactivated she said yes because she would like to speak with a person...[name]/lmb

## 2014-03-10 ENCOUNTER — Ambulatory Visit: Payer: Medicare Other | Attending: Orthopedic Surgery

## 2014-03-10 DIAGNOSIS — M25519 Pain in unspecified shoulder: Secondary | ICD-10-CM | POA: Diagnosis not present

## 2014-03-10 DIAGNOSIS — R5381 Other malaise: Secondary | ICD-10-CM | POA: Diagnosis not present

## 2014-03-10 DIAGNOSIS — M25619 Stiffness of unspecified shoulder, not elsewhere classified: Secondary | ICD-10-CM | POA: Diagnosis not present

## 2014-03-10 DIAGNOSIS — IMO0001 Reserved for inherently not codable concepts without codable children: Secondary | ICD-10-CM | POA: Insufficient documentation

## 2014-03-11 ENCOUNTER — Ambulatory Visit: Payer: Medicare Other | Admitting: Physical Therapy

## 2014-03-11 DIAGNOSIS — IMO0001 Reserved for inherently not codable concepts without codable children: Secondary | ICD-10-CM | POA: Diagnosis not present

## 2014-03-17 ENCOUNTER — Ambulatory Visit: Payer: Medicare Other | Attending: Orthopedic Surgery | Admitting: Physical Therapy

## 2014-03-17 DIAGNOSIS — M25619 Stiffness of unspecified shoulder, not elsewhere classified: Secondary | ICD-10-CM | POA: Insufficient documentation

## 2014-03-17 DIAGNOSIS — R5381 Other malaise: Secondary | ICD-10-CM | POA: Insufficient documentation

## 2014-03-17 DIAGNOSIS — M25519 Pain in unspecified shoulder: Secondary | ICD-10-CM | POA: Insufficient documentation

## 2014-03-17 DIAGNOSIS — IMO0001 Reserved for inherently not codable concepts without codable children: Secondary | ICD-10-CM | POA: Insufficient documentation

## 2014-03-19 ENCOUNTER — Ambulatory Visit: Payer: Medicare Other

## 2014-03-19 DIAGNOSIS — M25519 Pain in unspecified shoulder: Secondary | ICD-10-CM | POA: Diagnosis not present

## 2014-03-19 DIAGNOSIS — R5381 Other malaise: Secondary | ICD-10-CM | POA: Diagnosis not present

## 2014-03-19 DIAGNOSIS — M25619 Stiffness of unspecified shoulder, not elsewhere classified: Secondary | ICD-10-CM | POA: Diagnosis not present

## 2014-03-19 DIAGNOSIS — IMO0001 Reserved for inherently not codable concepts without codable children: Secondary | ICD-10-CM | POA: Diagnosis not present

## 2014-03-24 ENCOUNTER — Ambulatory Visit: Payer: Medicare Other | Admitting: Physical Therapy

## 2014-03-24 DIAGNOSIS — IMO0001 Reserved for inherently not codable concepts without codable children: Secondary | ICD-10-CM | POA: Diagnosis not present

## 2014-03-25 ENCOUNTER — Other Ambulatory Visit: Payer: Self-pay | Admitting: Internal Medicine

## 2014-03-26 ENCOUNTER — Ambulatory Visit: Payer: Medicare Other | Admitting: Physical Therapy

## 2014-03-26 DIAGNOSIS — IMO0001 Reserved for inherently not codable concepts without codable children: Secondary | ICD-10-CM | POA: Diagnosis not present

## 2014-03-27 ENCOUNTER — Telehealth: Payer: Self-pay

## 2014-03-27 NOTE — Telephone Encounter (Signed)
AWV schedule for 04/02/14 9am.   Appt has to be rescheduled, NP will not be in that day.

## 2014-03-30 ENCOUNTER — Ambulatory Visit: Payer: Medicare Other | Admitting: Physical Therapy

## 2014-03-30 DIAGNOSIS — IMO0001 Reserved for inherently not codable concepts without codable children: Secondary | ICD-10-CM | POA: Diagnosis not present

## 2014-03-31 ENCOUNTER — Encounter: Payer: Medicare Other | Admitting: Physical Therapy

## 2014-04-02 ENCOUNTER — Telehealth: Payer: Self-pay | Admitting: Internal Medicine

## 2014-04-02 NOTE — Telephone Encounter (Signed)
Pt came by to attend a scheduled visit with NP. Pt stated she did not get any message about canceling the appt. Please review notes and call pt back to reschedule.

## 2014-04-03 ENCOUNTER — Ambulatory Visit: Payer: Medicare Other | Admitting: Physical Therapy

## 2014-04-03 DIAGNOSIS — IMO0001 Reserved for inherently not codable concepts without codable children: Secondary | ICD-10-CM | POA: Diagnosis not present

## 2014-04-06 ENCOUNTER — Ambulatory Visit: Payer: Medicare Other | Admitting: Physical Therapy

## 2014-04-07 NOTE — Telephone Encounter (Signed)
Appt scheduled

## 2014-04-09 ENCOUNTER — Ambulatory Visit: Payer: Medicare Other

## 2014-04-09 DIAGNOSIS — IMO0001 Reserved for inherently not codable concepts without codable children: Secondary | ICD-10-CM | POA: Diagnosis not present

## 2014-04-14 ENCOUNTER — Ambulatory Visit: Payer: Medicare Other | Attending: Orthopedic Surgery | Admitting: Physical Therapy

## 2014-04-14 DIAGNOSIS — M25619 Stiffness of unspecified shoulder, not elsewhere classified: Secondary | ICD-10-CM | POA: Insufficient documentation

## 2014-04-14 DIAGNOSIS — M25519 Pain in unspecified shoulder: Secondary | ICD-10-CM | POA: Diagnosis not present

## 2014-04-14 DIAGNOSIS — R5381 Other malaise: Secondary | ICD-10-CM | POA: Diagnosis not present

## 2014-04-14 DIAGNOSIS — IMO0001 Reserved for inherently not codable concepts without codable children: Secondary | ICD-10-CM | POA: Diagnosis not present

## 2014-04-16 ENCOUNTER — Ambulatory Visit (INDEPENDENT_AMBULATORY_CARE_PROVIDER_SITE_OTHER): Payer: Medicare Other | Admitting: Nurse Practitioner

## 2014-04-16 ENCOUNTER — Ambulatory Visit (INDEPENDENT_AMBULATORY_CARE_PROVIDER_SITE_OTHER)
Admission: RE | Admit: 2014-04-16 | Discharge: 2014-04-16 | Disposition: A | Discharge status: Home / Self Care | Payer: Medicare Other | Source: Ambulatory Visit | Attending: Internal Medicine | Admitting: Internal Medicine

## 2014-04-16 ENCOUNTER — Encounter: Payer: Self-pay | Admitting: Nurse Practitioner

## 2014-04-16 VITALS — BP 142/84 | HR 64 | Temp 98.3°F | Ht 64.0 in | Wt 241.5 lb

## 2014-04-16 DIAGNOSIS — Z13820 Encounter for screening for osteoporosis: Secondary | ICD-10-CM

## 2014-04-16 DIAGNOSIS — Z0000 Encounter for general adult medical examination without abnormal findings: Secondary | ICD-10-CM

## 2014-04-16 DIAGNOSIS — IMO0002 Reserved for concepts with insufficient information to code with codable children: Secondary | ICD-10-CM

## 2014-04-16 DIAGNOSIS — E118 Type 2 diabetes mellitus with unspecified complications: Secondary | ICD-10-CM

## 2014-04-16 DIAGNOSIS — E1165 Type 2 diabetes mellitus with hyperglycemia: Secondary | ICD-10-CM

## 2014-04-16 NOTE — Progress Notes (Signed)
Pre visit review using our clinic review tool, if applicable. No additional management support is needed unless otherwise documented below in the visit note. 

## 2014-04-16 NOTE — Patient Instructions (Addendum)
Get bone density scan Check HbA1c in October Get influenza vaccine in October Make follow up appointment with Dr. Felicity Coyer in 4 months Get TSH drawn in October Call clinic with questions or concerns Maintain low fat, low carbohydrate diet. Weight loss Bring in copy of living will for record Perform cologuard  Diabetes and Exercise Exercising regularly is important. It is not just about losing weight. It has many health benefits, such as:  Improving your overall fitness, flexibility, and endurance.  Increasing your bone density.  Helping with weight control.  Decreasing your body fat.  Increasing your muscle strength.  Reducing stress and tension.  Improving your overall health. People with diabetes who exercise gain additional benefits because exercise:  Reduces appetite.  Improves the body's use of blood sugar (glucose).  Helps lower or control blood glucose.  Decreases blood pressure.  Helps control blood lipids (such as cholesterol and triglycerides).  Improves the body's use of the hormone insulin by:  Increasing the body's insulin sensitivity.  Reducing the body's insulin needs.  Decreases the risk for heart disease because exercising:  Lowers cholesterol and triglycerides levels.  Increases the levels of good cholesterol (such as high-density lipoproteins [HDL]) in the body.  Lowers blood glucose levels. YOUR ACTIVITY PLAN  Choose an activity that you enjoy and set realistic goals. Your health care provider or diabetes educator can help you make an activity plan that works for you. Exercise regularly as directed by your health care provider. This includes:  Performing resistance training twice a week such as push-ups, sit-ups, lifting weights, or using resistance bands.  Performing 150 minutes of cardio exercises each week such as walking, running, or playing sports.  Staying active and spending no more than 90 minutes at one time being inactive. Even  short bursts of exercise are good for you. Three 10-minute sessions spread throughout the day are just as beneficial as a single 30-minute session. Some exercise ideas include:  Taking the dog for a walk.  Taking the stairs instead of the elevator.  Dancing to your favorite song.  Doing an exercise video.  Doing your favorite exercise with a friend. RECOMMENDATIONS FOR EXERCISING WITH TYPE 1 OR TYPE 2 DIABETES   Check your blood glucose before exercising. If blood glucose levels are greater than 240 mg/dL, check for urine ketones. Do not exercise if ketones are present.  Avoid injecting insulin into areas of the body that are going to be exercised. For example, avoid injecting insulin into:  The arms when playing tennis.  The legs when jogging.  Keep a record of:  Food intake before and after you exercise.  Expected peak times of insulin action.  Blood glucose levels before and after you exercise.  The type and amount of exercise you have done.  Review your records with your health care provider. Your health care provider will help you to develop guidelines for adjusting food intake and insulin amounts before and after exercising.  If you take insulin or oral hypoglycemic agents, watch for signs and symptoms of hypoglycemia. They include:  Dizziness.  Shaking.  Sweating.  Chills.  Confusion.  Drink plenty of water while you exercise to prevent dehydration or heat stroke. Body water is lost during exercise and must be replaced.  Talk to your health care provider before starting an exercise program to make sure it is safe for you. Remember, almost any type of activity is better than none. Document Released: 10/21/2003 Document Revised: 12/15/2013 Document Reviewed: 01/07/2013 [employer] Patient Information  2015 [employer]. This information is not intended to replace advice given to you by your health care provider. Make sure you discuss any questions you have with  your health care provider. Advance Directive Advance directives are the legal documents that allow you to make choices about your health care and medical treatment if you cannot speak for yourself. Advance directives are a way for you to communicate your wishes to family, friends, and health care providers. The specified people can then convey your decisions about end-of-life care to avoid confusion if you should become unable to communicate. Ideally, the process of discussing and writing advance directives should happen over time rather than making decisions all at once. Advance directives can be modified as your situation changes, and you can change your mind at any time, even after you have signed the advance directives. Each state has its own laws regarding advance directives. You may want to check with your health care provider, attorney, or state representative about the law in your state. Below are some examples of advance directives. LIVING WILL A living will is a set of instructions documenting your wishes about medical care when you cannot care for yourself. It is used if you become:  Terminally ill.  Incapacitated.  Unable to communicate.  Unable to make decisions. Items to consider in your living will include:  The use or non-use of life-sustaining equipment, such as dialysis machines and breathing machines (ventilators).  A do not resuscitate (DNR) order, which is the instruction not to use cardiopulmonary resuscitation (CPR) if breathing or heartbeat stops.  Tube feeding.  Withholding of food and fluids.  Comfort (palliative) care when the goal becomes comfort rather than a cure.  Organ and tissue donation. A living will does not give instructions about distribution of your money and property if you should pass away. It is advisable to seek the expert advice of a lawyer in drawing up a will regarding your possessions. Decisions about taxes, beneficiaries, and asset  distribution will be legally binding. This process can relieve your family and friends of any burdens surrounding disputes or questions that may come up about the allocation of your assets. DO NOT RESUSCITATE (DNR) A do not resuscitate (DNR) order is a request to not have CPR in the event that your heart stops beating or you stop breathing. Unless given other instructions, a health care provider will try to help any patient whose heart has stopped or who has stopped breathing.  HEALTH CARE PROXY AND DURABLE POWER OF ATTORNEY FOR HEALTH CARE A health care proxy is a person (agent) appointed to make medical decisions for you if you cannot. Generally, people choose someone they know well and trust to represent their preferences when they can no longer do so. You should be sure to ask this person for agreement to act as your agent. An agent may have to exercise judgment in the event of a medical decision for which your wishes are not known. The durable power of attorney for health care is the legal document that names your health care proxy. Once written, it should be:  Signed.  Notarized.  Dated.  Copied.  Witnessed.  Incorporated into your medical record. You may also want to appoint someone to manage your financial affairs if you cannot. This is called a durable power of attorney for finances. It is a separate legal document from the durable power of attorney for health care. You may choose the same person or someone different from your health  care proxy to act as your agent in financial matters. Document Released: 11/07/2007 Document Revised: 08/05/2013 Document Reviewed: 12/18/2012 [facility] Patient Information 2015 [place], [place]. This information is not intended to replace advice given to you by your health care provider. Make sure you discuss any questions you have with your health care provider.

## 2014-04-16 NOTE — Progress Notes (Addendum)
Subjective:    Jamie Aguirre is a 68 y.o. female who presents for Medicare Initial preventive examination.  Preventive Screening-Counseling & Management  Tobacco History  Smoking status  . Former Smoker -- 3.50 packs/day for 20 years  . Types: Cigarettes  . Quit date: 08/15/1975  Smokeless tobacco  . Never Used     Problems Prior to Visit 1.   Current Problems (verified) Patient Active Problem List   Diagnosis Date Noted  . Arthritis of right ankle 07/08/2013  . Retrocalcaneal bursitis 07/08/2013  . Chronic pain of right ankle   . Depressive disorder 10/09/2012  . Vertigo   . OSA (obstructive sleep apnea)   . Obese 01/01/2012  . Type 2 diabetes mellitus   . Hypertension   . Hyperlipidemia   . Stroke   . Allergic rhinitis, seasonal   . Chronic bronchitis     Medications Prior to Visit Current Outpatient Prescriptions on File Prior to Visit  Medication Sig Dispense Refill  . albuterol (VENTOLIN HFA) 108 (90 BASE) MCG/ACT inhaler Inhale 2 puffs into the lungs every 6 (six) hours as needed.      Marland Kitchen amitriptyline (ELAVIL) 10 MG tablet Take 1 tablet (10 mg total) by mouth at bedtime.  30 tablet  3  . aspirin 81 MG tablet Take 40.5 mg by mouth every other day.       Marland Kitchen atorvastatin (LIPITOR) 20 MG tablet TAKE 1 TABLET BY MOUTH EVERY DAY  90 tablet  3  . benzonatate (TESSALON) 200 MG capsule TAKE ONE CAPSULE BY MOUTH 3 TIMES A DAY AS NEEDED FOR COUGH  30 capsule  1  . Blood Glucose Calibration (NOVA MAX CONTROL) NORMAL LIQD 1 each by In Vitro route as needed. Dx code 250.00  1 each  0  . clopidogrel (PLAVIX) 75 MG tablet TAKE 1 TABLET BY MOUTH ONCE DAILY  30 tablet  5  . [facility] LANCETS THIN 26G MISC USE AS DIRECTED 4 TIMES DAILY  100 each  1  . diazepam (VALIUM) 5 MG tablet Take 0.5-1 tablets (2.5-5 mg total) by mouth every 12 (twelve) hours as needed (vertigo symptoms).  20 tablet  0  . diphenhydrAMINE (BENADRYL) 25 MG tablet Take 25 mg by mouth at bedtime.      Marland Kitchen glucose blood  (NOVA MAX TEST) test strip Dx: 250.00  180 each  3  . glucose blood test strip Use as instructed four times daily, with Lancets. Dx code 250.00  100 each  12  . Guaifenesin 1200 MG TB12 Take 1 tablet (1,200 mg total) by mouth 2 (two) times daily.  60 each  0  . Linagliptin-Metformin HCl (JENTADUETO) 2.12-998 MG TABS Take 1 tablet by mouth 2 (two) times daily.  60 tablet  11  . loratadine (CLARITIN) 10 MG tablet Take 10 mg by mouth every morning.      Marland Kitchen losartan (COZAAR) 100 MG tablet Take 100 mg by mouth every morning.      . metoprolol succinate (TOPROL-XL) 100 MG 24 hr tablet TAKE 1 TABLET BY MOUTH ONCE DAILY  90 tablet  1  . cyclobenzaprine (FLEXERIL) 5 MG tablet Take 1 tablet (5 mg total) by mouth every 8 (eight) hours as needed for muscle spasms.  30 tablet  1   No current facility-administered medications on file prior to visit.    Current Medications (verified) Current Outpatient Prescriptions  Medication Sig Dispense Refill  . albuterol (VENTOLIN HFA) 108 (90 BASE) MCG/ACT inhaler Inhale 2 puffs into the lungs  every 6 (six) hours as needed.      Marland Kitchen amitriptyline (ELAVIL) 10 MG tablet Take 1 tablet (10 mg total) by mouth at bedtime.  30 tablet  3  . aspirin 81 MG tablet Take 40.5 mg by mouth every other day.       Marland Kitchen atorvastatin (LIPITOR) 20 MG tablet TAKE 1 TABLET BY MOUTH EVERY DAY  90 tablet  3  . benzonatate (TESSALON) 200 MG capsule TAKE ONE CAPSULE BY MOUTH 3 TIMES A DAY AS NEEDED FOR COUGH  30 capsule  1  . Blood Glucose Calibration (NOVA MAX CONTROL) NORMAL LIQD 1 each by In Vitro route as needed. Dx code 250.00  1 each  0  . clopidogrel (PLAVIX) 75 MG tablet TAKE 1 TABLET BY MOUTH ONCE DAILY  30 tablet  5  . [facility] LANCETS THIN 26G MISC USE AS DIRECTED 4 TIMES DAILY  100 each  1  . diazepam (VALIUM) 5 MG tablet Take 0.5-1 tablets (2.5-5 mg total) by mouth every 12 (twelve) hours as needed (vertigo symptoms).  20 tablet  0  . diphenhydrAMINE (BENADRYL) 25 MG tablet Take 25 mg by  mouth at bedtime.      Marland Kitchen glucose blood (NOVA MAX TEST) test strip Dx: 250.00  180 each  3  . glucose blood test strip Use as instructed four times daily, with Lancets. Dx code 250.00  100 each  12  . Guaifenesin 1200 MG TB12 Take 1 tablet (1,200 mg total) by mouth 2 (two) times daily.  60 each  0  . Linagliptin-Metformin HCl (JENTADUETO) 2.12-998 MG TABS Take 1 tablet by mouth 2 (two) times daily.  60 tablet  11  . loratadine (CLARITIN) 10 MG tablet Take 10 mg by mouth every morning.      Marland Kitchen losartan (COZAAR) 100 MG tablet Take 100 mg by mouth every morning.      . metoprolol succinate (TOPROL-XL) 100 MG 24 hr tablet TAKE 1 TABLET BY MOUTH ONCE DAILY  90 tablet  1  . [facility] LANCETS THIN 26G MISC       . cyclobenzaprine (FLEXERIL) 5 MG tablet Take 1 tablet (5 mg total) by mouth every 8 (eight) hours as needed for muscle spasms.  30 tablet  1  . triamcinolone cream (KENALOG) 0.1 %        No current facility-administered medications for this visit.     Allergies (verified) Ciprofloxacin; Codeine; Penicillins; and Sulfa antibiotics   PAST HISTORY  Family History Family History  Problem Relation Age of Onset  . Arthritis Other   . Stroke Other   . Hypertension Other   . Hyperlipidemia Other   . Diabetes Other   . Heart disease Father     Social History History  Substance Use Topics  . Smoking status: Former Smoker -- 3.50 packs/day for 20 years    Types: Cigarettes    Quit date: 08/15/1975  . Smokeless tobacco: Never Used  . Alcohol Use: No     Are there smokers in your home (other than you)? No  Risk Factors Current exercise habits: Gym/ health club routine includes swimming.  Dietary issues discussed: eats a well balanced diet   Cardiac risk factors: advanced age (older than 25 for men, 69 for women), diabetes mellitus, dyslipidemia, hypertension and obesity (BMI >= 30 kg/m2).  Depression Screen (Note: if answer to either of the following is "Yes", a more complete  depression screening is indicated)   Over the past 2 weeks, have you  felt down, depressed or hopeless? No  Over the past 2 weeks, have you felt little interest or pleasure in doing things? No  Have you lost interest or pleasure in daily life? No  Do you often feel hopeless? No  Do you cry easily over simple problems? No  Activities of Daily Living In your present state of health, do you have any difficulty performing the following activities?:  Driving? No Managing money?  No Feeding yourself? No Getting from bed to chair? NoBP 142/84  Pulse 64  Temp(Src) 98.3 F (36.8 C) (Oral)  Ht  (1.626 m)  Wt 241 lb 8 oz (109.544 kg)  BMI 41.43 kg/m2  SpO2 98%  General Appearance:    Alert, cooperative, no distress, appears stated age  Head:    Normocephalic, without obvious abnormality, atraumatic  Eyes:    PERRL, conjunctiva/corneas clear, EOM's intact,       Ears:    Normal TM or left, Right canal normal  Nose:   Mild nasal congestion bilaterally     Throat:   Lips, mucosa, and tongue normal; teeth and gums normal  Neck:   Supple, symmetrical, trachea midline, no adenopathy;    thyroid:  no enlargement/tenderness/nodules; no carotid   bruit   Back:      no CVA tenderness  Lungs:     Clear to auscultation bilaterally, respirations unlabored  Chest Wall:    No tenderness or deformity   Heart:    Regular rate and rhythm, S1 and S2 normal, no murmur, rub   or gallop  Breast Exam:    Not performed  Abdomen:     Soft, non-tender, bowel sounds active all four quadrants,    no masses, no organomegaly  Genitalia:    Not perofrmed  Rectal:    Not performed  Patient signed for cologuard test to be performed;     Extremities:   Extremities normal, atraumatic, no cyanosis or edema  Pulses:   Not checked  Skin:   Skin color, texture, turgor normal, no rashes or lesions  Lymph nodes:   Cervical, supraclavicularnodes normal  Neurologic:   Alert and orientated   t   Climbing a flight of  stairs? Yes Preparing food and eating?: No Bathing or showering? No Getting dressed: No Getting to the toilet? No Using the toilet:No Moving around from place to place: No In the past year have you fallen or had a near fall?:No   Are you sexually active?  No  Do you have more than one partner?  No  Hearing Difficulties: Patient wears bilateral hearing aids. Hereditary hearing loss at age 63 Do you often ask people to speak up or repeat themselves? Yes Do you experience ringing or noises in your ears? Yes Do you have difficulty understanding soft or whispered voices? Yes   Do you feel that you have a problem with memory? Yes  Do you often misplace items? No  Do you feel safe at home?  Yes  Cognitive Testing  Alert? Yes  Normal Appearance?Yes  Oriented to person? Yes  Place? Yes   Time? Yes  Recall of three objects?  Yes  Can perform simple calculations? Yes  Displays appropriate judgment?Yes  Can read the correct time from a watch face?Yes   Advanced Directives have been discussed with the patient? Yes  List the Names of Other Physician/Practitioners you currently use: 1.  Patient Care Team: Newt Lukes, MD as PCP - General (Internal Medicine) Lunette Stands, MD (Orthopedic  Surgery) Carolan Shiver, MD Barbaraann Share, MD (Pulmonary Disease) Holli Humbles, MD (Ophthalmology) Judi Saa, DO (Sports Medicine)   Indicate any recent Medical Services you may have received from other than [facility] providers in the past year (date may be approximate).  Immunization History  Administered Date(s) Administered  . Influenza Split 05/14/2012  . Influenza, High Dose Seasonal PF 05/28/2013  . Pneumococcal Conjugate-13 05/14/2004  . Pneumococcal Polysaccharide-23 01/01/2012  . Tetanus 11/20/2012  . Zoster 11/20/2012    Screening Tests Health Maintenance  Topic Date Due  . Foot Exam  06/09/1957  . Mammogram  01/22/2014  . Urine Microalbumin  02/20/2014  . Influenza  Vaccine  03/14/2014  . Colonoscopy  01/01/2015 (Originally 06/09/1997)  . Hemoglobin A1c  08/20/2014  . Ophthalmology Exam  10/06/2014  . Tetanus/tdap  11/21/2022  . Pneumococcal Polysaccharide Vaccine Age 24 And Over  Completed  . Zostavax  Completed    All answers were reviewed with the patient and necessary referrals were made:  Jamie Havers, NP   04/16/2014   History reviewed: allergies, current medications, past family history, past medical history, past social history, past surgical history and problem list  Review of Systems Allergic/Immunologic: negative except for urticaria, see allergies see listed allergies    Objective:     Vision by Snellen chart: right [id number] has had 2 eye surgeries, lens, wears glasses for activities of daily living, left eye:see previous  Body mass index is 41.43 kg/(m^2). BP 142/84  Pulse 64  Temp(Src) 98.3 F (36.8 C) (Oral)  Ht 5\' 4"  (1.626 m)  Wt 241 lb 8 oz (109.544 kg)  BMI 41.43 kg/m2  SpO2 98%  BP 142/84  Pulse 64  Temp(Src) 98.3 F (36.8 C) (Oral)  Ht 5\' 4"  (1.626 m)  Wt 241 lb 8 oz (109.544 kg)  BMI 41.43 kg/m2  SpO2 98%  General Appearance:  See above.     Head:    Eyes:    Ears:    Nose:   Throat:   Neck:   Back:     Lungs:      Chest Wall:     Heart:    Breast Exam:    Abdomen:     Genitalia:    Rectal:    Extremities:   Pulses:   Skin:   Lymph nodes:   Neurologic:     ECG performed Sinus bradycardia   Assessment:     Patient presents for yearly preventative medicine examination. Medicare questionnaire was completed  All immunizations and health maintenance protocols were reviewed with the patient and needed orders were placed.  Appropriate screening laboratory values were ordered for the patient including screening of hyperlipidemia, renal function and hepatic function. If indicated by BPH, a PSA was ordered.  Medication reconciliation,  past medical history, social history, problem  list and allergies were reviewed in detail with the patient  Goals were established with regard to weight loss, exercise, and  diet in compliance with medications  End of life planning was discussed.       Plan:     During the course of the visit the patient was educated and counseled about appropriate screening and preventive services including:    Influenza vaccine  Diet review for nutrition referral? Yes ____  Not Indicated __x__Patient declines   Patient Instructions (the written plan) was given to the patient.  Medicare Attestation I have personally reviewed: The patient's medical and social history Their use of alcohol, tobacco or illicit  drugs Their current medications and supplements The patient's functional ability including ADLs,fall risks, home safety risks, cognitive, and hearing and visual impairment Diet and physical activities Evidence for depression or mood disorders  The patient's weight, height, BMI, and visual acuity have been recorded in the chart.  I have made referrals, counseling, and provided education to the patient based on review of the above and I have provided the patient with a written personalized care plan for preventive services.    Discussed with patient to lose weight, low fat, carbohydrate diet.  Continue regular exercise.  Advanced directives given  Order for mammogram dexa scan bone density Cologuard paper signed by patient and will be faxed to appropriate facility Patient declines influenza vaccine. Will wait until October to receive vaccine Will check hba1c, and TSH at that time Follow up with Dr. Felicity Coyer in 4 months for BP evauation  Follow up with Endocrinologist  Dr. Felicie Morn ECG perfomed in office today. Reviewed and evaluated by Dr. Alwyn Ren Sinus Bradycardia,no ischemia STt-T changes, Left Axis Deviation, poor R wave progression V1-4. Will scan into patient medical record.  Call clinic with questions or concerns Patient  states Dr. Lunette Stands MD Orthopedic Specialist requests patient get a bone density as patient with multiple joint involvement osteoarthritis including knees and( bone spurs in spine per patient) and is unable to perform weight bearing exercises.      Purcell Mouton A, NP   04/16/2014

## 2014-04-24 ENCOUNTER — Ambulatory Visit: Payer: Medicare Other | Admitting: Physical Therapy

## 2014-04-24 DIAGNOSIS — IMO0001 Reserved for inherently not codable concepts without codable children: Secondary | ICD-10-CM | POA: Diagnosis not present

## 2014-04-27 ENCOUNTER — Ambulatory Visit: Payer: Medicare Other | Admitting: Physical Therapy

## 2014-04-27 DIAGNOSIS — IMO0001 Reserved for inherently not codable concepts without codable children: Secondary | ICD-10-CM | POA: Diagnosis not present

## 2014-04-30 ENCOUNTER — Ambulatory Visit: Payer: Medicare Other

## 2014-05-14 ENCOUNTER — Encounter: Payer: Self-pay | Admitting: Internal Medicine

## 2014-05-14 ENCOUNTER — Ambulatory Visit (INDEPENDENT_AMBULATORY_CARE_PROVIDER_SITE_OTHER): Payer: Medicare Other | Admitting: Internal Medicine

## 2014-05-14 ENCOUNTER — Other Ambulatory Visit: Payer: Medicare Other

## 2014-05-14 VITALS — BP 162/82 | HR 72 | Temp 98.2°F | Resp 16 | Ht 64.0 in | Wt 239.4 lb

## 2014-05-14 DIAGNOSIS — K529 Noninfective gastroenteritis and colitis, unspecified: Secondary | ICD-10-CM

## 2014-05-14 DIAGNOSIS — R197 Diarrhea, unspecified: Secondary | ICD-10-CM

## 2014-05-14 NOTE — Patient Instructions (Addendum)
We want you to try to increase the amount of fiber in your diet with metamucil or benefiber to help bulk up your stools. If you have problems with gas you can try gas-x (also called simethicone) to help with that. You can also use the imodium as needed for the diarrhea. When you have the first episode take 1 imodium. If you have more diarrhea then after the 3rd episode take another imodium.   You can take the imodium up to 4 pills in 24 hours. We would like you to do the colon cancer screening test at home if you are unable to do the colonoscopy.  We will check you for celiac disease today and that may take about 1 week for those results. If we still do not have results we may need to do further testing.   Diarrhea Diarrhea is frequent loose and watery bowel movements. It can cause you to feel weak and dehydrated. Dehydration can cause you to become tired and thirsty, have a dry mouth, and have decreased urination that often is dark yellow. Diarrhea is a sign of another problem, most often an infection that will not last long. In most cases, diarrhea typically lasts 2-3 days. However, it can last longer if it is a sign of something more serious. It is important to treat your diarrhea as directed by your caregiver to lessen or prevent future episodes of diarrhea. CAUSES  Some common causes include:  Gastrointestinal infections caused by viruses, bacteria, or parasites.  Food poisoning or food allergies.  Certain medicines, such as antibiotics, chemotherapy, and laxatives.  Artificial sweeteners and fructose.  Digestive disorders. HOME CARE INSTRUCTIONS  Ensure adequate fluid intake (hydration): Have 1 cup (8 oz) of fluid for each diarrhea episode. Avoid fluids that contain simple sugars or sports drinks, fruit juices, whole milk products, and sodas. Your urine should be clear or pale yellow if you are drinking enough fluids. Hydrate with an oral rehydration solution that you can purchase at  pharmacies, retail stores, and online. You can prepare an oral rehydration solution at home by mixing the following ingredients together:   - tsp table salt.   tsp baking soda.   tsp salt substitute containing potassium chloride.  1  tablespoons sugar.  1 L (34 oz) of water.  Certain foods and beverages may increase the speed at which food moves through the gastrointestinal (GI) tract. These foods and beverages should be avoided and include:  Caffeinated and alcoholic beverages.  High-fiber foods, such as raw fruits and vegetables, nuts, seeds, and whole grain breads and cereals.  Foods and beverages sweetened with sugar alcohols, such as xylitol, sorbitol, and mannitol.  Some foods may be well tolerated and may help thicken stool including:  Starchy foods, such as rice, toast, pasta, low-sugar cereal, oatmeal, grits, baked potatoes, crackers, and bagels.  Bananas.  Applesauce.  Add probiotic-rich foods to help increase healthy bacteria in the GI tract, such as yogurt and fermented milk products.  Wash your hands well after each diarrhea episode.  Only take over-the-counter or prescription medicines as directed by your caregiver.  Take a warm bath to relieve any burning or pain from frequent diarrhea episodes. SEEK IMMEDIATE MEDICAL CARE IF:   You are unable to keep fluids down.  You have persistent vomiting.  You have blood in your stool, or your stools are black and tarry.  You do not urinate in 6-8 hours, or there is only a small amount of very dark urine.  You  have abdominal pain that increases or localizes.  You have weakness, dizziness, confusion, or light-headedness.  You have a severe headache.  Your diarrhea gets worse or does not get better.  You have a fever or persistent symptoms for more than 2-3 days.  You have a fever and your symptoms suddenly get worse. MAKE SURE YOU:   Understand these instructions.  Will watch your condition.  Will get  help right away if you are not doing well or get worse. Document Released: 07/21/2002 Document Revised: 12/15/2013 Document Reviewed: 04/07/2012 [facility] Patient Information 2015 [place], [place]. This information is not intended to replace advice given to you by your health care provider. Make sure you discuss any questions you have with your health care provider.

## 2014-05-14 NOTE — Progress Notes (Signed)
Pre visit review using our clinic review tool, if applicable. No additional management support is needed unless otherwise documented below in the visit note. 

## 2014-05-15 ENCOUNTER — Telehealth: Payer: Self-pay | Admitting: Internal Medicine

## 2014-05-15 DIAGNOSIS — K529 Noninfective gastroenteritis and colitis, unspecified: Secondary | ICD-10-CM | POA: Insufficient documentation

## 2014-05-15 LAB — RETICULIN ANTIBODIES, IGA W TITER: Reticulin Ab, IgA: NEGATIVE

## 2014-05-15 LAB — GLIADIN ANTIBODIES, SERUM
Gliadin IgA: 6.7 U/mL (ref <20)
Gliadin IgG: 8.3 U/mL (ref <20)

## 2014-05-15 LAB — TISSUE TRANSGLUTAMINASE, IGA: Tissue Transglutaminase Ab, IgA: 7.2 U/mL (ref <20)

## 2014-05-15 NOTE — Telephone Encounter (Signed)
Patient states she has still not had a bowel movement for four days.  Should she take a laxative?  She is confused what to do if she takes a laxative and then has diarrhea.

## 2014-05-15 NOTE — Assessment & Plan Note (Signed)
Per patient she is not able to give sample, no description matching fat in stool or blood. Not likely to be bacterial or viral. No recent antibiotics making C dif less likely. Could be IBS but given no colonoscopy would definitely recommend. Advised her to get sample for the home colon screening test (advised her that this would not tell cause but may reassure us that this is less likely to be colon cancer). Checking celiac panel today although does not sound typical. No weight loss.

## 2014-05-15 NOTE — Telephone Encounter (Signed)
Per Dr. Dorise HissKollar, take miralax. If patient has 2 episodes of diarrhea, stop miralax and take immodium. Patient aware.

## 2014-05-15 NOTE — Progress Notes (Signed)
   Subjective:    Patient ID: Jamie KaufmannLinda Aguirre, female    DOB: [birth year], 68 y.o.   MRN: [id number]  HPI The patient is a 68 YO female who is coming in today for an acute visit for ongoing diarrhea. She has had it over months and it is now consistent. She will have several watery bowel movements a day then takes imodium after the 5th or 6th one then she has some constipation for a day or so. This is a recurring cycle. She has never had colonoscopy and has been trying to produce a sample for a home test but she has been having these episodes since then and is not able to not urinate and to catch the sample. She denies gross blood. She states she is unable to do colonoscopy as she feels faint after 2-3 diarrhea episodes and would need to be hospitalized if she had to do the prep (she lives alone). No fevers, chills. No weight loss over the last several months. Since this started she has not been eating much fiber at all.   Review of Systems  Constitutional: Negative for diaphoresis, activity change, appetite change and fatigue.  Respiratory: Negative for chest tightness and shortness of breath.   Cardiovascular: Negative for chest pain, palpitations and leg swelling.  Gastrointestinal: Positive for diarrhea, constipation and abdominal distention. Negative for nausea, vomiting, abdominal pain, blood in stool, anal bleeding and rectal pain.  Neurological: Negative for dizziness and headaches.      Objective:   Physical Exam  Constitutional: She appears well-developed and well-nourished. No distress.  HENT:  Head: Normocephalic and atraumatic.  Cardiovascular: Normal rate and regular rhythm.   Pulmonary/Chest: Effort normal and breath sounds normal.  Abdominal: Soft. Bowel sounds are normal. She exhibits no distension. There is no tenderness. There is no rebound.   Filed Vitals:   05/14/14 1314  BP: 162/82  Pulse: 72  Temp: 98.2 F (36.8 C)  TempSrc: Oral  Resp: 16  Height: 5\' 4"  (1.626 m)    Weight: 239 lb 6.4 oz (108.591 kg)  SpO2: 92%      Assessment & Plan:

## 2014-05-19 LAB — COLOGUARD: Cologuard: NEGATIVE

## 2014-05-20 ENCOUNTER — Ambulatory Visit (INDEPENDENT_AMBULATORY_CARE_PROVIDER_SITE_OTHER): Payer: Medicare Other | Admitting: *Deleted

## 2014-05-20 ENCOUNTER — Other Ambulatory Visit (INDEPENDENT_AMBULATORY_CARE_PROVIDER_SITE_OTHER): Payer: Medicare Other

## 2014-05-20 ENCOUNTER — Ambulatory Visit (HOSPITAL_COMMUNITY)
Admission: RE | Admit: 2014-05-20 | Discharge: 2014-05-20 | Disposition: A | Discharge status: Home / Self Care | Payer: Medicare Other | Source: Ambulatory Visit | Attending: Nurse Practitioner | Admitting: Nurse Practitioner

## 2014-05-20 ENCOUNTER — Other Ambulatory Visit: Payer: Self-pay | Admitting: Nurse Practitioner

## 2014-05-20 DIAGNOSIS — Z1231 Encounter for screening mammogram for malignant neoplasm of breast: Secondary | ICD-10-CM | POA: Insufficient documentation

## 2014-05-20 DIAGNOSIS — Z139 Encounter for screening, unspecified: Secondary | ICD-10-CM

## 2014-05-20 DIAGNOSIS — Z0000 Encounter for general adult medical examination without abnormal findings: Secondary | ICD-10-CM

## 2014-05-20 DIAGNOSIS — E1165 Type 2 diabetes mellitus with hyperglycemia: Secondary | ICD-10-CM

## 2014-05-20 DIAGNOSIS — Z23 Encounter for immunization: Secondary | ICD-10-CM

## 2014-05-20 DIAGNOSIS — E118 Type 2 diabetes mellitus with unspecified complications: Secondary | ICD-10-CM

## 2014-05-20 DIAGNOSIS — IMO0002 Reserved for concepts with insufficient information to code with codable children: Secondary | ICD-10-CM

## 2014-05-20 LAB — HEMOGLOBIN A1C: HEMOGLOBIN A1C: 7.3 % — AB (ref 4.6–6.5)

## 2014-05-20 LAB — TSH: TSH: 4.95 u[IU]/mL — ABNORMAL HIGH (ref 0.35–4.50)

## 2014-05-22 ENCOUNTER — Other Ambulatory Visit: Payer: Self-pay | Admitting: Nurse Practitioner

## 2014-05-22 DIAGNOSIS — R928 Other abnormal and inconclusive findings on diagnostic imaging of breast: Secondary | ICD-10-CM

## 2014-05-25 ENCOUNTER — Telehealth: Payer: Self-pay | Admitting: Internal Medicine

## 2014-05-25 NOTE — Telephone Encounter (Signed)
Pt request lab result that was done 05/20/14. Please call pt

## 2014-05-26 NOTE — Telephone Encounter (Signed)
We discussed a plan with imodium at the visit and she should continue using that. If she is still having problems it may be wise for her to go see a GI doctor.

## 2014-05-26 NOTE — Telephone Encounter (Signed)
Spoke with patient and talked to her about her lab results. She is still having diarrhea. We are waiting on the cologuard results. She wants to know if there is anything she should be doing for the diarrhea until we get the results back? Please advise, thanks.

## 2014-05-27 LAB — FECAL OCCULT BLOOD, GUAIAC: FECAL OCCULT BLD: NEGATIVE

## 2014-05-27 NOTE — Telephone Encounter (Signed)
Tried to call patient. No answer and no voice mail 

## 2014-05-28 NOTE — Telephone Encounter (Signed)
Tried to reach patient, no answer and no voice mail  

## 2014-06-01 ENCOUNTER — Ambulatory Visit
Admission: RE | Admit: 2014-06-01 | Discharge: 2014-06-01 | Disposition: A | Discharge status: Home / Self Care | Payer: Medicare Other | Source: Ambulatory Visit | Attending: Nurse Practitioner | Admitting: Nurse Practitioner

## 2014-06-01 ENCOUNTER — Other Ambulatory Visit: Payer: Self-pay | Admitting: Nurse Practitioner

## 2014-06-01 DIAGNOSIS — R928 Other abnormal and inconclusive findings on diagnostic imaging of breast: Secondary | ICD-10-CM

## 2014-06-03 ENCOUNTER — Encounter: Payer: Self-pay | Admitting: Internal Medicine

## 2014-06-03 ENCOUNTER — Telehealth: Payer: Self-pay

## 2014-06-03 NOTE — Telephone Encounter (Signed)
Informed pt of negative FOBT results from [facility].   Results have also been abstracted.

## 2014-06-08 ENCOUNTER — Other Ambulatory Visit: Payer: Self-pay | Admitting: Nurse Practitioner

## 2014-06-08 ENCOUNTER — Other Ambulatory Visit: Payer: Self-pay

## 2014-06-08 ENCOUNTER — Encounter: Payer: Self-pay | Admitting: Internal Medicine

## 2014-06-08 DIAGNOSIS — R928 Other abnormal and inconclusive findings on diagnostic imaging of breast: Secondary | ICD-10-CM

## 2014-06-09 ENCOUNTER — Other Ambulatory Visit: Payer: Medicare Other

## 2014-06-11 ENCOUNTER — Telehealth: Payer: Self-pay | Admitting: Internal Medicine

## 2014-06-11 ENCOUNTER — Other Ambulatory Visit: Payer: Medicare Other

## 2014-06-11 ENCOUNTER — Inpatient Hospital Stay: Admission: RE | Admit: 2014-06-11 | Payer: Medicare Other | Source: Ambulatory Visit

## 2014-06-11 NOTE — Telephone Encounter (Signed)
Dr Alesia BandaNeil Lutins, Dentist is requesting a call to get more info on patient's Plavix. Pt is going to have an extraction. He has questions pertaining before/after/dosage.  Dr Alesia BandaNeil Lutins phone#[phone] Dr Lloyd HugerNeil Lutin's fax #[phone]

## 2014-06-11 NOTE — Telephone Encounter (Signed)
How many days pt need to stop plavix prior & when she need to start back taking...[name]/lmb

## 2014-06-12 ENCOUNTER — Inpatient Hospital Stay: Admission: RE | Admit: 2014-06-12 | Payer: Medicare Other | Source: Ambulatory Visit

## 2014-06-12 NOTE — Telephone Encounter (Signed)
Notified Dr. Corliss MarcusLutins office gave md response...[name]/lmb

## 2014-06-12 NOTE — Telephone Encounter (Signed)
Stop 5 days before then restart 1-2 days after or when the risk of bleeding is low (Dr. De HollingsheadLutin can make that call).

## 2014-06-15 ENCOUNTER — Telehealth: Payer: Self-pay | Admitting: Internal Medicine

## 2014-06-15 ENCOUNTER — Encounter: Payer: Self-pay | Admitting: Pulmonary Disease

## 2014-06-15 ENCOUNTER — Ambulatory Visit (INDEPENDENT_AMBULATORY_CARE_PROVIDER_SITE_OTHER): Payer: Medicare Other | Admitting: Pulmonary Disease

## 2014-06-15 VITALS — BP 128/62 | HR 68 | Temp 97.8°F | Ht 64.0 in | Wt 241.6 lb

## 2014-06-15 DIAGNOSIS — G4733 Obstructive sleep apnea (adult) (pediatric): Secondary | ICD-10-CM

## 2014-06-15 NOTE — Assessment & Plan Note (Signed)
The patient is very compliant with C Pap by her download, and has excellent control of her AHI with no significant mask leak. I have asked her to continue on her device, and to keep working on weight loss. She obviously has a lot of other issues which are contributing to her restless sleep. I would like to see her back in one year, but she is to call if having issues with her device.

## 2014-06-15 NOTE — Patient Instructions (Signed)
Continue on cpap, and keep up with mask changes and supplies. Work on weight loss the best you can followup with me again in one year, but call if having issues.

## 2014-06-15 NOTE — Progress Notes (Signed)
   Subjective:    Patient ID: Jamie KaufmannLinda Aguirre, female    DOB: 06/16/1946, 68 y.o.   MRN: [id number]  HPI The patient comes in today for follow-up of her known obstructive sleep apnea. She tells me that she is wearing  C Pap compliantly, and currently is having no issues with mask fit or pressure. She is still having restless sleep for many different reasons, with some daytime sleepiness. She did bring her chip in for download.     Review of Systems  Constitutional: Negative for fever and unexpected weight change.  HENT: Negative for congestion, dental problem, ear pain, nosebleeds, postnasal drip, rhinorrhea, sinus pressure, sneezing, sore throat and trouble swallowing.   Eyes: Negative for redness and itching.  Respiratory: Negative for cough, chest tightness, shortness of breath and wheezing.   Cardiovascular: Negative for palpitations and leg swelling.  Gastrointestinal: Negative for nausea and vomiting.  Genitourinary: Negative for dysuria.  Musculoskeletal: Negative for joint swelling.  Skin: Negative for rash.  Neurological: Negative for headaches.  Hematological: Does not bruise/bleed easily.  Psychiatric/Behavioral: Negative for dysphoric mood. The patient is not nervous/anxious.        Objective:   Physical Exam Obese female in no acute distress Nose without purulence or discharge noted Neck without lymphadenopathy or thyromegaly No skin breakdown or pressure necrosis from the C Pap mask Lower extremities with mild edema, no cyanosis Alert and oriented, moves all 4 extremities.       Assessment & Plan:

## 2014-06-15 NOTE — Telephone Encounter (Signed)
Patient would like to have a surgery consultation with Dr. Dorise HissKollar.  The soonest I seen was December for a OV.  She is requesting to be worked in sooner.

## 2014-06-15 NOTE — Telephone Encounter (Signed)
Are you able to call this patient to schedule a surgery clearance visit?

## 2014-06-24 ENCOUNTER — Other Ambulatory Visit: Payer: Self-pay | Admitting: Internal Medicine

## 2014-06-29 ENCOUNTER — Other Ambulatory Visit: Payer: Self-pay | Admitting: Geriatric Medicine

## 2014-06-29 MED ORDER — CLOPIDOGREL BISULFATE 75 MG PO TABS: ORAL_TABLET | ORAL | Status: DC

## 2014-06-30 ENCOUNTER — Encounter: Payer: Self-pay | Admitting: Internal Medicine

## 2014-06-30 ENCOUNTER — Ambulatory Visit (INDEPENDENT_AMBULATORY_CARE_PROVIDER_SITE_OTHER): Payer: Medicare Other | Admitting: Internal Medicine

## 2014-06-30 VITALS — BP 148/74 | HR 71 | Temp 97.5°F | Ht 64.0 in | Wt 242.5 lb

## 2014-06-30 DIAGNOSIS — Z7189 Other specified counseling: Secondary | ICD-10-CM

## 2014-06-30 NOTE — Patient Instructions (Addendum)
We do not think the need to stop the Plavix for oral surgery. We also do not think you need to stop Plavix when you have your breast biopsy.  We will send a note to your oral surgeon letting him know.  Come back as needed or for your physical next year.

## 2014-06-30 NOTE — Progress Notes (Signed)
Pre visit review using our clinic review tool, if applicable. No additional management support is needed unless otherwise documented below in the visit note. 

## 2014-07-01 ENCOUNTER — Other Ambulatory Visit: Payer: Self-pay | Admitting: Geriatric Medicine

## 2014-07-01 ENCOUNTER — Telehealth: Payer: Self-pay | Admitting: Internal Medicine

## 2014-07-01 DIAGNOSIS — Z7189 Other specified counseling: Secondary | ICD-10-CM | POA: Insufficient documentation

## 2014-07-01 MED ORDER — CLOPIDOGREL BISULFATE 75 MG PO TABS: ORAL_TABLET | ORAL | Status: DC

## 2014-07-01 NOTE — Progress Notes (Signed)
   Subjective:    Patient ID: Jamie KaufmannLinda Aguirre, female    DOB: April 15, 1946, 68 y.o.   MRN: [id number]  HPI The patient is a 68 year old female who comes in today for an acute visit for medication consult. She is to have some oral surgery done. I spoke with her oral surgeon on the phone yesterday and he thinks that the surgery can be accomplished without stopping Plavix. 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 She also needs to have a breast biopsy in the near future and wants to know if she would be recommended to stop her Plavix prior to that or whether the chance of bleeding would be low enough that she may be able to continue her Plavix. She denies any chest pains and is able to go up 1 flight of stairs.  Review of Systems  Constitutional: Negative for diaphoresis, activity change, appetite change and fatigue.  Respiratory: Negative for cough, chest tightness, shortness of breath and wheezing.   Cardiovascular: Negative for chest pain, palpitations and leg swelling.  Neurological: Negative for dizziness, speech difficulty, weakness, light-headedness, numbness and headaches.      Objective:   Physical Exam  Constitutional: She is oriented to person, place, and time. She appears well-developed and well-nourished. No distress.  HENT:  Head: Normocephalic and atraumatic.  Mouth/Throat: Oropharynx is clear and moist.  Eyes: EOM are normal.  Neck: Normal range of motion.  Cardiovascular: Normal rate and regular rhythm.   Pulmonary/Chest: Effort normal and breath sounds normal.  Abdominal: Soft. Bowel sounds are normal. She exhibits no distension. There is no tenderness. There is no rebound.  Neurological: She is alert and oriented to person, place, and time. Coordination normal.  Skin: Skin is warm and dry.    Filed Vitals:   06/30/14 1606  BP: 148/74  Pulse: 71  Temp: 97.5 F (36.4 C)  TempSrc: Oral  Height: 5\' 4"  (1.626 m)  Weight: 242 lb 8 oz (109.997 kg)  SpO2: 98%      Assessment & Plan:

## 2014-07-01 NOTE — Telephone Encounter (Signed)
Pt forgot to mention at her appt 11/17 that she would need Plavix (90 day supply). All her meds will be 90 day supply unless they can only be 30 day supply. [facility] - [phone]. Pt is requesting this be changed.

## 2014-07-01 NOTE — Assessment & Plan Note (Signed)
Spoke with patient's oral surgeon who assures me that he feels the surgery is safe to proceed on Plavix. Would therefore recommend the patient continue Plavix throughout the surgery. Would also recommend that her breast biopsy is not likely to have extensive bleeding and therefore may be performed while on Plavix. With brief review of the medical records it would appear that her stroke was not related to atrial fibrillation. Given that now her medical problems are fairly well controlled her risk of stroke may be lower than it was immediately proceeding her stroke back in 2007.

## 2014-07-16 ENCOUNTER — Telehealth: Payer: Self-pay | Admitting: Internal Medicine

## 2014-07-16 NOTE — Telephone Encounter (Signed)
No answer and no voice mail.

## 2014-07-16 NOTE — Telephone Encounter (Signed)
Pt request phone call from the assistant concern about her A1C level since 12/12/2013. Please give her a call.

## 2014-07-16 NOTE — Telephone Encounter (Signed)
Spoke to patient and answered her questions

## 2014-08-10 ENCOUNTER — Other Ambulatory Visit: Payer: Self-pay | Admitting: Internal Medicine

## 2014-08-17 ENCOUNTER — Other Ambulatory Visit: Payer: Self-pay | Admitting: Nurse Practitioner

## 2014-08-17 ENCOUNTER — Other Ambulatory Visit: Payer: Self-pay

## 2014-08-17 DIAGNOSIS — R928 Other abnormal and inconclusive findings on diagnostic imaging of breast: Secondary | ICD-10-CM

## 2014-08-17 MED ORDER — ALBUTEROL SULFATE HFA 108 (90 BASE) MCG/ACT IN AERS: 2.0000 | INHALATION_SPRAY | Freq: Four times a day (QID) | RESPIRATORY_TRACT | Status: AC | PRN

## 2014-08-19 ENCOUNTER — Encounter: Payer: Medicare Other | Admitting: Internal Medicine

## 2014-08-21 ENCOUNTER — Ambulatory Visit
Admission: RE | Admit: 2014-08-21 | Discharge: 2014-08-21 | Disposition: A | Discharge status: Home / Self Care | Payer: Medicare Other | Source: Ambulatory Visit | Attending: Nurse Practitioner | Admitting: Nurse Practitioner

## 2014-08-21 ENCOUNTER — Other Ambulatory Visit: Payer: Self-pay | Admitting: Nurse Practitioner

## 2014-08-21 DIAGNOSIS — R928 Other abnormal and inconclusive findings on diagnostic imaging of breast: Secondary | ICD-10-CM

## 2014-08-24 ENCOUNTER — Encounter: Payer: Medicare Other | Admitting: Internal Medicine

## 2014-08-27 ENCOUNTER — Other Ambulatory Visit (INDEPENDENT_AMBULATORY_CARE_PROVIDER_SITE_OTHER): Payer: Medicare Other

## 2014-08-27 ENCOUNTER — Encounter: Payer: Self-pay | Admitting: Internal Medicine

## 2014-08-27 ENCOUNTER — Ambulatory Visit (INDEPENDENT_AMBULATORY_CARE_PROVIDER_SITE_OTHER): Payer: Medicare Other | Admitting: Internal Medicine

## 2014-08-27 VITALS — BP 150/82 | HR 67 | Temp 97.8°F | Resp 16 | Ht 64.0 in | Wt 240.0 lb

## 2014-08-27 DIAGNOSIS — E119 Type 2 diabetes mellitus without complications: Secondary | ICD-10-CM

## 2014-08-27 DIAGNOSIS — E1165 Type 2 diabetes mellitus with hyperglycemia: Secondary | ICD-10-CM

## 2014-08-27 DIAGNOSIS — I10 Essential (primary) hypertension: Secondary | ICD-10-CM

## 2014-08-27 DIAGNOSIS — Z0000 Encounter for general adult medical examination without abnormal findings: Secondary | ICD-10-CM

## 2014-08-27 DIAGNOSIS — IMO0002 Reserved for concepts with insufficient information to code with codable children: Secondary | ICD-10-CM

## 2014-08-27 NOTE — Assessment & Plan Note (Signed)
Per patient her diet and exercise have been very poor the last 3 months and she expects the control to be bad today. Foot exam performed, check HgA1c and urine microalbumin :creatinine since not done since 2014. She is on ARB.

## 2014-08-27 NOTE — Progress Notes (Signed)
Pre visit review using our clinic review tool, if applicable. No additional management support is needed unless otherwise documented below in the visit note. 

## 2014-08-27 NOTE — Patient Instructions (Addendum)
We will check your blood work and check your urine to make sure there are no signs of damage to the kidneys from the diabetes. We will call you back about the results.   Come back in about 3 months to follow up on your diabetes and make sure to work on your diet and exercising.   Health Maintenance Adopting a healthy lifestyle and getting preventive care can go a long way to promote health and wellness. Talk with your health care provider about what schedule of regular examinations is right for you. This is a good chance for you to check in with your provider about disease prevention and staying healthy. In between checkups, there are plenty of things you can do on your own. Experts have done a lot of research about which lifestyle changes and preventive measures are most likely to keep you healthy. Ask your health care provider for more information. WEIGHT AND DIET  Eat a healthy diet  Be sure to include plenty of vegetables, fruits, low-fat dairy products, and lean protein.  Do not eat a lot of foods high in solid fats, added sugars, or salt.  Get regular exercise. This is one of the most important things you can do for your health.  Most adults should exercise for at least 150 minutes each week. The exercise should increase your heart rate and make you sweat (moderate-intensity exercise).  Most adults should also do strengthening exercises at least twice a week. This is in addition to the moderate-intensity exercise.  Maintain a healthy weight  Body mass index (BMI) is a measurement that can be used to identify possible weight problems. It estimates body fat based on height and weight. Your health care provider can help determine your BMI and help you achieve or maintain a healthy weight.  For females 53 years of age and older:   A BMI below 18.5 is considered underweight.  A BMI of 18.5 to 24.9 is normal.  A BMI of 25 to 29.9 is considered overweight.  A BMI of 30 and above is  considered obese.  Watch levels of cholesterol and blood lipids  You should start having your blood tested for lipids and cholesterol at 68 years of age, then have this test every 5 years.  You may need to have your cholesterol levels checked more often if:  Your lipid or cholesterol levels are high.  You are older than 68 years of age.  You are at high risk for heart disease.  CANCER SCREENING   Lung Cancer  Lung cancer screening is recommended for adults 36-32 years old who are at high risk for lung cancer because of a history of smoking.  A yearly low-dose CT scan of the lungs is recommended for people who:  Currently smoke.  Have quit within the past 15 years.  Have at least a 30-pack-year history of smoking. A pack year is smoking an average of one pack of cigarettes a day for 1 year.  Yearly screening should continue until it has been 15 years since you quit.  Yearly screening should stop if you develop a health problem that would prevent you from having lung cancer treatment.  Breast Cancer  Practice breast self-awareness. This means understanding how your breasts normally appear and feel.  It also means doing regular breast self-exams. Let your health care provider know about any changes, no matter how small.  If you are in your 20s or 30s, you should have a clinical breast exam (CBE)  by a health care provider every 1-3 years as part of a regular health exam.  If you are 40 or older, have a CBE every year. Also consider having a breast X-ray (mammogram) every year.  If you have a family history of breast cancer, talk to your health care provider about genetic screening.  If you are at high risk for breast cancer, talk to your health care provider about having an MRI and a mammogram every year.  Breast cancer gene (BRCA) assessment is recommended for women who have family members with BRCA-related cancers. BRCA-related cancers  include:  Breast.  Ovarian.  Tubal.  Peritoneal cancers.  Results of the assessment will determine the need for genetic counseling and BRCA1 and BRCA2 testing. Cervical Cancer Routine pelvic examinations to screen for cervical cancer are no longer recommended for nonpregnant women who are considered low risk for cancer of the pelvic organs (ovaries, uterus, and vagina) and who do not have symptoms. A pelvic examination may be necessary if you have symptoms including those associated with pelvic infections. Ask your health care provider if a screening pelvic exam is right for you.   The Pap test is the screening test for cervical cancer for women who are considered at risk.  If you had a hysterectomy for a problem that was not cancer or a condition that could lead to cancer, then you no longer need Pap tests.  If you are older than 65 years, and you have had normal Pap tests for the past 10 years, you no longer need to have Pap tests.  If you have had past treatment for cervical cancer or a condition that could lead to cancer, you need Pap tests and screening for cancer for at least 20 years after your treatment.  If you no longer get a Pap test, assess your risk factors if they change (such as having a new sexual partner). This can affect whether you should start being screened again.  Some women have medical problems that increase their chance of getting cervical cancer. If this is the case for you, your health care provider may recommend more frequent screening and Pap tests.  The human papillomavirus (HPV) test is another test that may be used for cervical cancer screening. The HPV test looks for the virus that can cause cell changes in the cervix. The cells collected during the Pap test can be tested for HPV.  The HPV test can be used to screen women 30 years of age and older. Getting tested for HPV can extend the interval between normal Pap tests from three to five years.  An HPV  test also should be used to screen women of any age who have unclear Pap test results.  After 68 years of age, women should have HPV testing as often as Pap tests.  Colorectal Cancer  This type of cancer can be detected and often prevented.  Routine colorectal cancer screening usually begins at 68 years of age and continues through 68 years of age.  Your health care provider may recommend screening at an earlier age if you have risk factors for colon cancer.  Your health care provider may also recommend using home test kits to check for hidden blood in the stool.  A small camera at the end of a tube can be used to examine your colon directly (sigmoidoscopy or colonoscopy). This is done to check for the earliest forms of colorectal cancer.  Routine screening usually begins at age 50.  Direct   examination of the colon should be repeated every 5-10 years through 68 years of age. However, you may need to be screened more often if early forms of precancerous polyps or small growths are found. Skin Cancer  Check your skin from head to toe regularly.  Tell your health care provider about any new moles or changes in moles, especially if there is a change in a mole's shape or color.  Also tell your health care provider if you have a mole that is larger than the size of a pencil eraser.  Always use sunscreen. Apply sunscreen liberally and repeatedly throughout the day.  Protect yourself by wearing long sleeves, pants, a wide-brimmed hat, and sunglasses whenever you are outside. HEART DISEASE, DIABETES, AND HIGH BLOOD PRESSURE   Have your blood pressure checked at least every 1-2 years. High blood pressure causes heart disease and increases the risk of stroke.  If you are between 55 years and 79 years old, ask your health care provider if you should take aspirin to prevent strokes.  Have regular diabetes screenings. This involves taking a blood sample to check your fasting blood sugar  level.  If you are at a normal weight and have a low risk for diabetes, have this test once every three years after 68 years of age.  If you are overweight and have a high risk for diabetes, consider being tested at a younger age or more often. PREVENTING INFECTION  Hepatitis B  If you have a higher risk for hepatitis B, you should be screened for this virus. You are considered at high risk for hepatitis B if:  You were born in a country where hepatitis B is common. Ask your health care provider which countries are considered high risk.  Your parents were born in a high-risk country, and you have not been immunized against hepatitis B (hepatitis B vaccine).  You have HIV or AIDS.  You use needles to inject street drugs.  You live with someone who has hepatitis B.  You have had sex with someone who has hepatitis B.  You get hemodialysis treatment.  You take certain medicines for conditions, including cancer, organ transplantation, and autoimmune conditions. Hepatitis C  Blood testing is recommended for:  Everyone born from 1945 through 1965.  Anyone with known risk factors for hepatitis C. Sexually transmitted infections (STIs)  You should be screened for sexually transmitted infections (STIs) including gonorrhea and chlamydia if:  You are sexually active and are younger than 68 years of age.  You are older than 68 years of age and your health care provider tells you that you are at risk for this type of infection.  Your sexual activity has changed since you were last screened and you are at an increased risk for chlamydia or gonorrhea. Ask your health care provider if you are at risk.  If you do not have HIV, but are at risk, it may be recommended that you take a prescription medicine daily to prevent HIV infection. This is called pre-exposure prophylaxis (PrEP). You are considered at risk if:  You are sexually active and do not regularly use condoms or know the HIV status  of your partner(s).  You take drugs by injection.  You are sexually active with a partner who has HIV. Talk with your health care provider about whether you are at high risk of being infected with HIV. If you choose to begin PrEP, you should first be tested for HIV. You should then be tested   every 3 months for as long as you are taking PrEP.  PREGNANCY   If you are premenopausal and you may become pregnant, ask your health care provider about preconception counseling.  If you may become pregnant, take 400 to 800 micrograms (mcg) of folic acid every day.  If you want to prevent pregnancy, talk to your health care provider about birth control (contraception). OSTEOPOROSIS AND MENOPAUSE   Osteoporosis is a disease in which the bones lose minerals and strength with aging. This can result in serious bone fractures. Your risk for osteoporosis can be identified using a bone density scan.  If you are 65 years of age or older, or if you are at risk for osteoporosis and fractures, ask your health care provider if you should be screened.  Ask your health care provider whether you should take a calcium or vitamin D supplement to lower your risk for osteoporosis.  Menopause may have certain physical symptoms and risks.  Hormone replacement therapy may reduce some of these symptoms and risks. Talk to your health care provider about whether hormone replacement therapy is right for you.  HOME CARE INSTRUCTIONS   Schedule regular health, dental, and eye exams.  Stay current with your immunizations.   Do not use any tobacco products including cigarettes, chewing tobacco, or electronic cigarettes.  If you are pregnant, do not drink alcohol.  If you are breastfeeding, limit how much and how often you drink alcohol.  Limit alcohol intake to no more than 1 drink per day for nonpregnant women. One drink equals 12 ounces of beer, 5 ounces of wine, or 1 ounces of hard liquor.  Do not use street  drugs.  Do not share needles.  Ask your health care provider for help if you need support or information about quitting drugs.  Tell your health care provider if you often feel depressed.  Tell your health care provider if you have ever been abused or do not feel safe at home. Document Released: 02/13/2011 Document Revised: 12/15/2013 Document Reviewed: 07/02/2013 [employer] Patient Information 2015 [employer]. This information is not intended to replace advice given to you by your health care provider. Make sure you discuss any questions you have with your health care provider.  

## 2014-08-27 NOTE — Assessment & Plan Note (Signed)
Routine testing for diabetes ordered and foot exam performed. Colonoscopy deferred at last visit and reminded her that this is a screening for colon cancer. Provided with a list of screening activities. Mammogram repeat in 1 year. Suspicious lesion gone when she went to get biopsy performed. Lipid not due. Check CMP and HgA1c today.

## 2014-08-27 NOTE — Progress Notes (Signed)
   Subjective:    Patient ID: Jamie KaufmannLinda Jacquez, female    DOB: 1946-07-20, 69 y.o.   MRN: [id number]  HPI Here for medicare wellness: she is a 69 YO female with PMH of DM type 2, HTN, arthritis, hx stroke. She has some pain in right ankle which she has been struggling with.   Diet: poor Physical activity: sedentary Depression/mood screen: negative, some adjustment but handling Hearing: intact to whispered voice Visual acuity: wears glasses or contacts, performs annual eye exam  ADLs: capable Fall risk: low, right ankle instability Home safety: good Cognitive evaluation: intact to orientation, naming, recall and repetition EOL planning: adv directives, discussed has not thought about  I have personally reviewed and have noted 1. The patient's medical and social history 2. Their use of alcohol, tobacco or illicit drugs 3. Their current medications and supplements 4. The patient's functional ability including ADL's, fall risks, home safety risks and hearing or visual impairment. 5. Diet and physical activities 6. Evidence for depression or mood disorders 7. Care team reviewed and updated as appropriate.   Review of Systems  Constitutional: Negative for diaphoresis, activity change, appetite change and fatigue.  HENT: Negative.   Respiratory: Negative for cough, chest tightness, shortness of breath and wheezing.   Cardiovascular: Negative for chest pain, palpitations and leg swelling.  Gastrointestinal: Negative for abdominal pain, constipation and abdominal distention.  Musculoskeletal: Positive for arthralgias. Negative for myalgias, back pain and gait problem.  Neurological: Negative for dizziness, speech difficulty, weakness, light-headedness, numbness and headaches.  Psychiatric/Behavioral: Positive for decreased concentration. Negative for behavioral problems, sleep disturbance and dysphoric mood.      Objective:   Physical Exam  Constitutional: She is oriented to person, place,  and time. She appears well-developed and well-nourished. No distress.  HENT:  Head: Normocephalic and atraumatic.  Mouth/Throat: Oropharynx is clear and moist.  Eyes: EOM are normal.  Neck: Normal range of motion.  Cardiovascular: Normal rate and regular rhythm.   Pulmonary/Chest: Effort normal and breath sounds normal. No respiratory distress. She has no wheezes.  Abdominal: Soft. Bowel sounds are normal. She exhibits no distension. There is no tenderness. There is no rebound.  Musculoskeletal: She exhibits no edema.  Neurological: She is alert and oriented to person, place, and time. Coordination normal.  Skin: Skin is warm and dry.   Filed Vitals:   08/27/14 0833  BP: 150/100  Pulse: 67  Temp: 97.8 F (36.6 C)  TempSrc: Oral  Resp: 16  Height: 5\' 4"  (1.626 m)  Weight: 240 lb (108.863 kg)  SpO2: 98%      Assessment & Plan:

## 2014-08-27 NOTE — Assessment & Plan Note (Signed)
BP mildly elevated today although repeat lower. Given her history of stroke would like BP to be <140 and <80. She is not wanting to switch at today's visit but will discuss again next time if still elevated. Check BMP for renal function and potassium levels.

## 2014-08-28 LAB — COMPREHENSIVE METABOLIC PANEL
ALK PHOS: 49 U/L (ref 39–117)
ALT: 15 U/L (ref 0–35)
AST: 14 U/L (ref 0–37)
Albumin: 4.2 g/dL (ref 3.5–5.2)
BUN: 27 mg/dL — AB (ref 6–23)
CALCIUM: 9.5 mg/dL (ref 8.4–10.5)
CHLORIDE: 101 meq/L (ref 96–112)
CO2: 29 meq/L (ref 19–32)
CREATININE: 1.11 mg/dL (ref 0.40–1.20)
GFR: 52.08 mL/min — ABNORMAL LOW (ref >60.00)
Glucose, Bld: 156 mg/dL — ABNORMAL HIGH (ref 70–99)
Potassium: 4.9 mEq/L (ref 3.5–5.1)
Sodium: 138 mEq/L (ref 135–145)
Total Bilirubin: 0.3 mg/dL (ref 0.2–1.2)
Total Protein: 7.8 g/dL (ref 6.0–8.3)

## 2014-08-28 LAB — MICROALBUMIN / CREATININE URINE RATIO
CREATININE, U: 99 mg/dL
Microalb Creat Ratio: 13.8 mg/g (ref 0.0–30.0)
Microalb, Ur: 13.7 mg/dL — ABNORMAL HIGH (ref 0.0–1.9)

## 2014-08-28 LAB — HEMOGLOBIN A1C: Hgb A1c MFr Bld: 7.3 % — ABNORMAL HIGH (ref 4.6–6.5)

## 2014-09-18 ENCOUNTER — Other Ambulatory Visit: Payer: Self-pay | Admitting: Internal Medicine

## 2014-09-23 ENCOUNTER — Other Ambulatory Visit: Payer: Self-pay | Admitting: Geriatric Medicine

## 2014-09-23 MED ORDER — LOSARTAN POTASSIUM 100 MG PO TABS: 100.0000 mg | ORAL_TABLET | Freq: Every day | ORAL | Status: DC

## 2014-10-07 ENCOUNTER — Telehealth: Payer: Self-pay | Admitting: Internal Medicine

## 2014-10-07 NOTE — Telephone Encounter (Signed)
States insurance has changed.  The equipment that ins will provide for glucose is not covered.  They are wanting patient to switch to acucheck.   Patient would like script for all [facility] [address].

## 2014-10-09 MED ORDER — BLOOD GLUCOSE METER KIT: PACK | Status: DC

## 2014-10-09 MED ORDER — ACCU-CHEK SOFT TOUCH LANCETS MISC: Status: DC

## 2014-10-09 MED ORDER — GLUCOSE BLOOD VI STRP: ORAL_STRIP | Status: DC

## 2014-10-09 NOTE — Telephone Encounter (Signed)
erx for all DM testing supplies sent.

## 2014-11-26 ENCOUNTER — Encounter: Payer: Self-pay | Admitting: Internal Medicine

## 2014-11-26 ENCOUNTER — Other Ambulatory Visit (INDEPENDENT_AMBULATORY_CARE_PROVIDER_SITE_OTHER): Payer: Medicare Other

## 2014-11-26 ENCOUNTER — Ambulatory Visit (INDEPENDENT_AMBULATORY_CARE_PROVIDER_SITE_OTHER): Payer: Medicare Other | Admitting: Internal Medicine

## 2014-11-26 ENCOUNTER — Telehealth: Payer: Self-pay | Admitting: Internal Medicine

## 2014-11-26 VITALS — BP 156/92 | HR 68 | Temp 98.3°F | Resp 18 | Ht 64.0 in | Wt 237.4 lb

## 2014-11-26 DIAGNOSIS — E119 Type 2 diabetes mellitus without complications: Secondary | ICD-10-CM | POA: Diagnosis not present

## 2014-11-26 DIAGNOSIS — I10 Essential (primary) hypertension: Secondary | ICD-10-CM

## 2014-11-26 DIAGNOSIS — E559 Vitamin D deficiency, unspecified: Secondary | ICD-10-CM | POA: Diagnosis not present

## 2014-11-26 DIAGNOSIS — R05 Cough: Secondary | ICD-10-CM | POA: Diagnosis not present

## 2014-11-26 DIAGNOSIS — R053 Chronic cough: Secondary | ICD-10-CM

## 2014-11-26 DIAGNOSIS — J410 Simple chronic bronchitis: Secondary | ICD-10-CM

## 2014-11-26 LAB — LIPID PANEL
CHOLESTEROL: 129 mg/dL (ref 0–200)
HDL: 40.1 mg/dL (ref >39.00)
LDL Cholesterol: 50 mg/dL (ref 0–99)
NonHDL: 88.9
TRIGLYCERIDES: 196 mg/dL — AB (ref 0.0–149.0)
Total CHOL/HDL Ratio: 3
VLDL: 39.2 mg/dL (ref 0.0–40.0)

## 2014-11-26 LAB — HEMOGLOBIN A1C: HEMOGLOBIN A1C: 7.2 % — AB (ref 4.6–6.5)

## 2014-11-26 LAB — VITAMIN D 25 HYDROXY (VIT D DEFICIENCY, FRACTURES): VITD: 29.56 ng/mL — ABNORMAL LOW (ref 30.00–100.00)

## 2014-11-26 LAB — MAGNESIUM: Magnesium: 1.9 mg/dL (ref 1.5–2.5)

## 2014-11-26 NOTE — Progress Notes (Signed)
Pre visit review using our clinic review tool, if applicable. No additional management support is needed unless otherwise documented below in the visit note. 

## 2014-11-26 NOTE — Patient Instructions (Signed)
We will get some of the labs that the chiropractic doctor needed today and then mail you the results as well as the results from other tests that have been done recently that they need.   We will call you back with the results.   Work on increasing exercise to at least 3 times per week to help with your health. The extra weight that you are carrying around is causing some of the chronic pain in the legs and the back.  Exercise to Stay Healthy Exercise helps you become and stay healthy. EXERCISE IDEAS AND TIPS Choose exercises that:  You enjoy.  Fit into your day. You do not need to exercise really hard to be healthy. You can do exercises at a slow or medium level and stay healthy. You can:  Stretch before and after working out.  Try yoga, Pilates, or tai chi.  Lift weights.  Walk fast, swim, jog, run, climb stairs, bicycle, dance, or rollerskate.  Take aerobic classes. Exercises that burn about 150 calories:  Running 1  miles in 15 minutes.  Playing volleyball for 45 to 60 minutes.  Washing and waxing a car for 45 to 60 minutes.  Playing touch football for 45 minutes.  Walking 1  miles in 35 minutes.  Pushing a stroller 1  miles in 30 minutes.  Playing basketball for 30 minutes.  Raking leaves for 30 minutes.  Bicycling 5 miles in 30 minutes.  Walking 2 miles in 30 minutes.  Dancing for 30 minutes.  Shoveling snow for 15 minutes.  Swimming laps for 20 minutes.  Walking up stairs for 15 minutes.  Bicycling 4 miles in 15 minutes.  Gardening for 30 to 45 minutes.  Jumping rope for 15 minutes.  Washing windows or floors for 45 to 60 minutes. Document Released: 09/02/2010 Document Revised: 10/23/2011 Document Reviewed: 09/02/2010 [facility] Patient Information 2015 [place], [place]. This information is not intended to replace advice given to you by your health care provider. Make sure you discuss any questions you have with your health care provider.

## 2014-11-28 NOTE — Assessment & Plan Note (Signed)
She does not think that she has true lung disease and since she is having some chronic cough she would like proof. Checking PFTs since she has not had any and no wheezing. She is currently labeled as chronic bronchitis but not sure she meets criteria at this time. Non-smoker now.

## 2014-11-28 NOTE — Assessment & Plan Note (Signed)
Uncontrolled and moderate exacerbation. Given her poor vision she does not feel able to follow her diet and it is making it harder for her to exercise (limited vision). This is not good for her since her last HgA1c was poor and she was supposed to be working on that. Unfortunately she is having crisis after crisis in her other health and financially is not able to handle them. Will check HgA1c and will likely need to adjust regimen and add agent as I not suspect that her control will be improved from prior. She does not want to add medication but talked with her about the fact that her sugars are running too high and can cause her body permanent damage. Continue jentadueto for now and on losartan.

## 2014-11-28 NOTE — Progress Notes (Signed)
   Subjective:    Patient ID: Jamie Aguirre, female    DOB: 01/30/1946, [age] y.o.   MRN: [id number]  HPI The patient is struggling with her type 2 diabetes and is coming back to follow up. She has had a few more struggles since that time. She is still struggling to find a dentist to do some work for her with her finances and now she needs cataract surgery and her eye doctor wants the money up front and she cannot afford. Her vision is being impaired and she cannot see well anymore. This makes it difficult to prepare her food and she is using more prepared foods. She knows that this is not ideal for her diabetes. She has not been checking her sugars. She has been taking her medicines as she should. No problems with feeling like she has had low sugars such as shaking, confusion. Denies polyuria, increased thirst. Denies other new complaints. She has not been exercising lately although she knows she should be doing this.   Review of Systems  Constitutional: Negative for diaphoresis, activity change, appetite change and fatigue.  HENT: Positive for dental problem.   Eyes: Positive for visual disturbance.  Respiratory: Negative for cough, chest tightness, shortness of breath and wheezing.   Cardiovascular: Negative for chest pain, palpitations and leg swelling.  Gastrointestinal: Negative for abdominal pain, constipation and abdominal distention.  Musculoskeletal: Positive for arthralgias. Negative for myalgias, back pain and gait problem.  Neurological: Negative for dizziness, speech difficulty, weakness, light-headedness, numbness and headaches.  Psychiatric/Behavioral: Positive for decreased concentration. Negative for behavioral problems, sleep disturbance and dysphoric mood.      Objective:   Physical Exam  Constitutional: She is oriented to person, place, and time. She appears well-developed and well-nourished. No distress.  HENT:  Head: Normocephalic and atraumatic.  Mouth/Throat: Oropharynx  is clear and moist.  Eyes: EOM are normal.  Neck: Normal range of motion.  Cardiovascular: Normal rate and regular rhythm.   Pulmonary/Chest: Effort normal and breath sounds normal. No respiratory distress. She has no wheezes.  Abdominal: Soft. Bowel sounds are normal. She exhibits no distension. There is no tenderness. There is no rebound.  Musculoskeletal: She exhibits no edema.  Neurological: She is alert and oriented to person, place, and time. Coordination normal.  Skin: Skin is warm and dry.   Filed Vitals:   11/26/14 0941  BP: 156/92  Pulse: 68  Temp: 98.3 F (36.8 C)  TempSrc: Oral  Resp: 18  Height: 5\' 4"  (1.626 m)  Weight: 237 lb 6.4 oz (107.684 kg)  SpO2: 98%      Assessment & Plan:

## 2014-11-28 NOTE — Assessment & Plan Note (Signed)
BP controlled today on losartan and metoprolol-xl. Reviewed recent labs and no need to repeat today.

## 2014-12-01 MED ORDER — CHOLECALCIFEROL 1.25 MG (50000 UT) PO TABS: 50000.0000 [IU] | ORAL_TABLET | ORAL | Status: DC

## 2014-12-01 NOTE — Telephone Encounter (Signed)
Pt called in and said that she take 3 Vit D in the morning and 2 tabs at night.  She takes the 1000 iu tabs .  She states she has been taking them and not sure why it is still low

## 2014-12-01 NOTE — Telephone Encounter (Signed)
Patient will go pick up the Vitamin D from the pharmacy and will continue to take the 5000 units daily.

## 2014-12-01 NOTE — Telephone Encounter (Signed)
We will send in [id number] unit pills for vitamin D to boost her levels. She should take 1 pill a week for 4 weeks and can keep taking the 5000 units per day.

## 2014-12-25 ENCOUNTER — Other Ambulatory Visit: Payer: Self-pay | Admitting: Internal Medicine

## 2015-01-06 ENCOUNTER — Encounter: Payer: Self-pay | Admitting: Internal Medicine

## 2015-01-06 ENCOUNTER — Ambulatory Visit (INDEPENDENT_AMBULATORY_CARE_PROVIDER_SITE_OTHER)
Admission: RE | Admit: 2015-01-06 | Discharge: 2015-01-06 | Disposition: A | Discharge status: Home / Self Care | Payer: Medicare Other | Source: Ambulatory Visit | Attending: Internal Medicine | Admitting: Internal Medicine

## 2015-01-06 ENCOUNTER — Other Ambulatory Visit (INDEPENDENT_AMBULATORY_CARE_PROVIDER_SITE_OTHER): Payer: Medicare Other

## 2015-01-06 ENCOUNTER — Other Ambulatory Visit: Payer: Self-pay | Admitting: Internal Medicine

## 2015-01-06 ENCOUNTER — Ambulatory Visit (INDEPENDENT_AMBULATORY_CARE_PROVIDER_SITE_OTHER): Payer: Medicare Other | Admitting: Internal Medicine

## 2015-01-06 VITALS — BP 144/82 | HR 63 | Temp 98.0°F | Resp 16 | Wt 225.2 lb

## 2015-01-06 DIAGNOSIS — G8929 Other chronic pain: Secondary | ICD-10-CM

## 2015-01-06 DIAGNOSIS — R109 Unspecified abdominal pain: Principal | ICD-10-CM

## 2015-01-06 DIAGNOSIS — J411 Mucopurulent chronic bronchitis: Secondary | ICD-10-CM

## 2015-01-06 DIAGNOSIS — R1011 Right upper quadrant pain: Secondary | ICD-10-CM | POA: Diagnosis not present

## 2015-01-06 LAB — CBC WITH DIFFERENTIAL/PLATELET
BASOS ABS: 0.1 10*3/uL (ref 0.0–0.1)
BASOS PCT: 0.6 % (ref 0.0–3.0)
EOS ABS: 0.6 10*3/uL (ref 0.0–0.7)
Eosinophils Relative: 6.9 % — ABNORMAL HIGH (ref 0.0–5.0)
HEMATOCRIT: 35.5 % — AB (ref 36.0–46.0)
HEMOGLOBIN: 12 g/dL (ref 12.0–15.0)
LYMPHS PCT: 17.8 % (ref 12.0–46.0)
Lymphs Abs: 1.5 10*3/uL (ref 0.7–4.0)
MCHC: 33.8 g/dL (ref 30.0–36.0)
MCV: 83.5 fl (ref 78.0–100.0)
Monocytes Absolute: 0.7 10*3/uL (ref 0.1–1.0)
Monocytes Relative: 8.3 % (ref 3.0–12.0)
Neutro Abs: 5.8 10*3/uL (ref 1.4–7.7)
Neutrophils Relative %: 66.4 % (ref 43.0–77.0)
Platelets: 319 10*3/uL (ref 150.0–400.0)
RBC: 4.25 Mil/uL (ref 3.87–5.11)
RDW: 16.1 % — AB (ref 11.5–15.5)
WBC: 8.7 10*3/uL (ref 4.0–10.5)

## 2015-01-06 LAB — URINALYSIS, ROUTINE W REFLEX MICROSCOPIC
Bilirubin Urine: NEGATIVE
HGB URINE DIPSTICK: NEGATIVE
KETONES UR: NEGATIVE
Nitrite: NEGATIVE
Specific Gravity, Urine: 1.015 (ref 1.000–1.030)
Urine Glucose: NEGATIVE
Urobilinogen, UA: 0.2 (ref 0.0–1.0)
pH: 7 (ref 5.0–8.0)

## 2015-01-06 MED ORDER — GABAPENTIN 100 MG PO CAPS: ORAL_CAPSULE | ORAL | Status: DC

## 2015-01-06 NOTE — Patient Instructions (Signed)
  Your next office appointment will be determined based upon review of your pending labs  and  xrays  Those written interpretation of the lab results and instructions will be transmitted to you by mail for your records.  Critical results will be called.   Followup as needed for any active or acute issue. Please report any significant change in your symptoms. 

## 2015-01-06 NOTE — Progress Notes (Signed)
   Subjective:    Patient ID: Jamie Aguirre, female    DOB: 09-Apr-1946, 69 y.o.   MRN: [id number]  HPI She has had right flank pain for at least 6 weeks. There was no trauma or injury before onset. As of 5/20 it did increase in severity,described as sharp,up to level 8.5. It is worse with rotation of the thorax or walking to excess. It is better if she leans over her walker. It has been aggravated by sleeping in the lateral decubitus position.  Also for several months she describes yellow sputum production the morning & evening. She states she was told "don't worry about this ". She's not been treated for this. She does feel it is getting better.  She has no significant genitourinary, GI, dermatologic, or neurologic associated symptoms.   Review of Systems Dysuria, pyuria, hematuria, frequency, nocturia or polyuria are denied. Unexplained weight loss, abdominal pain, significant dyspepsia, dysphagia, melena, rectal bleeding, or persistently small caliber stools are denied. Fever, chills,or sweats not present. There is no numbness, tingling, or weakness in extremities.   No loss of control of bladder or bowels. Radicular type pain absent;but pain can cross the lower back.      Objective:   Physical Exam  Pertinent or positive findings include: She has lower partial. Abdomen is protuberant.  Breath sounds are markedly decreased.  Pedal pulses are decreased.  General appearance :adequately nourished; in no distress. BMI: 38.65 Eyes: No conjunctival inflammation or scleral icterus is present. Oral exam:  Lips and gums are healthy appearing.There is no oropharyngeal erythema or exudate noted. Heart:  Normal rate and regular rhythm. S1 and S2 normal without gallop, murmur, click, rub or other extra sounds   Lungs:Chest clear to auscultation; no wheezes, rhonchi,rales ,or rubs present.No increased work of breathing.  Abdomen: bowel sounds normal, soft and non-tender without masses,  organomegaly or hernias noted.  No guarding or rebound. No flank tenderness to percussion. Vascular : all pulses equal ; no bruits present. Skin:Warm & dry.  Intact without suspicious lesions or rashes ; no tenting or jaundice  Lymphatic: No lymphadenopathy is noted about the head, neck, axilla Neuro: Strength, tone decreased.       Assessment & Plan:  #1 right flank pain 6 weeks rule out thoracolumbar radiculopathy  #2 chronic bronchitis;antibiotics declined  Plan: See orders and recommendations

## 2015-01-06 NOTE — Progress Notes (Signed)
Pre visit review using our clinic review tool, if applicable. No additional management support is needed unless otherwise documented below in the visit note. 

## 2015-01-07 ENCOUNTER — Other Ambulatory Visit: Payer: Medicare Other

## 2015-01-07 ENCOUNTER — Other Ambulatory Visit: Payer: Self-pay | Admitting: Internal Medicine

## 2015-01-07 ENCOUNTER — Other Ambulatory Visit: Payer: Self-pay

## 2015-01-07 DIAGNOSIS — R82998 Other abnormal findings in urine: Secondary | ICD-10-CM

## 2015-01-07 DIAGNOSIS — D649 Anemia, unspecified: Secondary | ICD-10-CM

## 2015-01-08 ENCOUNTER — Other Ambulatory Visit: Payer: Self-pay | Admitting: Geriatric Medicine

## 2015-01-08 ENCOUNTER — Telehealth: Payer: Self-pay | Admitting: Internal Medicine

## 2015-01-08 ENCOUNTER — Telehealth: Payer: Self-pay | Admitting: Geriatric Medicine

## 2015-01-08 MED ORDER — LINAGLIPTIN-METFORMIN HCL 2.5-1000 MG PO TABS: 1.0000 | ORAL_TABLET | Freq: Two times a day (BID) | ORAL | Status: DC

## 2015-01-08 MED ORDER — PREDNISONE 10 MG PO TABS: 10.0000 mg | ORAL_TABLET | Freq: Three times a day (TID) | ORAL | Status: DC

## 2015-01-08 NOTE — Telephone Encounter (Signed)
Pt called and would like a call back with her labs and would like a copy of them.  It is ok for me to print them out for her so she can come by and pick them up?

## 2015-01-08 NOTE — Telephone Encounter (Signed)
Patient would like to know if it is ok for her to take prednisone for her inflammation in her back. She is aware of all lab and test results and was told to hold off on prednisone until results were back. If she is able to take prednisone, she would like us to call it in for her. Please advise, thanks.

## 2015-01-08 NOTE — Telephone Encounter (Signed)
Spoke to Dr. Alwyn RenHopper and he advised to send in prednisone 10 mg TID #15.

## 2015-01-08 NOTE — Telephone Encounter (Signed)
Per Dr. Alwyn RenHopper send in prednisone 10 TID #15. Sent to [facility].

## 2015-01-09 LAB — CULTURE, URINE COMPREHENSIVE: Colony Count: >=100000

## 2015-01-10 ENCOUNTER — Other Ambulatory Visit: Payer: Self-pay | Admitting: Internal Medicine

## 2015-01-10 MED ORDER — NITROFURANTOIN MONOHYD MACRO 100 MG PO CAPS: 100.0000 mg | ORAL_CAPSULE | Freq: Two times a day (BID) | ORAL | Status: DC

## 2015-01-13 ENCOUNTER — Other Ambulatory Visit (INDEPENDENT_AMBULATORY_CARE_PROVIDER_SITE_OTHER): Payer: Medicare Other

## 2015-01-13 DIAGNOSIS — Z79899 Other long term (current) drug therapy: Secondary | ICD-10-CM

## 2015-01-13 DIAGNOSIS — D649 Anemia, unspecified: Secondary | ICD-10-CM

## 2015-01-13 LAB — CBC WITH DIFFERENTIAL/PLATELET
Basophils Absolute: 0 10*3/uL (ref 0.0–0.1)
Basophils Relative: 0.4 % (ref 0.0–3.0)
Eosinophils Absolute: 0.1 10*3/uL (ref 0.0–0.7)
Eosinophils Relative: 0.9 % (ref 0.0–5.0)
HCT: 38.1 % (ref 36.0–46.0)
Hemoglobin: 12.7 g/dL (ref 12.0–15.0)
Lymphocytes Relative: 12.8 % (ref 12.0–46.0)
Lymphs Abs: 1.6 10*3/uL (ref 0.7–4.0)
MCHC: 33.3 g/dL (ref 30.0–36.0)
MCV: 85.3 fl (ref 78.0–100.0)
Monocytes Absolute: 0.7 10*3/uL (ref 0.1–1.0)
Monocytes Relative: 5.5 % (ref 3.0–12.0)
Neutro Abs: 10.3 10*3/uL — ABNORMAL HIGH (ref 1.4–7.7)
Neutrophils Relative %: 80.4 % — ABNORMAL HIGH (ref 43.0–77.0)
Platelets: 396 10*3/uL (ref 150.0–400.0)
RBC: 4.46 Mil/uL (ref 3.87–5.11)
RDW: 15.6 % — ABNORMAL HIGH (ref 11.5–15.5)
WBC: 12.8 10*3/uL — ABNORMAL HIGH (ref 4.0–10.5)

## 2015-01-13 LAB — VITAMIN B12: VITAMIN B 12: 300 pg/mL (ref 211–911)

## 2015-01-13 LAB — IBC PANEL
Iron: 52 ug/dL (ref 42–145)
Saturation Ratios: 12.1 % — ABNORMAL LOW (ref 20.0–50.0)
Transferrin: 307 mg/dL (ref 212.0–360.0)

## 2015-01-28 ENCOUNTER — Encounter (INDEPENDENT_AMBULATORY_CARE_PROVIDER_SITE_OTHER): Payer: Medicare Other

## 2015-01-28 DIAGNOSIS — R05 Cough: Secondary | ICD-10-CM

## 2015-01-28 DIAGNOSIS — R0600 Dyspnea, unspecified: Secondary | ICD-10-CM | POA: Diagnosis not present

## 2015-01-28 DIAGNOSIS — R053 Chronic cough: Secondary | ICD-10-CM

## 2015-01-28 LAB — PULMONARY FUNCTION TEST
DL/VA % PRED: 103 %
DL/VA: 4.71 ml/min/mmHg/L
DLCO unc % pred: 69 %
DLCO unc: 14.97 ml/min/mmHg
FEF 25-75 PRE: 0.96 L/s
FEF 25-75 Post: 1.96 L/sec
FEF2575-%Change-Post: 103 %
FEF2575-%Pred-Post: 103 %
FEF2575-%Pred-Pre: 50 %
FEV1-%Change-Post: 17 %
FEV1-%PRED-POST: 70 %
FEV1-%PRED-PRE: 59 %
FEV1-POST: 1.51 L
FEV1-PRE: 1.28 L
FEV1FVC-%Change-Post: 10 %
FEV1FVC-%Pred-Pre: 99 %
FEV6-%Change-Post: 6 %
FEV6-%Pred-Post: 66 %
FEV6-%Pred-Pre: 62 %
FEV6-POST: 1.81 L
FEV6-Pre: 1.7 L
FEV6FVC-%Pred-Post: 105 %
FEV6FVC-%Pred-Pre: 105 %
FVC-%CHANGE-POST: 6 %
FVC-%Pred-Post: 63 %
FVC-%Pred-Pre: 59 %
FVC-PRE: 1.7 L
FVC-Post: 1.81 L
POST FEV1/FVC RATIO: 83 %
PRE FEV1/FVC RATIO: 76 %
PRE FEV6/FVC RATIO: 100 %
Post FEV6/FVC ratio: 100 %

## 2015-02-04 ENCOUNTER — Telehealth: Payer: Self-pay | Admitting: Internal Medicine

## 2015-02-04 NOTE — Telephone Encounter (Signed)
Printed and placed up front .

## 2015-02-04 NOTE — Telephone Encounter (Signed)
Pt called in and would like to come pick up a copy of her pulmonary test that Dr Dorise Hiss had done?  She wants to come to office and pick it up.

## 2015-02-17 ENCOUNTER — Ambulatory Visit (INDEPENDENT_AMBULATORY_CARE_PROVIDER_SITE_OTHER): Payer: Medicare Other | Admitting: Internal Medicine

## 2015-02-17 ENCOUNTER — Encounter: Payer: Self-pay | Admitting: Internal Medicine

## 2015-02-17 VITALS — BP 178/82 | HR 70 | Temp 97.9°F | Resp 16 | Wt 228.0 lb

## 2015-02-17 DIAGNOSIS — R202 Paresthesia of skin: Secondary | ICD-10-CM | POA: Diagnosis not present

## 2015-02-17 DIAGNOSIS — I10 Essential (primary) hypertension: Secondary | ICD-10-CM

## 2015-02-17 DIAGNOSIS — G43809 Other migraine, not intractable, without status migrainosus: Secondary | ICD-10-CM | POA: Diagnosis not present

## 2015-02-17 NOTE — Progress Notes (Signed)
   Subjective:    Patient ID: Jamie KaufmannLinda Aguirre, female    DOB: April 26, 1946, 69 y.o.   MRN: [id number]  HPI She was seen at urgent care 02/11/15 after she developed numbness acutely in the left lower jaw, left elbow, and right foot. The jaw &  elbow symptoms resolved after approximately 1 hour. The foot returned to normal sensation early that evening. There was no associated muscular deficit. There was no cardiac or neurologic prodrome prior to the event.  She went back to the urgent care 7/2 at the advice of a relative. They recommended the metoprolol be increased to twice a day. She has not initiated this as yet  They also recommended aspirin 81 mg daily. She had been taking aspirin a half every 2 days because of easy bleeding.  She does not check her blood pressure or glucoses at home.  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 She sees colors and shapes w/o associated headache.    Review of Systems Fever, chills, sweats, or unexplained weight loss not present. No significant headaches. Mental status change or memory loss denied. Blurred vision , diplopia or vision loss absent. Vertigo, near syncope or imbalance denied. There is no numbness, tingling, or weakness in extremities.   No loss of control of bladder or bowels. Radicular type pain absent. No seizure stigmata. 2-3 times per week she has 1-4 loose bowel movements without other associated GI symptoms    Objective:   Physical Exam  Pertinent or positive findings include: She has a partial dental appliance. Hearing aids are worn bilaterally. The right pupil is asymmetrically located.FOV was grossly normal. Abdomen is protuberant. She has crepitus of her knees. Trace edema is present. She is unable to do heel or toe walking due to "bursitis and Achilles tendinitis". No definite cranial nerve deficit otherwise.  General appearance :adequately nourished; in no distress.  Eyes: No conjunctival inflammation or scleral  icterus is present.  Oral exam:  Lips and gums are healthy appearing.There is no oropharyngeal erythema or exudate noted.   Heart:  Normal rate and regular rhythm. S1 and S2 normal without gallop, murmur, click, rub or other extra sounds    Lungs:Chest clear to auscultation; no wheezes, rhonchi,rales ,or rubs present.No increased work of breathing.   Abdomen: bowel sounds normal, soft and non-tender without masses, organomegaly or hernias noted.  No guarding or rebound.   Vascular : all pulses equal ; no bruits present.  Skin:Warm & dry.  Intact without suspicious lesions or rashes ; no tenting or jaundice   Lymphatic: No lymphadenopathy is noted about the head, neck, axilla   Neuro: Strength, tone & DTRs normal.         Assessment & Plan:  #1 paresthesias of the left lower jaw, left elbow, and right foot which resolved within 24 hours    #2 hypertension, uncontrolled  #3 visual migraines  Plan: The risk of untreated blood pressure was discussed. Neurologic evaluation is needed. The symptoms do not correlate with distinct neurologic dermatome. Other etiology such as MS must be ruled out.

## 2015-02-17 NOTE — Progress Notes (Signed)
Pre visit review using our clinic review tool, if applicable. No additional management support is needed unless otherwise documented below in the visit note. 

## 2015-02-17 NOTE — Patient Instructions (Signed)
Minimal Blood Pressure Goal= AVERAGE < 140/90;  Ideal is an AVERAGE < 135/85. This AVERAGE should be calculated from @ least 5 BP readings taken @ different times of day on different days of week. You should not respond to isolated BP readings , but rather the AVERAGE for that week .Please bring your  blood pressure cuff to office visits to verify that it is reliable.It  can also be checked against the blood pressure device at the pharmacy. Finger or wrist cuffs are not dependable; an arm cuff is.Please take the Metoprolol twice a day.  The Neurology  referral will be scheduled and you'll be notified of the time.Please call the Referral Co-Ordinator @ [phone] if you have not been notified of appointment time within 7-10 days.

## 2015-03-18 ENCOUNTER — Other Ambulatory Visit: Payer: Self-pay | Admitting: Internal Medicine

## 2015-04-05 ENCOUNTER — Encounter: Payer: Self-pay | Admitting: Neurology

## 2015-04-05 ENCOUNTER — Ambulatory Visit (INDEPENDENT_AMBULATORY_CARE_PROVIDER_SITE_OTHER): Payer: Medicare Other | Admitting: Neurology

## 2015-04-05 VITALS — BP 130/80 | HR 64 | Ht 64.0 in | Wt 232.2 lb

## 2015-04-05 DIAGNOSIS — R29818 Other symptoms and signs involving the nervous system: Secondary | ICD-10-CM

## 2015-04-05 DIAGNOSIS — R42 Dizziness and giddiness: Secondary | ICD-10-CM | POA: Diagnosis not present

## 2015-04-05 NOTE — Patient Instructions (Addendum)
1.  US carotids 2.  Continue medications as you are taking them 3.  Encouraged to stay active and maintain tight glycemic 4.  Return to clinic as needed   You will always be at an increased risk of stroke.  Stroke is a medical emergency.  Know these warning signs of stroke. Every second counts:  Sudden numbness or weakness of the face, arm or leg, especially on one side of the body,  Sudden confusion, trouble speaking or understanding,  Sudden trouble seeing in one eye or both eyes,  Sudden trouble walking, dizziness, loss of balance or coordination,  Sudden severe headache with no known cause.  If you or someone with you has one or more of these signs, don't delay!  Immediately call 9-1-1, or the emergency medical services (EMS) number so an ambulance (ideally with advanced life support) can be sent for you.  Also, check the time so that you will know when the symptoms first appeared. It's very important to take immediate action. Medical treatment may be available if action is taken early enough.   General guidelines for stroke risk factor management, if present  Hypertension  Target blood pressure <140/90, <130/80 for high risk; normal is 120/80  Hyperlipidemia  Target total cholesterol < 200  Target LDL <100, < 70 for high risk  Target HDL >45 for men, >55 for women  Target triglycerides <150  Diabetes  Target HgbA1c <7%  Smoking  Target is smoking cessation  Physical inactivity  Target is exercise at least 3 times per week  Target waist circumference, in inches is <35 for women and <40 for men

## 2015-04-05 NOTE — Progress Notes (Signed)
[facility] Clinic Note - Initial Visit   Date: 04/05/2015  Jamie Aguirre MRN: [id number] DOB: 1946-08-08   Dear Dr. Doug Sou:  Thank you for your kind referral of Jamie Aguirre for consultation of paresthesias. Although her history is well known to you, please allow [place] to reiterate it for the purpose of our medical record. The patient was accompanied to the clinic by friend who also provides collateral information.     History of Present Illness: Jamie Aguirre is a 69 y.o. right-handed Caucasian female with asthma, diabetes mellitus (HbA1c 7.2), left PCA infarct (2007), hypertension, hyperlipidemia, and CKD  presenting for evaluation of intermittent paresthesias.    In July 2016, she developed 30-min of left jaw, left elbow and right foot numbness.  Patient denies any symptoms now and does not feel that she needs to be evaluated. There was no associated headaches, limb weakness, changes in speech/swallowing.  Her blood pressure was noted to be mildly elevated and her metoprolol was adjusted. She takes daily plavix 92m and aspirin 839mdaily.  She has not had any further spells.   She used to have a history of cluster and tension headaches, but this has significantly improved since menopause. Headaches are very mild and infrequent. She does not take any medications for them anymore.   She had a stroke in 2007 manifested with right sided hemiplegia, symptoms resolved within 7 hours. The second event, she had a severe headache, dizziness, and developed vision changes which resolved after several months.  Imaging showed L PCA occlusion and infract over the left temporal region.  She denies any ongoing problems with language or vision.    Out-side paper records, electronic medical record, and images have been reviewed where available and summarized as:  Labs 4/14//2016:  HbA1c 7.2, vitamin B12 300, LDL 50, HDL 40, Chol 129, TG 196 Labs 05/30/2014:  TSH 4.95  MRI/A  Brain 07/18/2006: 1. Acute stroke in the posteromedial temporal lobe on the left.  2. Chronic small vessel changes, essentially the same as were seen in February of last year.   1. Essentially negative MR angiography of the neck vessels. No significant carotid bifurcation pathology on either side.  2. Mild narrowing of the right vertebral artery origin, difficult to precisely measure given technical limitations in the chest. I doubt that this is a hemodynamically stenosis.   MRA carotids 07/15/2006: 1. No flow is seen in the distal left PCA. This is either occluded or markedly stenotic. Right PCA is widely patent.  2. No significant anterior circulation finding.   Past Medical History  Diagnosis Date  . Asthma   . Arthritis   . Type 2 diabetes mellitus   . Diverticulitis   . Chronic bronchitis   . Headaches, cluster   . Allergic rhinitis, seasonal     assocaited with vertigo sensation  . Chronic kidney disease   . Hypertension   . Hyperlipidemia   . Stroke 09/2005, 1210/6269  L PCA embolic  . Pellucid marginal degeneration of cornea   . Hives     idiopathic    Past Surgical History  Procedure Laterality Date  . Cataract extraction w/ intraocular lens implant Left 02/04/13    Dr GiSusa Simmonds   Medications:  Outpatient Encounter Prescriptions as of 04/05/2015  Medication Sig Note  . albuterol (VENTOLIN HFA) 108 (90 BASE) MCG/ACT inhaler Inhale 2 puffs into the lungs every 6 (six) hours as needed.   . Marland Kitchenspirin 81 MG tablet Take 40.5  mg by mouth every other day.    Marland Kitchen atorvastatin (LIPITOR) 20 MG tablet TAKE 1 TABLET BY MOUTH EVERY DAY   . benzonatate (TESSALON) 200 MG capsule TAKE ONE CAPSULE BY MOUTH 3 TIMES A DAY AS NEEDED FOR COUGH   . blood glucose meter kit and supplies Dispense based on patient and insurance preference. Use up to four times daily as directed. (ICD-10: E011.9).   Marland Kitchen clopidogrel (PLAVIX) 75 MG tablet TAKE 1 TABLET BY MOUTH ONCE DAILY   . diazepam  (VALIUM) 5 MG tablet Take 0.5-1 tablets (2.5-5 mg total) by mouth every 12 (twelve) hours as needed (vertigo symptoms).   . diphenhydrAMINE (BENADRYL) 25 MG tablet Take 25 mg by mouth at bedtime.   . gabapentin (NEURONTIN) 100 MG capsule One pill every eight hours as needed   . glucose blood test strip Use test strips to collect blood glucose sample. Use up to 4 times a day. (ICD-10: E11.09)   . Lancets (ACCU-CHEK SOFT TOUCH) lancets Use as instructed to collect blood glucose sample. Use up to 4 times a day.  (ICD-10: E11.09)   . Linagliptin-Metformin HCl (JENTADUETO) 2.12-998 MG TABS Take 1 tablet by mouth 2 (two) times daily.   Marland Kitchen loratadine (CLARITIN) 10 MG tablet Take 10 mg by mouth every morning.   Marland Kitchen losartan (COZAAR) 100 MG tablet Take 1 tablet (100 mg total) by mouth daily.   . metoprolol succinate (TOPROL-XL) 100 MG 24 hr tablet Take 1 tablet (100 mg total) by mouth 2 (two) times daily.   . silver sulfADIAZINE (SILVADENE) 1 % cream Apply 1 application topically as needed.  08/27/2014: Received from: External Pharmacy  . triamcinolone cream (KENALOG) 0.1 % Apply 1 application topically as needed.  04/16/2014: Received from: External Pharmacy  . cyclobenzaprine (FLEXERIL) 5 MG tablet Take 1 tablet (5 mg total) by mouth every 8 (eight) hours as needed for muscle spasms.   . [DISCONTINUED] nitrofurantoin, macrocrystal-monohydrate, (MACROBID) 100 MG capsule Take 100 mg by mouth 2 (two) times daily. 04/05/2015: Received from: External Pharmacy  . [DISCONTINUED] predniSONE (STERAPRED UNI-PAK 21 TAB) 5 MG (21) TBPK tablet  04/05/2015: Received from: External Pharmacy   No facility-administered encounter medications on file as of 04/05/2015.     Allergies:  Allergies  Allergen Reactions  . Ciprofloxacin Other (See Comments)    Vertigo SE  . Codeine     Codeine family  . Penicillins   . Sulfa Antibiotics     Family History: Family History  Problem Relation Age of Onset  . Arthritis Other   .  Stroke Other   . Hypertension Other   . Hyperlipidemia Other   . Diabetes Other   . Heart disease Father     Social History: Social History  Substance Use Topics  . Smoking status: Former Smoker -- 3.50 packs/day for 20 years    Types: Cigarettes    Quit date: 08/15/1975  . Smokeless tobacco: Never Used  . Alcohol Use: No   Social History   Social History Narrative   Single, lives alone   Previous caregiver for mom who passed in 2006   Retired - clerical work with [employer]    Review of Systems:  CONSTITUTIONAL: No fevers, chills, night sweats, or weight loss.   EYES: No visual changes or eye pain ENT: +hearing changes.  No history of nose bleeds.   RESPIRATORY: No cough, wheezing and shortness of breath.   CARDIOVASCULAR: Negative for chest pain, and palpitations.   GI: Negative for abdominal  discomfort, blood in stools or black stools.  No recent change in bowel habits.   GU:  No history of incontinence.   MUSCLOSKELETAL: No history of joint pain +swelling.  No myalgias.   SKIN: Negative for lesions, rash, and itching.   HEMATOLOGY/ONCOLOGY: Negative for prolonged bleeding, bruising easily, and swollen nodes.  No history of cancer.   ENDOCRINE: Negative for cold or heat intolerance, polydipsia or goiter.   PSYCH:  No depression or anxiety symptoms.   NEURO: As Above.   Vital Signs:  BP 130/80 mmHg  Pulse 64  Ht _0  (1.626 m)  Wt 232 lb 4 oz (105.348 kg)  BMI 39.85 kg/m2  SpO2 98%   General Medical Exam:   General:  Well appearing, comfortable.   Eyes/ENT: see cranial nerve examination.   Neck: No masses appreciated.  Full range of motion without tenderness.  No carotid bruits. Respiratory:  Clear to auscultation, good air entry bilaterally.   Cardiac:  Regular rate and rhythm, no murmur.   Extremities:  No deformities, + right ankle edema  Skin:  No rashes or lesions.  Neurological Exam: MENTAL STATUS including orientation to time, place, person, recent  and remote memory, attention span and concentration, language, and fund of knowledge is normal.  Speech is not dysarthric.  CRANIAL NERVES: II:  No visual field defects.  Unremarkable fundi.   III-IV-VI: Pupils equal round and reactive to light.  Normal conjugate, extra-ocular eye movements in all directions of gaze.  No nystagmus.  No ptosis.   V:  Normal facial sensation.   VII:  Normal facial symmetry and movements.  No pathologic facial reflexes.  VIII:  Normal hearing and vestibular function.   IX-X:  Normal palatal movement.   XI:  Normal shoulder shrug and head rotation.   XII:  Normal tongue strength and range of motion, no deviation or fasciculation.  MOTOR:  No atrophy, fasciculations or abnormal movements.  No pronator drift.  Tone is normal.    Right Upper Extremity:    Left Upper Extremity:    Deltoid  5/5   Deltoid  5/5   Biceps  5/5   Biceps  5/5   Triceps  5/5   Triceps  5/5   Wrist extensors  5/5   Wrist extensors  5/5   Wrist flexors  5/5   Wrist flexors  5/5   Finger extensors  5/5   Finger extensors  5/5   Finger flexors  5/5   Finger flexors  5/5   Dorsal interossei  5/5   Dorsal interossei  5/5   Abductor pollicis  5/5   Abductor pollicis  5/5   Tone (Ashworth scale)  0  Tone (Ashworth scale)  0   Right Lower Extremity:    Left Lower Extremity:    Hip flexors  5/5   Hip flexors  5/5   Hip extensors  5/5   Hip extensors  5/5   Knee flexors  5/5   Knee flexors  5/5   Knee extensors  5/5   Knee extensors  5/5   Dorsiflexors  5/5   Dorsiflexors  5/5   Plantarflexors  5/5   Plantarflexors  5/5   Toe extensors  5/5   Toe extensors  5/5   Toe flexors  5/5   Toe flexors  5/5   Tone (Ashworth scale)  0  Tone (Ashworth scale)  0   MSRs:  Right  Left brachioradialis 2+  brachioradialis 2+  biceps 2+  biceps 2+  triceps 2+  triceps 2+  patellar 2+  patellar 2+  ankle jerk 1+  ankle jerk 1+  Hoffman no   Hoffman no  plantar response down  plantar response down   SENSORY:  Normal and symmetric perception of light touch, pinprick, vibration, and proprioception.  Romberg's sign absent.   COORDINATION/GAIT: Normal finger-to- nose-finger.  Intact rapid alternating movements bilaterally. Gait is slightly wide based due to body habitus.  She is unsteady with tandem and stressed gait.    IMPRESSION: Transient neurological deficits manifesting with left jaw and elbow paresthesias and right foot paresthesias.  Spell lasted 30-minutes and improved spontaneously.  There were no other associated neurological symptoms.  Exam today is non-focal.  The symptomology does not appear to sound like TIA because she has bilateral symptoms.  We discussed her stroke risk factors and secondary prevention including tight glycemic control and starting an exercise program.  Her cholesterol and hypertension is well controlled.  She is already on dual antiplatelet therapy with aspirin 33m and plavix 742mdaily.   For completeness, will check [place].   If she develops new spells, she will need cardiac monitoring and CT/A head and TSH Counseled on stroke prevention as well as stroke warning signs   Return to clinic as needed   The duration of this appointment visit was 35 minutes of face-to-face time with the patient.  Greater than 50% of this time was spent in counseling, explanation of diagnosis, planning of further management, and coordination of care.   Thank you for allowing me to participate in patient's care.  If I can answer any additional questions, I would be pleased to do so.    Sincerely,    Donika K. PaPosey ProntoDO

## 2015-04-07 ENCOUNTER — Ambulatory Visit (HOSPITAL_COMMUNITY)
Admission: RE | Admit: 2015-04-07 | Discharge: 2015-04-07 | Disposition: A | Discharge status: Home / Self Care | Payer: Medicare Other | Source: Ambulatory Visit | Attending: Cardiology | Admitting: Cardiology

## 2015-04-07 DIAGNOSIS — I10 Essential (primary) hypertension: Secondary | ICD-10-CM | POA: Insufficient documentation

## 2015-04-07 DIAGNOSIS — R42 Dizziness and giddiness: Secondary | ICD-10-CM | POA: Insufficient documentation

## 2015-04-07 DIAGNOSIS — R29818 Other symptoms and signs involving the nervous system: Secondary | ICD-10-CM | POA: Diagnosis not present

## 2015-04-07 DIAGNOSIS — I6523 Occlusion and stenosis of bilateral carotid arteries: Secondary | ICD-10-CM | POA: Diagnosis not present

## 2015-04-07 DIAGNOSIS — E785 Hyperlipidemia, unspecified: Secondary | ICD-10-CM | POA: Diagnosis not present

## 2015-04-07 DIAGNOSIS — E119 Type 2 diabetes mellitus without complications: Secondary | ICD-10-CM | POA: Insufficient documentation

## 2015-04-12 ENCOUNTER — Ambulatory Visit (INDEPENDENT_AMBULATORY_CARE_PROVIDER_SITE_OTHER): Payer: Medicare Other | Admitting: Internal Medicine

## 2015-04-12 ENCOUNTER — Other Ambulatory Visit (INDEPENDENT_AMBULATORY_CARE_PROVIDER_SITE_OTHER): Payer: Medicare Other

## 2015-04-12 ENCOUNTER — Encounter: Payer: Self-pay | Admitting: *Deleted

## 2015-04-12 ENCOUNTER — Encounter: Payer: Self-pay | Admitting: Internal Medicine

## 2015-04-12 VITALS — BP 160/100 | HR 63 | Temp 98.4°F | Resp 16 | Ht 64.0 in | Wt 234.1 lb

## 2015-04-12 DIAGNOSIS — E119 Type 2 diabetes mellitus without complications: Secondary | ICD-10-CM

## 2015-04-12 DIAGNOSIS — E559 Vitamin D deficiency, unspecified: Secondary | ICD-10-CM

## 2015-04-12 DIAGNOSIS — E785 Hyperlipidemia, unspecified: Secondary | ICD-10-CM | POA: Diagnosis not present

## 2015-04-12 DIAGNOSIS — I872 Venous insufficiency (chronic) (peripheral): Secondary | ICD-10-CM | POA: Insufficient documentation

## 2015-04-12 DIAGNOSIS — I10 Essential (primary) hypertension: Secondary | ICD-10-CM

## 2015-04-12 DIAGNOSIS — K529 Noninfective gastroenteritis and colitis, unspecified: Secondary | ICD-10-CM | POA: Diagnosis not present

## 2015-04-12 LAB — HEMOGLOBIN A1C: HEMOGLOBIN A1C: 7.2 % — AB (ref 4.6–6.5)

## 2015-04-12 LAB — COMPREHENSIVE METABOLIC PANEL
ALT: 17 U/L (ref 0–35)
AST: 16 U/L (ref 0–37)
Albumin: 4.6 g/dL (ref 3.5–5.2)
Alkaline Phosphatase: 38 U/L — ABNORMAL LOW (ref 39–117)
BILIRUBIN TOTAL: 0.5 mg/dL (ref 0.2–1.2)
BUN: 22 mg/dL (ref 6–23)
CHLORIDE: 101 meq/L (ref 96–112)
CO2: 27 meq/L (ref 19–32)
CREATININE: 1.13 mg/dL (ref 0.40–1.20)
Calcium: 9.4 mg/dL (ref 8.4–10.5)
GFR: 50.92 mL/min — ABNORMAL LOW (ref >60.00)
GLUCOSE: 171 mg/dL — AB (ref 70–99)
Potassium: 4.6 mEq/L (ref 3.5–5.1)
SODIUM: 138 meq/L (ref 135–145)
Total Protein: 7.8 g/dL (ref 6.0–8.3)

## 2015-04-12 LAB — VITAMIN D 25 HYDROXY (VIT D DEFICIENCY, FRACTURES): VITD: 42.17 ng/mL (ref 30.00–100.00)

## 2015-04-12 LAB — TSH: TSH: 4.49 u[IU]/mL (ref 0.35–4.50)

## 2015-04-12 NOTE — Progress Notes (Signed)
   Subjective:    Patient ID: Jamie Aguirre, female    DOB: May 04, 1946, 68 y.o.   MRN: [id number]  HPI The patient is a 69 YO female who is coming in for follow up of her diabetes as well as several acute concerns. She is still taking her medicines and doing okay with her diabetes. Some numbness and tingling in her feet which is stable. Not taking her gabapentin right now. Diet is not good and she is not exercising lately. Has been struggling with the sugars for many years.  New concerns including swelling in her feet with activity, resolves by the morning. Gets worse throughout the day. Hurts when they are swollen. Does not go up into her calves. Keeps her from doing activities in the evening. Has not tried anything for it.  Next concern is her chronic diarrhea that comes once or twice a week. She does not know what causes it and is not sure about what foods correlate with the symptoms.No blood in the stool and no weight loss.   Review of Systems  Constitutional: Negative for diaphoresis, activity change, appetite change and fatigue.  Eyes: Positive for visual disturbance.  Respiratory: Negative for cough, chest tightness, shortness of breath and wheezing.   Cardiovascular: Positive for leg swelling. Negative for chest pain and palpitations.  Gastrointestinal: Negative for abdominal pain, constipation and abdominal distention.  Musculoskeletal: Positive for arthralgias. Negative for myalgias, back pain and gait problem.  Neurological: Positive for numbness. Negative for dizziness, speech difficulty, weakness, light-headedness and headaches.  Psychiatric/Behavioral: Positive for decreased concentration. Negative for behavioral problems, sleep disturbance and dysphoric mood.      Objective:   Physical Exam  Constitutional: She is oriented to person, place, and time. She appears well-developed and well-nourished.  HENT:  Head: Normocephalic and atraumatic.  Mouth/Throat: Oropharynx is clear and  moist.  Eyes: EOM are normal.  Neck: Normal range of motion.  Cardiovascular: Normal rate and regular rhythm.   Pulmonary/Chest: Effort normal and breath sounds normal. No respiratory distress. She has no wheezes.  Abdominal: Soft. Bowel sounds are normal. She exhibits no distension. There is no tenderness. There is no rebound.  Musculoskeletal:  1+ ankle edema, no calf swelling or tenderness.   Neurological: She is alert and oriented to person, place, and time. Coordination normal.  Skin: Skin is warm and dry.   Filed Vitals:   04/12/15 0818  BP: 160/100  Pulse: 63  Temp: 98.4 F (36.9 C)  TempSrc: Oral  Resp: 16  Height:  (1.626 m)  Weight: 234 lb 1.9 oz (106.196 kg)  SpO2: 98%      Assessment & Plan:

## 2015-04-12 NOTE — Assessment & Plan Note (Signed)
Last HgA1c at goal (goal <7.5) 7.2. Rechecking today HgA1c and CMP. Continue on jentadueto and losartan. No having any side effects. Some neuropathy in her legs which is stable. Not exercising and diet is not great for her diabetes. She does not want nutritional counseling at this time.

## 2015-04-12 NOTE — Assessment & Plan Note (Signed)
Rx for compression stockings and talked to her about elevating her legs. No signs of DVT or calf swelling or tenderness.

## 2015-04-12 NOTE — Assessment & Plan Note (Signed)
Diarrhea is stable and without weight loss or indication for further evaluation. Have asked her to keep food diary to help isolate if there is a dietary component to the diarrhea. She is not sure she can do that.

## 2015-04-12 NOTE — Assessment & Plan Note (Signed)
Losartan and metoprolol-xl still taking and BP moderately elevated today. Not high in the past and will adjust at next visit if no improvement. She will monitor at home and if still high call the office.

## 2015-04-12 NOTE — Patient Instructions (Signed)
We have given you a prescription for the compression stockings in case you want to try them. Another idea for the swelling is to work on keeping the legs propped during the day when you have downtime.   Think about keeping a food diary for the diarrhea episodes to see if we can link it with a cause.   We are checking your regular labs today as well as a couple extra today and will call you back with the results.  If you do not get the carotid results in 1 week call our office and we can send you a copy.

## 2015-04-12 NOTE — Progress Notes (Signed)
Pre visit review using our clinic review tool, if applicable. No additional management support is needed unless otherwise documented below in the visit note. 

## 2015-05-03 ENCOUNTER — Telehealth: Payer: Self-pay | Admitting: Internal Medicine

## 2015-05-03 NOTE — Telephone Encounter (Signed)
Pt would like to know what she needs to do/who she needs to see if anyone based on the pulmonary function test done in June.  Please call pt to let her know.

## 2015-05-03 NOTE — Telephone Encounter (Signed)
Tried to reach patient, no answer and no voice mail. Per Dr. Lavonna Monarch note, she can discuss at next ov, or she can be sent to a lung specialist. Whichever she prefers.

## 2015-06-03 ENCOUNTER — Ambulatory Visit (INDEPENDENT_AMBULATORY_CARE_PROVIDER_SITE_OTHER): Payer: Medicare Other

## 2015-06-03 DIAGNOSIS — Z23 Encounter for immunization: Secondary | ICD-10-CM | POA: Diagnosis not present

## 2015-07-06 ENCOUNTER — Ambulatory Visit (INDEPENDENT_AMBULATORY_CARE_PROVIDER_SITE_OTHER): Payer: Medicare Other | Admitting: Internal Medicine

## 2015-07-06 VITALS — BP 154/94 | HR 78 | Temp 97.4°F | Resp 17 | Ht 63.5 in | Wt 240.0 lb

## 2015-07-06 DIAGNOSIS — S61206A Unspecified open wound of right little finger without damage to nail, initial encounter: Secondary | ICD-10-CM | POA: Diagnosis not present

## 2015-07-06 DIAGNOSIS — S61214A Laceration without foreign body of right ring finger without damage to nail, initial encounter: Secondary | ICD-10-CM | POA: Diagnosis not present

## 2015-07-06 DIAGNOSIS — Z7901 Long term (current) use of anticoagulants: Secondary | ICD-10-CM

## 2015-07-06 DIAGNOSIS — S61219A Laceration without foreign body of unspecified finger without damage to nail, initial encounter: Secondary | ICD-10-CM

## 2015-07-06 NOTE — Progress Notes (Signed)
Subjective:  This chart was scribed for Jamie Lin, MD by Moises Blood, Medical Scribe. This patient was seen in Room 7 and the patient's care was started at 7:28 PM.     Patient ID: Jamie Aguirre, female    DOB: 1946/05/14, 69 y.o.   MRN: [id number] Chief Complaint  Patient presents with  . Finger Injury    right side     HPI Jamie Aguirre is a 69 y.o. female who presents to [facility] complaining of a right ring finger laceration. She was cutting something at home and accidentally sliced her finger with a serrated knife. Her friend wrapped it up really tight cause bleeding so much.  Her last tdap was done this year.   Patient Active Problem List   Diagnosis Date Noted  . Chronic venous insufficiency 04/12/2015  . Routine general medical examination at a health care facility 08/27/2014  . Encounter for medication counseling 07/01/2014  . Chronic diarrhea of unknown origin 05/15/2014  . Arthritis of right ankle 07/08/2013  . Depressive disorder 10/09/2012  . Vertigo   . OSA (obstructive sleep apnea)   . Obese 01/01/2012  . Type 2 diabetes mellitus ([place])   . Hypertension   . Hyperlipidemia   . Stroke ([place])   . Allergic rhinitis, seasonal     Current outpatient prescriptions:  .  aspirin 81 MG tablet, Take 81 mg by mouth daily. , Disp: , Rfl:  .  atorvastatin (LIPITOR) 20 MG tablet, TAKE 1 TABLET BY MOUTH EVERY DAY, Disp: 90 tablet, Rfl: 1 .  blood glucose meter kit and supplies, Dispense based on patient and insurance preference. Use up to four times daily as directed. (ICD-10: E011.9)., Disp: 1 each, Rfl: 0 .  clopidogrel (PLAVIX) 75 MG tablet, TAKE 1 TABLET BY MOUTH ONCE DAILY, Disp: 90 tablet, Rfl: 5 .  diphenhydrAMINE (BENADRYL) 25 MG tablet, Take 25 mg by mouth at bedtime., Disp: , Rfl:  .  glucose blood test strip, Use test strips to collect blood glucose sample. Use up to 4 times a day. (ICD-10: E11.09), Disp: 150 each, Rfl: 11 .  Linagliptin-Metformin HCl  (JENTADUETO) 2.12-998 MG TABS, Take 1 tablet by mouth 2 (two) times daily., Disp: 120 tablet, Rfl: 0 .  loratadine (CLARITIN) 10 MG tablet, Take 10 mg by mouth every morning., Disp: , Rfl:  .  losartan (COZAAR) 100 MG tablet, Take 1 tablet (100 mg total) by mouth daily., Disp: 90 tablet, Rfl: 3 .  metoprolol succinate (TOPROL-XL) 100 MG 24 hr tablet, Take 1 tablet (100 mg total) by mouth 2 (two) times daily., Disp: 180 tablet, Rfl: 1 .  albuterol (VENTOLIN HFA) 108 (90 BASE) MCG/ACT inhaler, Inhale 2 puffs into the lungs every 6 (six) hours as needed. (Patient not taking: Reported on 07/06/2015), Disp: 18 g, Rfl: 5 .  cyclobenzaprine (FLEXERIL) 5 MG tablet, Take 1 tablet (5 mg total) by mouth every 8 (eight) hours as needed for muscle spasms., Disp: 30 tablet, Rfl: 1 .  diazepam (VALIUM) 5 MG tablet, Take 0.5-1 tablets (2.5-5 mg total) by mouth every 12 (twelve) hours as needed (vertigo symptoms). (Patient not taking: Reported on 07/06/2015), Disp: 20 tablet, Rfl: 0 .  gabapentin (NEURONTIN) 100 MG capsule, One pill every eight hours as needed (Patient not taking: Reported on 04/12/2015), Disp: 30 capsule, Rfl: 2 .  Lancets (ACCU-CHEK SOFT TOUCH) lancets, Use as instructed to collect blood glucose sample. Use up to 4 times a day.  (ICD-10: E11.09) (Patient not taking: Reported on  07/06/2015), Disp: 200 each, Rfl: 12 .  silver sulfADIAZINE (SILVADENE) 1 % cream, Apply 1 application topically as needed. , Disp: , Rfl:  .  triamcinolone cream (KENALOG) 0.1 %, Apply 1 application topically as needed. , Disp: , Rfl:     Review of Systems  Constitutional: Negative for fatigue and unexpected weight change.  Respiratory: Negative for chest tightness and shortness of breath.   Cardiovascular: Negative for chest pain, palpitations and leg swelling.  Gastrointestinal: Negative for abdominal pain and blood in stool.  Skin: Positive for wound (right ring finger).  Neurological: Negative for dizziness, syncope,  light-headedness and headaches.       Objective:   Physical Exam  Constitutional: She is oriented to person, place, and time. She appears well-developed and well-nourished. No distress.  HENT:  Head: Normocephalic and atraumatic.  Eyes: EOM are normal. Pupils are equal, round, and reactive to light.  Neck: Neck supple.  Cardiovascular: Normal rate.   Pulmonary/Chest: Effort normal. No respiratory distress.  Musculoskeletal: Normal range of motion.  4/5 fingers with full rom  Neurological: She is alert and oriented to person, place, and time.  Skin: Skin is warm and dry.  right 4th finger pad is a 1cm laceration, also a puncture wound on right 5th finger pad  Psychiatric: She has a normal mood and affect. Her behavior is normal.  Nursing note and vitals reviewed. no bruising  BP 154/94 mmHg  Pulse 78  Temp(Src) 97.4 F (36.3 C) (Oral)  Resp 17  Ht 5' 3.5" (1.613 m)  Wt 240 lb (108.863 kg)  BMI 41.84 kg/m2  SpO2 97%   Sutures per PA Clark    Assessment & Plan:  Laceration of finger of right hand, initial encounter  Long term current use of anticoagulant therapy  Wound care SR 10d  By signing my name below, I, Moises Blood, attest that this documentation has been prepared under the direction and in the presence of Jamie Lin, MD. Electronically Signed: Moises Blood, Scribe. 07/06/2015 , 7:29 PM .  I have completed the patient encounter in its entirety as documented by the scribe, with editing by me where necessary. Mekaela Azizi P. Laney Pastor, M.D.

## 2015-07-13 ENCOUNTER — Encounter: Payer: Self-pay | Admitting: Internal Medicine

## 2015-07-13 ENCOUNTER — Ambulatory Visit (INDEPENDENT_AMBULATORY_CARE_PROVIDER_SITE_OTHER): Payer: Medicare Other | Admitting: Internal Medicine

## 2015-07-13 ENCOUNTER — Other Ambulatory Visit (INDEPENDENT_AMBULATORY_CARE_PROVIDER_SITE_OTHER): Payer: Medicare Other

## 2015-07-13 VITALS — BP 140/88 | HR 66 | Temp 98.0°F | Resp 16 | Ht 62.0 in | Wt 236.0 lb

## 2015-07-13 DIAGNOSIS — E119 Type 2 diabetes mellitus without complications: Secondary | ICD-10-CM

## 2015-07-13 DIAGNOSIS — E785 Hyperlipidemia, unspecified: Secondary | ICD-10-CM | POA: Diagnosis not present

## 2015-07-13 DIAGNOSIS — I10 Essential (primary) hypertension: Secondary | ICD-10-CM | POA: Diagnosis not present

## 2015-07-13 LAB — HEMOGLOBIN A1C: HEMOGLOBIN A1C: 7.6 % — AB (ref 4.6–6.5)

## 2015-07-13 LAB — SEDIMENTATION RATE: Sed Rate: 22 mm/hr (ref 0–22)

## 2015-07-13 MED ORDER — CYCLOBENZAPRINE HCL 5 MG PO TABS: 5.0000 mg | ORAL_TABLET | Freq: Three times a day (TID) | ORAL | Status: DC | PRN

## 2015-07-13 NOTE — Patient Instructions (Signed)
We will check the labs today for the headaches and the diabetes.   We are not changing the medicines today.   Come back in about 3-6 months for a check up.   If the headaches are not better in 2-3 weeks call us back for advice.

## 2015-07-13 NOTE — Assessment & Plan Note (Signed)
Checking HgA1c, she has not been doing great with diet recently. Still taking jentadueto and losartan. She is getting eye exam next week. Pneumonia series completed. Mild neuropathy which is stable.

## 2015-07-13 NOTE — Progress Notes (Signed)
Pre visit review using our clinic review tool, if applicable. No additional management support is needed unless otherwise documented below in the visit note. 

## 2015-07-13 NOTE — Progress Notes (Signed)
   Subjective:    Patient ID: Jamie KaufmannLinda Aguirre, female    DOB: [birth year], 69 y.o.   MRN: [id number]  HPI The patient is a 69 YO female coming in for follow up of her medical conditions including: diabetes (still taking her jentadueto and losartan, mild neuropathy which is stable, no hypoglycemia), her blood pressure (in some pain today, taking losartan and metoprolol, no known complications), and her cholesterol (taking lipitor daily, recent lipid panel at goal, no side effects). She is working with an alternative healer and doing well. No more vertigo. Some pains are gone. Headaches have started since she started this.   Review of Systems  Constitutional: Negative for diaphoresis, activity change, appetite change and fatigue.  HENT: Negative.   Respiratory: Negative for cough, chest tightness, shortness of breath and wheezing.   Cardiovascular: Positive for leg swelling. Negative for chest pain and palpitations.  Gastrointestinal: Negative for abdominal pain, constipation and abdominal distention.  Musculoskeletal: Positive for arthralgias. Negative for myalgias, back pain and gait problem.  Neurological: Positive for numbness. Negative for dizziness, speech difficulty, weakness, light-headedness and headaches.  Psychiatric/Behavioral: Positive for decreased concentration. Negative for behavioral problems, sleep disturbance and dysphoric mood.      Objective:   Physical Exam  Constitutional: She is oriented to person, place, and time. She appears well-developed and well-nourished.  HENT:  Head: Normocephalic and atraumatic.  Mouth/Throat: Oropharynx is clear and moist.  Eyes: EOM are normal.  Neck: Normal range of motion.  Cardiovascular: Normal rate and regular rhythm.   Pulmonary/Chest: Effort normal and breath sounds normal. No respiratory distress. She has no wheezes.  Abdominal: Soft. Bowel sounds are normal. She exhibits no distension. There is no tenderness. There is no rebound.    Neurological: She is alert and oriented to person, place, and time. Coordination normal.  Skin: Skin is warm and dry.  Looked at stitches on right hand 3rd finger and appear to be healing well, no infection.    Filed Vitals:   07/13/15 0823 07/13/15 0948  BP: 148/90 140/88  Pulse: 66   Temp: 98 F (36.7 C)   TempSrc: Oral   Resp: 16   Height: 5\' 2"  (1.575 m)   Weight: 236 lb (107.049 kg)   SpO2: 98%       Assessment & Plan:

## 2015-07-13 NOTE — Assessment & Plan Note (Signed)
She is on lipitor daily without side effects and recent lipid panel at goal.

## 2015-07-13 NOTE — Assessment & Plan Note (Signed)
Taking losartan and metoprolol for her blood pressure which is at goal. No known complications. Reviewed recent labs today and adjust as needed.

## 2015-07-20 ENCOUNTER — Ambulatory Visit (INDEPENDENT_AMBULATORY_CARE_PROVIDER_SITE_OTHER): Payer: Medicare Other | Admitting: Physician Assistant

## 2015-07-20 VITALS — BP 170/92 | HR 71 | Temp 98.6°F | Resp 20

## 2015-07-20 DIAGNOSIS — I10 Essential (primary) hypertension: Secondary | ICD-10-CM

## 2015-07-20 DIAGNOSIS — Z4802 Encounter for removal of sutures: Secondary | ICD-10-CM

## 2015-07-20 DIAGNOSIS — S61219D Laceration without foreign body of unspecified finger without damage to nail, subsequent encounter: Secondary | ICD-10-CM

## 2015-07-20 NOTE — Progress Notes (Signed)
[facility] [address] [place] [address] [phone]  Date:  07/20/2015   Name:  Jamie Aguirre   DOB:  Apr 28, 1946   MRN:  [id number]  PCP:  Hoyt Koch, MD    Chief Complaint: Suture / Staple Removal   History of Present Illness:  This is a 68 y.o. female with PMH DM2, CVA, HTN who is presenting for suture removal. She was seen here 11/22 with laceration of right 4th digit. Repaired with #4 simple sutures. She is reporting continued pain over the laceration. No redness, swelling or drainage.  Pt has elevated BP 162/88. She saw her PCP Dr. Pricilla Holm on 11/29 and BP was 140/88. She states her BP increases when she is at [employer] and when she is nervous. She states she is very nervous about getting her sutures out. She has a BP monitor at home but doesn't use it often. She is asymptomatic.  Review of Systems:  Review of Systems See HPI  Patient Active Problem List   Diagnosis Date Noted  . Chronic venous insufficiency 04/12/2015  . Routine general medical examination at a health care facility 08/27/2014  . Encounter for medication counseling 07/01/2014  . Chronic diarrhea of unknown origin 05/15/2014  . Arthritis of right ankle 07/08/2013  . Depressive disorder 10/09/2012  . Vertigo   . OSA (obstructive sleep apnea)   . Obese 01/01/2012  . Type 2 diabetes mellitus ([place])   . Hypertension   . Hyperlipidemia   . Stroke ([place])   . Allergic rhinitis, seasonal     Prior to Admission medications   Medication Sig Start Date End Date Taking? Authorizing Provider  albuterol (VENTOLIN HFA) 108 (90 BASE) MCG/ACT inhaler Inhale 2 puffs into the lungs every 6 (six) hours as needed. 08/17/14  Yes Hoyt Koch, MD  aspirin 81 MG tablet Take 81 mg by mouth daily.    Yes Historical Provider, MD  atorvastatin (LIPITOR) 20 MG tablet TAKE 1 TABLET BY MOUTH EVERY DAY 03/18/15  Yes Hoyt Koch, MD  blood glucose meter kit and  supplies Dispense based on patient and insurance preference. Use up to four times daily as directed. (ICD-10: E011.9). 10/09/14  Yes Hoyt Koch, MD  clopidogrel (PLAVIX) 75 MG tablet TAKE 1 TABLET BY MOUTH ONCE DAILY 07/01/14  Yes Hoyt Koch, MD  diazepam (VALIUM) 5 MG tablet Take 0.5-1 tablets (2.5-5 mg total) by mouth every 12 (twelve) hours as needed (vertigo symptoms). 10/09/12  Yes Rowe Clack, MD  diphenhydrAMINE (BENADRYL) 25 MG tablet Take 25 mg by mouth at bedtime.   Yes Historical Provider, MD  glucose blood test strip Use test strips to collect blood glucose sample. Use up to 4 times a day. (ICD-10: E11.09) 10/09/14  Yes Hoyt Koch, MD  Lancets (ACCU-CHEK SOFT TOUCH) lancets Use as instructed to collect blood glucose sample. Use up to 4 times a day.  (ICD-10: E11.09) 10/09/14  Yes Hoyt Koch, MD  Linagliptin-Metformin HCl (JENTADUETO) 2.12-998 MG TABS Take 1 tablet by mouth 2 (two) times daily. 01/08/15  Yes Hoyt Koch, MD  losartan (COZAAR) 100 MG tablet Take 1 tablet (100 mg total) by mouth daily. 09/23/14  Yes Hoyt Koch, MD  metoprolol succinate (TOPROL-XL) 100 MG 24 hr tablet Take 1 tablet (100 mg total) by mouth 2 (two) times daily. 03/18/15  Yes Hendricks Limes, MD  silver sulfADIAZINE (SILVADENE) 1 % cream Apply 1 application topically as  needed.  08/26/14  Yes Historical Provider, MD  triamcinolone cream (KENALOG) 0.1 % Apply 1 application topically as needed.  01/20/14  Yes Historical Provider, MD       Hoyt Koch, MD       Hendricks Limes, MD       Historical Provider, MD    Allergies  Allergen Reactions  . Ciprofloxacin Other (See Comments)    Vertigo SE  . Codeine     Codeine family  . Latex Dermatitis  . Penicillins   . Sulfa Antibiotics     Past Surgical History  Procedure Laterality Date  . Cataract extraction w/ intraocular lens implant Left 02/04/13    Dr Susa Simmonds    Social History   Substance Use Topics  . Smoking status: Former Smoker -- 3.50 packs/day for 20 years    Types: Cigarettes    Quit date: 08/15/1975  . Smokeless tobacco: Never Used  . Alcohol Use: No    Family History  Problem Relation Age of Onset  . Arthritis Other   . Stroke Other   . Hypertension Other   . Hyperlipidemia Other   . Diabetes Other   . Heart disease Father     Medication list has been reviewed and updated.  Physical Examination:  Physical Exam  Constitutional: She is oriented to person, place, and time. She appears well-developed and well-nourished. No distress.  HENT:  Head: Normocephalic and atraumatic.  Right Ear: Hearing normal.  Left Ear: Hearing normal.  Nose: Nose normal.  Eyes: Conjunctivae and lids are normal. Right eye exhibits no discharge. Left eye exhibits no discharge. No scleral icterus.  Pulmonary/Chest: Effort normal. No respiratory distress.  Musculoskeletal: Normal range of motion.  Neurological: She is alert and oriented to person, place, and time.  Skin: Skin is warm, dry and intact.  1 cm laceration over right 4th digit. Mild tenderness. No erythema, swelling, drainage. #4 sutures removed without problems.  Psychiatric: She has a normal mood and affect. Her speech is normal and behavior is normal. Thought content normal.    BP 162/88 mmHg  Pulse 71  Temp(Src) 98.6 F (37 C) (Oral)  Resp 20  SpO2 98%  Assessment and Plan:  1. Laceration of finger of right hand, subsequent encounter Healed well. Sutures removed.  2. Essential hypertension BP elevated today. Pt was recently seen by PCP on 11/29 for management of chronic conditions. Pt states her BP increases at clinics. She will monitor BP at home and will let PCP know if consistently >150/90.   Benjaman Pott Drenda Freeze, MHS [facility]  07/20/2015

## 2015-07-21 ENCOUNTER — Ambulatory Visit (INDEPENDENT_AMBULATORY_CARE_PROVIDER_SITE_OTHER): Payer: Medicare Other | Admitting: Pulmonary Disease

## 2015-07-21 ENCOUNTER — Encounter: Payer: Self-pay | Admitting: Pulmonary Disease

## 2015-07-21 VITALS — BP 130/84 | HR 78 | Ht 62.0 in | Wt 236.0 lb

## 2015-07-21 DIAGNOSIS — R942 Abnormal results of pulmonary function studies: Secondary | ICD-10-CM | POA: Diagnosis not present

## 2015-07-21 DIAGNOSIS — G4733 Obstructive sleep apnea (adult) (pediatric): Secondary | ICD-10-CM

## 2015-07-21 NOTE — Patient Instructions (Addendum)
CPAP is set at 8 cm & seems to be working well Supplies will be renewed x 1 year  OK to take mucinex 600 mg twice daily when sputum increases in amount  Can take albuterol 2 puffs as needed for wheezing or before unusual exertion

## 2015-07-21 NOTE — Progress Notes (Signed)
   Subjective:    Patient ID: Jamie KaufmannLinda Aguirre, female    DOB: 01-03-1946, 69 y.o.   MRN: [id number]  HPI  Chief Complaint  Patient presents with  . Follow-up    Former [facility] pt following for OSA: pt states she has been doing well, however she was told all the symptoms she is having is " COPD ". pt c/o prod cough consistently mustard in color and for the past week clear. pt c/o SOB and wheezing on exertion. Pt is using a CPAP every night. Mask and pressure working well for pt. DME: [facility]    Former [facility] patient presents for follow-up of OSA She states that she Was diagnosed with "COPD' , had PFTs done She complains of a chronic cough that ranges from yellow to white in color She also reports a long history of asthma She is hardly using albuterol -since she does not know when she should use it. She has numerous other questions regarding her CPAP machine  Denies mask or pressure issues, numerous complaints but DME-she has not changed [employer] and is happy with this company  Significant tests/ events  NPSG 10/2011:  34/hr, pressure titrated to 8cm  PFTs 01/2015 FEV1 59%, ratio 76, improved to 70% with a 17% bronchodilator response, DLCO 69%  Download on CPAP 8 cm 07/2015 >> shows excellent usage, no residuals, no leak  Review of Systems neg for any significant sore throat, dysphagia, itching, sneezing, nasal congestion or excess/ purulent secretions, fever, chills, sweats, unintended wt loss, pleuritic or exertional cp, hempoptysis, orthopnea pnd or change in chronic leg swelling. Also denies presyncope, palpitations, heartburn, abdominal pain, nausea, vomiting, diarrhea or change in bowel or urinary habits, dysuria,hematuria, rash, arthralgias, visual complaints, headache, numbness weakness or ataxia.     Objective:   Physical Exam  Gen. Pleasant, obese, in no distress ENT - no lesions, no post nasal drip Neck: No JVD, no thyromegaly, no carotid bruits Lungs: no use of accessory muscles, no  dullness to percussion, decreased without rales or rhonchi  Cardiovascular: Rhythm regular, heart sounds  normal, no murmurs or gallops, no peripheral edema Musculoskeletal: No deformities, no cyanosis or clubbing , no tremors       Assessment & Plan:

## 2015-07-22 ENCOUNTER — Other Ambulatory Visit: Payer: Self-pay | Admitting: Internal Medicine

## 2015-07-22 DIAGNOSIS — R942 Abnormal results of pulmonary function studies: Secondary | ICD-10-CM | POA: Insufficient documentation

## 2015-07-22 MED ORDER — BACLOFEN 10 MG PO TABS: 10.0000 mg | ORAL_TABLET | Freq: Three times a day (TID) | ORAL | Status: DC | PRN

## 2015-07-22 NOTE — Assessment & Plan Note (Signed)
CPAP is set at 8 cm & seems to be working well Supplies will be renewed x 1 year  Weight loss encouraged, compliance with goal of at least 4-6 hrs every night is the expectation. Advised against medications with sedative side effects Cautioned against driving when sleepy - understanding that sleepiness will vary on a day to day basis

## 2015-07-22 NOTE — Assessment & Plan Note (Signed)
The FEV1 ratio is normal- hence there is no airway obstruction. She does have a restrictive pattern with  decreased DLCO suggesting intraparenchymal cause There is also a bronchodilator response-hence I have asked her to use albuterol on an as-needed basis  We clarify the various situations where she should use albuterol. She will also use Mucinex for sputum production

## 2015-07-24 NOTE — Addendum Note (Signed)
Addended by: Ofilia NeasLARK, MICHAEL L on: 07/24/2015 07:14 PM   Modules accepted: Level of Service

## 2015-07-24 NOTE — Progress Notes (Signed)
Risk and benefits discussed and verbal consent obtained. Anesthetic allergies reviewed. Patient anesthetized using 1:1 mix of 2% lidocaine without epi and Marcaine. The wound was cleansed thoroughly with soap and water. Sterile prep and drape. Wound closed using 4-0 Ethilon suture material. Hemostasis achieved. Mupirocin applied to the wound and bandage placed. The patient tolerated well. Wound instructions were provided and the patient is to return in 10 days for suture removal.

## 2015-08-02 ENCOUNTER — Telehealth: Payer: Self-pay | Admitting: Internal Medicine

## 2015-08-02 NOTE — Telephone Encounter (Signed)
Yes, she is okay to take this. It is very similar to the flexeril she has had in the past.

## 2015-08-02 NOTE — Telephone Encounter (Signed)
This had already been sent in and she can talk to her pharmacy about filling.

## 2015-08-02 NOTE — Telephone Encounter (Signed)
I spoke to patient and she says she does not want to take the baclofen. She will take the flexeril and pay for it herself. Would you send in the flexeril as a replacement?

## 2015-08-02 NOTE — Telephone Encounter (Signed)
Patient was reading the information on baclofen (LIORESAL) 10 MG tablet [[id number] and it read stroke patients shouldn't take this. She has had strokes in the past and is scared to take it. Pharmacy is [facility] on [address]

## 2015-08-02 NOTE — Telephone Encounter (Signed)
Is it ok for patient to take baclofen? Please advise, thanks.

## 2015-08-03 NOTE — Telephone Encounter (Signed)
Patient aware and will call pharmacy

## 2015-08-25 ENCOUNTER — Encounter: Payer: Self-pay | Admitting: Pulmonary Disease

## 2015-08-27 ENCOUNTER — Other Ambulatory Visit: Payer: Self-pay | Admitting: Internal Medicine

## 2015-09-03 ENCOUNTER — Telehealth: Payer: Self-pay | Admitting: Internal Medicine

## 2015-09-03 NOTE — Telephone Encounter (Signed)
Patient called to advise that she told us the incorrect amount of toporol xl that she is taking. She states that she is taking 1.5 pills (one in the am and .5 in the pm).

## 2015-09-06 NOTE — Telephone Encounter (Signed)
Noted  

## 2015-10-05 ENCOUNTER — Ambulatory Visit (INDEPENDENT_AMBULATORY_CARE_PROVIDER_SITE_OTHER): Payer: Medicare Other | Admitting: Family Medicine

## 2015-10-05 ENCOUNTER — Encounter: Payer: Self-pay | Admitting: Family Medicine

## 2015-10-05 VITALS — BP 134/88 | HR 72 | Ht 62.0 in | Wt 238.0 lb

## 2015-10-05 DIAGNOSIS — I872 Venous insufficiency (chronic) (peripheral): Secondary | ICD-10-CM | POA: Diagnosis not present

## 2015-10-05 MED ORDER — FUROSEMIDE 20 MG PO TABS: 20.0000 mg | ORAL_TABLET | Freq: Every day | ORAL | Status: DC

## 2015-10-05 NOTE — Progress Notes (Signed)
Pre visit review using our clinic review tool, if applicable. No additional management support is needed unless otherwise documented below in the visit note. 

## 2015-10-05 NOTE — Patient Instructions (Addendum)
Good to see you  Lasix  daily for next 5 days then as needed to help with the swelling in the legs.   Vitamin D 2000 IU daily Turmeric  daily Tylenol 325 mg 3 times a day scheduled Tart cherry extract at night Continue the good shoes  For diet  When you wake up drink 2 cups of water immediately  Then drink one cup of water before every meal.  Make sure you eat something every 2 hours to keep metabolism up.  Could be nuts, yogurt, something with protein.  Do not eat any carbs after 6pm.   See me again in 4 weeks.

## 2015-10-05 NOTE — Assessment & Plan Note (Signed)
Patient's chronic insufficiency seems to be the reason why patient is having trouble. 2+ pitting edema think is giving her the discomfort. Patient was given the choice of compression stockings been patient declined. Patient was put on a very low dose of Lasix. Warned the potential side effects. I do think that this will be beneficial. We discussed icing regimen. We discussed the importance of weight loss and patient given different recommendations. Patient and will come back and see me again in 4 weeks. His lungs patient is responding and thinks that she will do well. If any worsening symptoms she is to call us sooner.

## 2015-10-05 NOTE — Progress Notes (Signed)
[facility] [address] [place], [place] [address] Phone: [phone] Subjective:    I'm seeing this patient by the request  of:  Myrlene Broker, MD   CC: left calf pain  [id number] Jamie Aguirre is a 70 y.o. female coming in with complaint of left calf pain. Patient states that she has had this pain for quite some time. States that it seems be worsening. Patient states it is a sensation of fullness of the leg. Patient is already on a blood thinner. Patient denies any redness or any type of injury. States that if she does more activity can give her a severe aching sensation. Seems to start at the ankle and radiate up. Patient states in the morning it seems to be better. Patient states when she increases her activity over the course of time and seems to get worse. Patient states that if she puts her feet up it seems to help some of the pain. He is on a low dose gabapentin. Does not remember injuring this side at any point.     Past Medical History  Diagnosis Date  . Asthma   . Arthritis   . Type 2 diabetes mellitus ([facility])   . Diverticulitis   . Chronic bronchitis   . Headaches, cluster   . Allergic rhinitis, seasonal     assocaited with vertigo sensation  . Chronic kidney disease   . Hypertension   . Hyperlipidemia   . 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 Pellucid marginal degeneration of cornea   . Hives     idiopathic   Past Surgical History  Procedure Laterality Date  . Cataract extraction w/ intraocular lens implant Left 02/04/13    Dr Valere Dross   Social History   Social History  . Marital Status: Married    Spouse Name: N/A  . Number of Children: N/A  . Years of Education: N/A   Social History Main Topics  . Smoking status: Former Smoker -- 3.50 packs/day for 20 years    Types: Cigarettes    Quit date: 08/15/1975  . Smokeless tobacco: Never Used  . Alcohol Use: No  . Drug Use: No  . Sexual Activity: Not  Currently   Other Topics Concern  . 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 Ciprofloxacin Other (See Comments)    Vertigo SE  . Codeine     Codeine family  . Latex Dermatitis  . Penicillins   . Sulfa Antibiotics    Family History  Problem Relation Age of Onset  . Arthritis Other   . Stroke Other   . Hypertension Other   . Hyperlipidemia Other   . Diabetes Other   . Heart disease Father     Past medical history, social, surgical and family history all reviewed in electronic medical record.  No pertanent information unless stated regarding to the chief complaint.   Review of Systems: No headache, visual changes, nausea, vomiting, diarrhea, constipation, dizziness, abdominal pain, skin rash, fevers, chills, night sweats, weight loss, swollen lymph nodes, body aches, joint swelling, muscle aches, chest pain, shortness of breath, mood changes.   Objective Blood pressure 134/88, pulse 72, height  (1.575 m), weight 238 lb (107.956 kg), SpO2 90 %.  General: No apparent distress alert and oriented  x3 mood and affect normal, dressed appropriately.  HEENT: Pupils equal, extraocular movements intact  Respiratory: Patient's speak in full sentences and does not appear short of breath  Cardiovascular: 2+ loweron tender, moderate tenderness.  Skin: Warm dry intact with no signs of infection or rash on extremities or on axial skeleton.  Abdomen: Soft nontender  Neuro: Cranial nerves II through XII are intact, neurovascularly intact in all extremities with 2+ DTRs and 2+ pulses.  Lymph: No lymphadenopathy of posterior or anterior cervical chain or axillae bilaterally.  Gait ambulates with the aid of a cane  MSK:  Non tender with full range of motion and good stability and symmetric strength and tone of shoulders, elbows, wrist, hip, knee and ankles  bilaterally. Arthritic changes of multiple joints  Moderate to severe arthritic changes of the ankles bilaterally. Patient does have crepitus. Decreased range of motion and dorsiflexion as well as plantar flexion. Neurovascular intact distally. Moderate to severe tenderness to compression of the calf. 2+ pitting edema. No erythema and no signs of infection.   Impression and Recommendations:     This case required medical decision making of moderate complexity.      Note: This dictation was prepared with Dragon dictation along with smaller phrase technology. Any transcriptional errors that result from this process are unintentional.

## 2015-10-07 ENCOUNTER — Telehealth: Payer: Self-pay | Admitting: Internal Medicine

## 2015-10-07 DIAGNOSIS — E119 Type 2 diabetes mellitus without complications: Secondary | ICD-10-CM

## 2015-10-07 NOTE — Telephone Encounter (Signed)
Pt request to speak to the assistant (will not say why). Please give her a call back  Phone # [phone]

## 2015-10-08 NOTE — Telephone Encounter (Signed)
Patient would like a referral to have inserts made for orthopedic shoes. Can you set this up for her without an appt. Patient says the arches of her feet are really hurting and the left one has collapsed.

## 2015-10-11 NOTE — Telephone Encounter (Signed)
Referral to podiatry  

## 2015-11-08 ENCOUNTER — Ambulatory Visit: Payer: Medicare Other | Admitting: Sports Medicine

## 2015-11-09 ENCOUNTER — Encounter: Payer: Self-pay | Admitting: Family Medicine

## 2015-11-09 ENCOUNTER — Ambulatory Visit (INDEPENDENT_AMBULATORY_CARE_PROVIDER_SITE_OTHER): Payer: Medicare Other | Admitting: Family Medicine

## 2015-11-09 VITALS — BP 142/90 | HR 67 | Ht 62.0 in

## 2015-11-09 DIAGNOSIS — M19072 Primary osteoarthritis, left ankle and foot: Secondary | ICD-10-CM

## 2015-11-09 DIAGNOSIS — E669 Obesity, unspecified: Secondary | ICD-10-CM

## 2015-11-09 DIAGNOSIS — I872 Venous insufficiency (chronic) (peripheral): Secondary | ICD-10-CM | POA: Diagnosis not present

## 2015-11-09 DIAGNOSIS — M129 Arthropathy, unspecified: Secondary | ICD-10-CM | POA: Diagnosis not present

## 2015-11-09 DIAGNOSIS — M19071 Primary osteoarthritis, right ankle and foot: Secondary | ICD-10-CM

## 2015-11-09 NOTE — Assessment & Plan Note (Signed)
Patient is some significant arthritis of the ankles bilaterally. We discussed the importance of proper shoes. We discussed the importance of weight loss. Patient given a trial topical anti-inflammatories. We discussed icing regimen. We discussed the importance of actually using the diuretic to help with the venous insufficiency. Patient does not like using them regularly. Discussed with patient and at this point we'll any other thing we have would be injections or formal physical therapy which patient does not want to do either.

## 2015-11-09 NOTE — Progress Notes (Signed)
Pre visit review using our clinic review tool, if applicable. No additional management support is needed unless otherwise documented below in the visit note. 

## 2015-11-09 NOTE — Progress Notes (Signed)
[facility] [address] [place], [place] [address] Phone: [phone] Subjective:    I'm seeing this patient by the request  of:  Myrlene Broker, MD   CC: Bilateral ankle follow up  [id number] Jamie Aguirre is a 70 y.o. female coming in with complaint of left calf pain. Found to have more of an Achilles tendinosis bilaterally as well as severe ankle arthritis. Patient was to get new shoes which she could not do. Patient has not been doing the home exercises. Not icing. Has been doing the vitamin D. Continues to have pain. Does not know why she is not getting better. Frustrated.     Past Medical History  Diagnosis Date  . Asthma   . Arthritis   . Type 2 diabetes mellitus (HCC)   . Diverticulitis   . Chronic bronchitis   . Headaches, cluster   . Allergic rhinitis, seasonal     assocaited with vertigo sensation  . Chronic kidney disease   . Hypertension   . Hyperlipidemia   . 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 Pellucid marginal degeneration of cornea   . Hives     idiopathic   Past Surgical History  Procedure Laterality Date  . Cataract extraction w/ intraocular lens implant Left 02/04/13    Dr Valere Dross   Social History   Social History  . Marital Status: Married    Spouse Name: N/A  . Number of Children: N/A  . Years of Education: N/A   Social History Main Topics  . Smoking status: Former Smoker -- 3.50 packs/day for 20 years    Types: Cigarettes    Quit date: 08/15/1975  . Smokeless tobacco: Never Used  . Alcohol Use: No  . Drug Use: No  . Sexual Activity: Not Currently   Other Topics Concern  . 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 Ciprofloxacin Other (See Comments)    Vertigo SE  . Codeine     Codeine family  . Latex Dermatitis  . Penicillins   . Sulfa  Antibiotics    Family History  Problem Relation Age of Onset  . Arthritis Other   . Stroke Other   . Hypertension Other   . Hyperlipidemia Other   . Diabetes Other   . Heart disease Father     Past medical history, social, surgical and family history all reviewed in electronic medical record.  No pertanent information unless stated regarding to the chief complaint.   Review of Systems: No headache, visual changes, nausea, vomiting, diarrhea, constipation, dizziness, abdominal pain, skin rash, fevers, chills, night sweats, weight loss, swollen lymph nodes, body aches, joint swelling, muscle aches, chest pain, shortness of breath, mood changes.   Objective Blood pressure 142/90, pulse 67, height  (1.575 m), SpO2 97 %.  General: No apparent distress alert and oriented x3 mood and affect normal, dressed appropriately.  HEENT: Pupils equal, extraocular movements intact  Respiratory: Patient's speak in full sentences and does not appear short of breath  Cardiovascular: 2+ loweron tender, moderate tenderness.  Skin: Warm dry intact with no signs of infection or rash on extremities or on axial skeleton.  Abdomen: Soft nontender  Neuro: Cranial nerves II through XII are intact, neurovascularly intact in all extremities with 2+ DTRs and 2+  pulses.  Lymph: No lymphadenopathy of posterior or anterior cervical chain or axillae bilaterally.  Gait ambulates with the aid of a cane  MSK:  Non tender with full range of motion and good stability and symmetric strength and tone of shoulders, elbows, wrist, hip, knee and ankles bilaterally. Arthritic changes of multiple joints  Moderate to severe arthritic changes of the ankles bilaterally. Patient does have crepitus. Decreased range of motion and dorsiflexion as well as plantar flexion. Neurovascular intact distally. Moderate to severe tenderness to compression of the calf. 2+ pitting edema. No erythema and no signs of infection. No significant changes  from previous exam   Impression and Recommendations:     This case required medical decision making of moderate complexity.      Note: This dictation was prepared with Dragon dictation along with smaller phrase technology. Any transcriptional errors that result from this process are unintentional.

## 2015-11-09 NOTE — Patient Instructions (Signed)
Good to see you  I am sorry I do not have great ideas pennsaid pinkie amount topically 2 times daily as needed.  Continue the vitamins Use the water pill when you need it [place] shoes can be helpful. Check shoe market Also New [place] shoes with greater than 900 could be good as well Otherwise we can do injections in the ankle.

## 2015-11-09 NOTE — Assessment & Plan Note (Signed)
Encourage weight loss. 

## 2015-11-09 NOTE — Assessment & Plan Note (Signed)
Encouraged patient as above for the left ankle.

## 2015-11-09 NOTE — Assessment & Plan Note (Signed)
Encourage patient to use the diuretic on a more regular basis because I do feel that this is giving her trouble. We discussed given compression stockings which patient declined.

## 2015-12-02 ENCOUNTER — Other Ambulatory Visit: Payer: Self-pay | Admitting: *Deleted

## 2015-12-02 MED ORDER — FUROSEMIDE 20 MG PO TABS: 20.0000 mg | ORAL_TABLET | Freq: Every day | ORAL | Status: DC

## 2015-12-02 NOTE — Telephone Encounter (Signed)
Refill done.  

## 2015-12-23 ENCOUNTER — Other Ambulatory Visit: Payer: Self-pay | Admitting: Internal Medicine

## 2016-01-03 ENCOUNTER — Other Ambulatory Visit: Payer: Self-pay | Admitting: *Deleted

## 2016-01-03 MED ORDER — TRIAMCINOLONE ACETONIDE 0.1 % EX CREA: 1.0000 "application " | TOPICAL_CREAM | CUTANEOUS | Status: DC | PRN

## 2016-01-03 NOTE — Telephone Encounter (Signed)
Refill sent to [facility] 

## 2016-01-11 ENCOUNTER — Other Ambulatory Visit (INDEPENDENT_AMBULATORY_CARE_PROVIDER_SITE_OTHER): Payer: Medicare Other

## 2016-01-11 ENCOUNTER — Ambulatory Visit (INDEPENDENT_AMBULATORY_CARE_PROVIDER_SITE_OTHER): Payer: Medicare Other | Admitting: Internal Medicine

## 2016-01-11 ENCOUNTER — Encounter: Payer: Self-pay | Admitting: Internal Medicine

## 2016-01-11 VITALS — BP 138/84 | HR 73 | Temp 97.9°F | Resp 18 | Ht 62.0 in | Wt 239.4 lb

## 2016-01-11 DIAGNOSIS — R110 Nausea: Secondary | ICD-10-CM

## 2016-01-11 DIAGNOSIS — Z1159 Encounter for screening for other viral diseases: Secondary | ICD-10-CM

## 2016-01-11 DIAGNOSIS — R7989 Other specified abnormal findings of blood chemistry: Secondary | ICD-10-CM

## 2016-01-11 DIAGNOSIS — E119 Type 2 diabetes mellitus without complications: Secondary | ICD-10-CM | POA: Diagnosis not present

## 2016-01-11 DIAGNOSIS — K529 Noninfective gastroenteritis and colitis, unspecified: Secondary | ICD-10-CM

## 2016-01-11 LAB — COMPREHENSIVE METABOLIC PANEL
ALBUMIN: 4.6 g/dL (ref 3.5–5.2)
ALT: 19 U/L (ref 0–35)
AST: 17 U/L (ref 0–37)
Alkaline Phosphatase: 45 U/L (ref 39–117)
BILIRUBIN TOTAL: 0.6 mg/dL (ref 0.2–1.2)
BUN: 24 mg/dL — ABNORMAL HIGH (ref 6–23)
CALCIUM: 9.8 mg/dL (ref 8.4–10.5)
CO2: 30 meq/L (ref 19–32)
CREATININE: 1.34 mg/dL — AB (ref 0.40–1.20)
Chloride: 101 mEq/L (ref 96–112)
GFR: 41.73 mL/min — AB (ref >60.00)
Glucose, Bld: 127 mg/dL — ABNORMAL HIGH (ref 70–99)
Potassium: 4.7 mEq/L (ref 3.5–5.1)
Sodium: 139 mEq/L (ref 135–145)
Total Protein: 7.8 g/dL (ref 6.0–8.3)

## 2016-01-11 LAB — CBC
HCT: 37.9 % (ref 36.0–46.0)
Hemoglobin: 12.8 g/dL (ref 12.0–15.0)
MCHC: 33.7 g/dL (ref 30.0–36.0)
MCV: 85.5 fl (ref 78.0–100.0)
PLATELETS: 349 10*3/uL (ref 150.0–400.0)
RBC: 4.43 Mil/uL (ref 3.87–5.11)
RDW: 15.1 % (ref 11.5–15.5)
WBC: 11.3 10*3/uL — AB (ref 4.0–10.5)

## 2016-01-11 LAB — URINALYSIS, ROUTINE W REFLEX MICROSCOPIC
Bilirubin Urine: NEGATIVE
KETONES UR: NEGATIVE
Nitrite: NEGATIVE
PH: 6 (ref 5.0–8.0)
SPECIFIC GRAVITY, URINE: 1.015 (ref 1.000–1.030)
TOTAL PROTEIN, URINE-UPE24: 100 — AB
URINE GLUCOSE: NEGATIVE
UROBILINOGEN UA: 0.2 (ref 0.0–1.0)

## 2016-01-11 LAB — LIPID PANEL
CHOL/HDL RATIO: 4
Cholesterol: 124 mg/dL (ref 0–200)
HDL: 35.5 mg/dL — AB (ref >39.00)
NONHDL: 88.84
TRIGLYCERIDES: 209 mg/dL — AB (ref 0.0–149.0)
VLDL: 41.8 mg/dL — AB (ref 0.0–40.0)

## 2016-01-11 LAB — T4, FREE: Free T4: 0.93 ng/dL (ref 0.60–1.60)

## 2016-01-11 LAB — HEMOGLOBIN A1C: Hgb A1c MFr Bld: 8.2 % — ABNORMAL HIGH (ref 4.6–6.5)

## 2016-01-11 LAB — VITAMIN B12: VITAMIN B 12: 451 pg/mL (ref 211–911)

## 2016-01-11 LAB — LDL CHOLESTEROL, DIRECT: Direct LDL: 58 mg/dL

## 2016-01-11 LAB — TSH: TSH: 4 u[IU]/mL (ref 0.35–4.50)

## 2016-01-11 NOTE — Progress Notes (Signed)
Pre visit review using our clinic review tool, if applicable. No additional management support is needed unless otherwise documented below in the visit note. 

## 2016-01-11 NOTE — Patient Instructions (Signed)
We are going to check the blood work today and the urine to see if we can figure out what is making your stomach worse.   Consider taking the probiotic for 2-3 weeks and if you do not notice any benefit you can stop taking it. If it does help with your symptoms continue taking it.

## 2016-01-11 NOTE — Progress Notes (Signed)
   Subjective:    Patient ID: Jamie KaufmannLinda Aguirre, female    DOB: February 22, 1946, 70 y.o.   MRN: [id number]  HPI The patient is a 70 YO female coming in for nausea without vomiting. She is not able to eat much and only eats 1 small meal per day. She is made sick by the smell of food. Some allergies but overall controlled now. No acid reflux that bothers her. No pain with eating. Some fullness. Some intermittent diarrhea which is stable for the last several years. No blood that she sees.   Review of Systems  Constitutional: Positive for appetite change. Negative for diaphoresis, activity change and fatigue.  HENT: Negative.   Respiratory: Negative for cough, chest tightness, shortness of breath and wheezing.   Cardiovascular: Positive for leg swelling. Negative for chest pain and palpitations.  Gastrointestinal: Positive for nausea. Negative for abdominal pain, constipation and abdominal distention.  Musculoskeletal: Positive for arthralgias. Negative for myalgias, back pain and gait problem.  Neurological: Positive for numbness. Negative for dizziness, speech difficulty, weakness, light-headedness and headaches.      Objective:   Physical Exam  Constitutional: She is oriented to person, place, and time. She appears well-developed and well-nourished.  Overweight  HENT:  Head: Normocephalic and atraumatic.  Mouth/Throat: Oropharynx is clear and moist.  Eyes: EOM are normal.  Neck: Normal range of motion.  Cardiovascular: Normal rate and regular rhythm.   Pulmonary/Chest: Effort normal and breath sounds normal. No respiratory distress. She has no wheezes.  Abdominal: Soft. Bowel sounds are normal. She exhibits no distension. There is no tenderness. There is no rebound.  Neurological: She is alert and oriented to person, place, and time. Coordination normal.  Skin: Skin is warm and dry.   Filed Vitals:   01/11/16 1011  BP: 138/84  Pulse: 73  Temp: 97.9 F (36.6 C)  TempSrc: Oral  Resp: 18    Height: 5\' 2"  (1.575 m)  Weight: 239 lb 6.4 oz (108.591 kg)  SpO2: 98%      Assessment & Plan:

## 2016-01-11 NOTE — Assessment & Plan Note (Signed)
Taking jentadueto and checking Hga1c today, also taking losartan and lipitor. Foot exam not done due to acute complaints which were more important to patient. Adjust as needed.

## 2016-01-11 NOTE — Assessment & Plan Note (Signed)
Checking TSH, CMP, CBC, HgA1c, B12 for metabolic cause. She does have longstanding diabetes. Does also have longstanding diarrhea and she is asked to return to her GI doctor for consideration of further evaluation. She declines zofran but wants to think about it.

## 2016-01-12 LAB — HEPATITIS C ANTIBODY: HCV AB: NEGATIVE

## 2016-01-14 ENCOUNTER — Other Ambulatory Visit: Payer: Self-pay | Admitting: Internal Medicine

## 2016-01-14 ENCOUNTER — Telehealth: Payer: Self-pay | Admitting: Internal Medicine

## 2016-01-14 MED ORDER — NITROFURANTOIN MONOHYD MACRO 100 MG PO CAPS: 100.0000 mg | ORAL_CAPSULE | Freq: Two times a day (BID) | ORAL | Status: DC

## 2016-01-14 NOTE — Telephone Encounter (Signed)
Informed pt her last one was done in 2015. She sates she was told to do one every 2 yrs. Please advise on ordering one.

## 2016-01-14 NOTE — Telephone Encounter (Signed)
Would like to know when last cologuard screening was

## 2016-01-17 NOTE — Telephone Encounter (Signed)
These are done every 3 years so not due.

## 2016-02-08 ENCOUNTER — Telehealth: Payer: Self-pay

## 2016-02-08 NOTE — Telephone Encounter (Signed)
Patient is on the list for [facility] 2017 and may be a good candidate for an AWV in 2017. Please let me know if/when appt is scheduled.   Note: there is an appt with PCP in November, but it is a 15 min appt.

## 2016-02-28 ENCOUNTER — Other Ambulatory Visit: Payer: Self-pay | Admitting: *Deleted

## 2016-02-28 MED ORDER — TRIAMCINOLONE ACETONIDE 0.1 % EX CREA: 1.0000 "application " | TOPICAL_CREAM | Freq: Two times a day (BID) | CUTANEOUS | Status: AC | PRN

## 2016-02-28 NOTE — Telephone Encounter (Signed)
Receive call pt is requesting a refill on triamcinolone cream. Pt states she needs a bigger tube if possible. She uses more when the weather is hot she has broken out in genital area. [facility]

## 2016-03-25 ENCOUNTER — Other Ambulatory Visit: Payer: Self-pay | Admitting: Internal Medicine

## 2016-04-27 ENCOUNTER — Ambulatory Visit (INDEPENDENT_AMBULATORY_CARE_PROVIDER_SITE_OTHER): Payer: Medicare Other | Admitting: General Practice

## 2016-04-27 DIAGNOSIS — Z23 Encounter for immunization: Secondary | ICD-10-CM | POA: Diagnosis not present

## 2016-05-05 ENCOUNTER — Telehealth: Payer: Self-pay | Admitting: Internal Medicine

## 2016-05-05 NOTE — Telephone Encounter (Signed)
[facility] - Client TELEPHONE ADVICE RECORD [facility] Patient Name: Jamie KaufmannLINDA Aguirre DOB: 1946-04-06 Initial Comment caller states she has a spot on arm between the wrist and elbow that's getting larger Nurse Assessment Nurse: Lane HackerHarley, RN, [name] Date/Time (Eastern Time): 05/05/2016 9:21:59 AM Confirm and document reason for call. If symptomatic, describe symptoms. You must click the next button to save text entered. ---Caller states she has a spot on right arm between the wrist and elbow that's getting larger. About size of little finger nail. Looks burgundy. Smooth skin. She has more spots on her forearms bilateral. Noticed in last 2 wks. No fever. Has the patient traveled out of the country within the last 30 days? ---Not Applicable Does the patient have any new or worsening symptoms? ---Yes Will a triage be completed? ---Yes Related visit to physician within the last 2 weeks? ---No Does the PT have any chronic conditions? (i.e. diabetes, asthma, etc.) ---Yes List chronic conditions. ---CVA x 2; arthritis; HTN; asthma; seeing dermatologist over several years Is this a behavioral health or substance abuse call? ---No Guidelines Guideline Title Affirmed Question Affirmed Notes Skin Lesion - Moles or Growths [1] Skin growth or mole AND [2] two sides do not look the same (i.e., asymmetric) Final Disposition User See PCP within 2 Mikey CollegeWeeks [name], RN, [name] Comments She states that she has had a dermatologist for 20 years. RN advised that she go ahead and call them now to be seen. She verbalized understanding. Disagree/Comply: Comply

## 2016-07-13 ENCOUNTER — Ambulatory Visit: Payer: Medicare Other | Admitting: Internal Medicine

## 2016-07-25 ENCOUNTER — Ambulatory Visit: Payer: Medicare Other | Admitting: Internal Medicine

## 2016-07-27 ENCOUNTER — Ambulatory Visit (INDEPENDENT_AMBULATORY_CARE_PROVIDER_SITE_OTHER): Payer: Medicare Other | Admitting: Internal Medicine

## 2016-07-27 ENCOUNTER — Encounter: Payer: Self-pay | Admitting: Internal Medicine

## 2016-07-27 DIAGNOSIS — K529 Noninfective gastroenteritis and colitis, unspecified: Secondary | ICD-10-CM | POA: Diagnosis not present

## 2016-07-27 DIAGNOSIS — E119 Type 2 diabetes mellitus without complications: Secondary | ICD-10-CM

## 2016-07-27 MED ORDER — EMPAGLIFLOZIN 10 MG PO TABS: 10.0000 mg | ORAL_TABLET | Freq: Every day | ORAL | 6 refills | Status: DC

## 2016-07-27 MED ORDER — PANCRELIPASE (LIP-PROT-AMYL) 36000-114000 UNITS PO CPEP: 36000.0000 [IU] | ORAL_CAPSULE | Freq: Three times a day (TID) | ORAL | 6 refills | Status: AC

## 2016-07-27 NOTE — Progress Notes (Signed)
Pre visit review using our clinic review tool, if applicable. No additional management support is needed unless otherwise documented below in the visit note. 

## 2016-07-27 NOTE — Assessment & Plan Note (Signed)
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 Rx for creon today to see if this improves her QOL. She is having multiple bowel movements per day with little warning. Imodium was not very effective.

## 2016-07-27 NOTE — Progress Notes (Signed)
   Subjective:    Patient ID: Jamie KaufmannLinda Almeda, female    DOB: 1946/01/04, 70 y.o.   MRN: [id number]  HPI The patient is a 70 YO female coming in for bowel problems. She is having diarrhea still. This has been going on for several years. She has tried changing her diet and this has not helped. She is not always able to make it to the bathroom. She has had accidents and is frustrated and using diapers sometimes. This limits her from leaving the house. She had tried imodium with some success over the years (going on 5-6+ years) and felt she was taking too much imodium so just stopped. She has not seen GI about this. She did not tell anyone about this for several years. No blood in the bowels. No abdominal pain or cramping.   Review of Systems  Constitutional: Negative.   Respiratory: Negative.   Cardiovascular: Negative.   Gastrointestinal: Positive for abdominal distention and diarrhea. Negative for abdominal pain, anal bleeding, blood in stool, constipation, nausea and vomiting.  Musculoskeletal: Negative.   Skin: Negative.       Objective:   Physical Exam  Constitutional: She is oriented to person, place, and time. She appears well-developed and well-nourished.  Overweight  HENT:  Head: Normocephalic and atraumatic.  Eyes: EOM are normal.  Cardiovascular: Normal rate and regular rhythm.   Pulmonary/Chest: Effort normal. No respiratory distress. She has no wheezes. She has no rales.  Abdominal: Soft. Bowel sounds are normal. She exhibits no distension. There is no tenderness. There is no rebound and no guarding.  Neurological: She is alert and oriented to person, place, and time.  Skin: Skin is warm and dry.   Vitals:   07/27/16 1119  BP: (!) 162/82  Pulse: 81  Resp: 18  Temp: 97.4 F (36.3 C)  TempSrc: Oral  SpO2: 98%  Weight: 249 lb (112.9 kg)      Assessment & Plan:

## 2016-07-27 NOTE — Patient Instructions (Signed)
We have sent in creon for the bowels to see if this works.   Take 1 pill with every meal and 1 pill with snacks.   We have sent in jardiance for the weight which is a once a day pill. This can cause more urination.

## 2016-07-27 NOTE — Assessment & Plan Note (Signed)
She agrees to add jardiance today. Continue jentadueto. Last HgA1c above goal.

## 2016-08-15 ENCOUNTER — Encounter: Payer: Self-pay | Admitting: Internal Medicine

## 2016-08-15 ENCOUNTER — Ambulatory Visit (INDEPENDENT_AMBULATORY_CARE_PROVIDER_SITE_OTHER): Payer: Medicare Other | Admitting: Internal Medicine

## 2016-08-15 VITALS — BP 158/70 | HR 75 | Temp 97.6°F | Resp 14 | Ht 62.0 in | Wt 240.5 lb

## 2016-08-15 DIAGNOSIS — E119 Type 2 diabetes mellitus without complications: Secondary | ICD-10-CM | POA: Diagnosis not present

## 2016-08-15 DIAGNOSIS — E785 Hyperlipidemia, unspecified: Secondary | ICD-10-CM | POA: Diagnosis not present

## 2016-08-15 DIAGNOSIS — Z0000 Encounter for general adult medical examination without abnormal findings: Secondary | ICD-10-CM

## 2016-08-15 DIAGNOSIS — K529 Noninfective gastroenteritis and colitis, unspecified: Secondary | ICD-10-CM

## 2016-08-15 DIAGNOSIS — I10 Essential (primary) hypertension: Secondary | ICD-10-CM

## 2016-08-15 MED ORDER — BENZONATATE 200 MG PO CAPS: 200.0000 mg | ORAL_CAPSULE | Freq: Three times a day (TID) | ORAL | 3 refills | Status: DC | PRN

## 2016-08-15 NOTE — Patient Instructions (Addendum)
We are checking the blood work today and will send the results in the mail.   We are going to have you stay on the creon as this is helping with the diarrhea.   Stop taking the jardiance as this is causing the bladder to move more.   The diet is okay to do with the diabetes. If you want we can have you talk to a nutrition expert on creating a plan just for you.  Diabetes Mellitus and Food It is important for you to manage your blood sugar (glucose) level. Your blood glucose level can be greatly affected by what you eat. Eating healthier foods in the appropriate amounts throughout the day at about the same time each day will help you control your blood glucose level. It can also help slow or prevent worsening of your diabetes mellitus. Healthy eating may even help you improve the level of your blood pressure and reach or maintain a healthy weight. General recommendations for healthful eating and cooking habits include:  Eating meals and snacks regularly. Avoid going long periods of time without eating to lose weight.  Eating a diet that consists mainly of plant-based foods, such as fruits, vegetables, nuts, legumes, and whole grains.  Using low-heat cooking methods, such as baking, instead of high-heat cooking methods, such as deep frying. Work with your dietitian to make sure you understand how to use the Nutrition Facts information on food labels. How can food affect me? Carbohydrates  Carbohydrates affect your blood glucose level more than any other type of food. Your dietitian will help you determine how many carbohydrates to eat at each meal and teach you how to count carbohydrates. Counting carbohydrates is important to keep your blood glucose at a healthy level, especially if you are using insulin or taking certain medicines for diabetes mellitus. Alcohol  Alcohol can cause sudden decreases in blood glucose (hypoglycemia), especially if you use insulin or take certain medicines for  diabetes mellitus. Hypoglycemia can be a life-threatening condition. Symptoms of hypoglycemia (sleepiness, dizziness, and disorientation) are similar to symptoms of having too much alcohol. If your health care provider has given you approval to drink alcohol, do so in moderation and use the following guidelines:  Women should not have more than one drink per day, and men should not have more than two drinks per day. One drink is equal to:  12 oz of beer.  5 oz of wine.  1 oz of hard liquor.  Do not drink on an empty stomach.  Keep yourself hydrated. Have water, diet soda, or unsweetened iced tea.  Regular soda, juice, and other mixers might contain a lot of carbohydrates and should be counted. What foods are not recommended? As you make food choices, it is important to remember that all foods are not the same. Some foods have fewer nutrients per serving than other foods, even though they might have the same number of calories or carbohydrates. It is difficult to get your body what it needs when you eat foods with fewer nutrients. Examples of foods that you should avoid that are high in calories and carbohydrates but low in nutrients include:  Trans fats (most processed foods list trans fats on the Nutrition Facts label).  Regular soda.  Juice.  Candy.  Sweets, such as cake, pie, doughnuts, and cookies.  Fried foods. What foods can I eat? Eat nutrient-rich foods, which will nourish your body and keep you healthy. The food you should eat also will depend on several  factors, including:  The calories you need.  The medicines you take.  Your weight.  Your blood glucose level.  Your blood pressure level.  Your cholesterol level. You should eat a variety of foods, including:  Protein.  Lean cuts of meat.  Proteins low in saturated fats, such as fish, egg whites, and beans. Avoid processed meats.  Fruits and vegetables.  Fruits and vegetables that may help control blood  glucose levels, such as apples, mangoes, and yams.  Dairy products.  Choose fat-free or low-fat dairy products, such as milk, yogurt, and cheese.  Grains, bread, pasta, and rice.  Choose whole grain products, such as multigrain bread, whole oats, and brown rice. These foods may help control blood pressure.  Fats.  Foods containing healthful fats, such as nuts, avocado, olive oil, canola oil, and fish. Does everyone with diabetes mellitus have the same meal plan? Because every person with diabetes mellitus is different, there is not one meal plan that works for everyone. It is very important that you meet with a dietitian who will help you create a meal plan that is just right for you. This information is not intended to replace advice given to you by your health care provider. Make sure you discuss any questions you have with your health care provider. Document Released: 04/27/2005 Document Revised: 01/06/2016 Document Reviewed: 06/27/2013 Elsevier Interactive Patient Education  2017 [place] Maintenance, Female Introduction Adopting a healthy lifestyle and getting preventive care can go a long way to promote health and wellness. Talk with your health care provider about what schedule of regular examinations is right for you. This is a good chance for you to check in with your provider about disease prevention and staying healthy. In between checkups, there are plenty of things you can do on your own. Experts have done a lot of research about which lifestyle changes and preventive measures are most likely to keep you healthy. Ask your health care provider for more information. Weight and diet Eat a healthy diet  Be sure to include plenty of vegetables, fruits, low-fat dairy products, and lean protein.  Do not eat a lot of foods high in solid fats, added sugars, or salt.  Get regular exercise. This is one of the most important things you can do for your health.  Most  adults should exercise for at least 150 minutes each week. The exercise should increase your heart rate and make you sweat (moderate-intensity exercise).  Most adults should also do strengthening exercises at least twice a week. This is in addition to the moderate-intensity exercise. Maintain a healthy weight  Body mass index (BMI) is a measurement that can be used to identify possible weight problems. It estimates body fat based on height and weight. Your health care provider can help determine your BMI and help you achieve or maintain a healthy weight.  For females 8 years of age and older:  A BMI below 18.5 is considered underweight.  A BMI of 18.5 to 24.9 is normal.  A BMI of 25 to 29.9 is considered overweight.  A BMI of 30 and above is considered obese. Watch levels of cholesterol and blood lipids  You should start having your blood tested for lipids and cholesterol at 70 years of age, then have this test every 5 years.  You may need to have your cholesterol levels checked more often if:  Your lipid or cholesterol levels are high.  You are older than 70 years of age.  You are at high risk for heart disease. Cancer screening Lung Cancer  Lung cancer screening is recommended for adults 13-23 years old who are at high risk for lung cancer because of a history of smoking.  A yearly low-dose CT scan of the lungs is recommended for people who:  Currently smoke.  Have quit within the past 15 years.  Have at least a 30-pack-year history of smoking. A pack year is smoking an average of one pack of cigarettes a day for 1 year.  Yearly screening should continue until it has been 15 years since you quit.  Yearly screening should stop if you develop a health problem that would prevent you from having lung cancer treatment. Breast Cancer  Practice breast self-awareness. This means understanding how your breasts normally appear and feel.  It also means doing regular breast  self-exams. Let your health care provider know about any changes, no matter how small.  If you are in your 20s or 30s, you should have a clinical breast exam (CBE) by a health care provider every 1-3 years as part of a regular health exam.  If you are 12 or older, have a CBE every year. Also consider having a breast X-ray (mammogram) every year.  If you have a family history of breast cancer, talk to your health care provider about genetic screening.  If you are at high risk for breast cancer, talk to your health care provider about having an MRI and a mammogram every year.  Breast cancer gene (BRCA) assessment is recommended for women who have family members with BRCA-related cancers. BRCA-related cancers include:  Breast.  Ovarian.  Tubal.  Peritoneal cancers.  Results of the assessment will determine the need for genetic counseling and BRCA1 and BRCA2 testing. Cervical Cancer  Your health care provider may recommend that you be screened regularly for cancer of the pelvic organs (ovaries, uterus, and vagina). This screening involves a pelvic examination, including checking for microscopic changes to the surface of your cervix (Pap test). You may be encouraged to have this screening done every 3 years, beginning at age 23.  For women ages 4-65, health care providers may recommend pelvic exams and Pap testing every 3 years, or they may recommend the Pap and pelvic exam, combined with testing for human papilloma virus (HPV), every 5 years. Some types of HPV increase your risk of cervical cancer. Testing for HPV may also be done on women of any age with unclear Pap test results.  Other health care providers may not recommend any screening for nonpregnant women who are considered low risk for pelvic cancer and who do not have symptoms. Ask your health care provider if a screening pelvic exam is right for you.  If you have had past treatment for cervical cancer or a condition that could lead  to cancer, you need Pap tests and screening for cancer for at least 20 years after your treatment. If Pap tests have been discontinued, your risk factors (such as having a new sexual partner) need to be reassessed to determine if screening should resume. Some women have medical problems that increase the chance of getting cervical cancer. In these cases, your health care provider may recommend more frequent screening and Pap tests. Colorectal Cancer  This type of cancer can be detected and often prevented.  Routine colorectal cancer screening usually begins at 70 years of age and continues through 70 years of age.  Your health care provider may recommend screening at an earlier  age if you have risk factors for colon cancer.  Your health care provider may also recommend using home test kits to check for hidden blood in the stool.  A small camera at the end of a tube can be used to examine your colon directly (sigmoidoscopy or colonoscopy). This is done to check for the earliest forms of colorectal cancer.  Routine screening usually begins at age 26.  Direct examination of the colon should be repeated every 5-10 years through 70 years of age. However, you may need to be screened more often if early forms of precancerous polyps or small growths are found. Skin Cancer  Check your skin from head to toe regularly.  Tell your health care provider about any new moles or changes in moles, especially if there is a change in a mole's shape or color.  Also tell your health care provider if you have a mole that is larger than the size of a pencil eraser.  Always use sunscreen. Apply sunscreen liberally and repeatedly throughout the day.  Protect yourself by wearing long sleeves, pants, a wide-brimmed hat, and sunglasses whenever you are outside. Heart disease, diabetes, and high blood pressure  High blood pressure causes heart disease and increases the risk of stroke. High blood pressure is more  likely to develop in:  People who have blood pressure in the high end of the normal range (130-139/85-89 mm Hg).  People who are overweight or obese.  People who are African American.  If you are 31-69 years of age, have your blood pressure checked every 3-5 years. If you are 31 years of age or older, have your blood pressure checked every year. You should have your blood pressure measured twice-once when you are at a hospital or clinic, and once when you are not at a hospital or clinic. Record the average of the two measurements. To check your blood pressure when you are not at a hospital or clinic, you can use:  An automated blood pressure machine at a pharmacy.  A home blood pressure monitor.  If you are between 86 years and 54 years old, ask your health care provider if you should take aspirin to prevent strokes.  Have regular diabetes screenings. This involves taking a blood sample to check your fasting blood sugar level.  If you are at a normal weight and have a low risk for diabetes, have this test once every three years after 70 years of age.  If you are overweight and have a high risk for diabetes, consider being tested at a younger age or more often. Preventing infection Hepatitis B  If you have a higher risk for hepatitis B, you should be screened for this virus. You are considered at high risk for hepatitis B if:  You were born in a country where hepatitis B is common. Ask your health care provider which countries are considered high risk.  Your parents were born in a high-risk country, and you have not been immunized against hepatitis B (hepatitis B vaccine).  You have HIV or AIDS.  You use needles to inject street drugs.  You live with someone who has hepatitis B.  You have had sex with someone who has hepatitis B.  You get hemodialysis treatment.  You take certain medicines for conditions, including cancer, organ transplantation, and autoimmune  conditions. Hepatitis C  Blood testing is recommended for:  Everyone born from [birth year] through 1964.  Anyone with known risk factors for hepatitis C. Sexually transmitted infections (  STIs)  You should be screened for sexually transmitted infections (STIs) including gonorrhea and chlamydia if:  You are sexually active and are younger than 70 years of age.  You are older than 70 years of age and your health care provider tells you that you are at risk for this type of infection.  Your sexual activity has changed since you were last screened and you are at an increased risk for chlamydia or gonorrhea. Ask your health care provider if you are at risk.  If you do not have HIV, but are at risk, it may be recommended that you take a prescription medicine daily to prevent HIV infection. This is called pre-exposure prophylaxis (PrEP). You are considered at risk if:  You are sexually active and do not regularly use condoms or know the HIV status of your partner(s).  You take drugs by injection.  You are sexually active with a partner who has HIV. Talk with your health care provider about whether you are at high risk of being infected with HIV. If you choose to begin PrEP, you should first be tested for HIV. You should then be tested every 3 months for as long as you are taking PrEP. Pregnancy  If you are premenopausal and you may become pregnant, ask your health care provider about preconception counseling.  If you may become pregnant, take 400 to 800 micrograms (mcg) of folic acid every day.  If you want to prevent pregnancy, talk to your health care provider about birth control (contraception). Osteoporosis and menopause  Osteoporosis is a disease in which the bones lose minerals and strength with aging. This can result in serious bone fractures. Your risk for osteoporosis can be identified using a bone density scan.  If you are 66 years of age or older, or if you are at risk for  osteoporosis and fractures, ask your health care provider if you should be screened.  Ask your health care provider whether you should take a calcium or vitamin D supplement to lower your risk for osteoporosis.  Menopause may have certain physical symptoms and risks.  Hormone replacement therapy may reduce some of these symptoms and risks. Talk to your health care provider about whether hormone replacement therapy is right for you. Follow these instructions at home:  Schedule regular health, dental, and eye exams.  Stay current with your immunizations.  Do not use any tobacco products including cigarettes, chewing tobacco, or electronic cigarettes.  If you are pregnant, do not drink alcohol.  If you are breastfeeding, limit how much and how often you drink alcohol.  Limit alcohol intake to no more than 1 drink per day for nonpregnant women. One drink equals 12 ounces of beer, 5 ounces of wine, or 1 ounces of hard liquor.  Do not use street drugs.  Do not share needles.  Ask your health care provider for help if you need support or information about quitting drugs.  Tell your health care provider if you often feel depressed.  Tell your health care provider if you have ever been abused or do not feel safe at home. This information is not intended to replace advice given to you by your health care provider. Make sure you discuss any questions you have with your health care provider. Document Released: 02/13/2011 Document Revised: 01/06/2016 Document Reviewed: 05/04/2015  2017 Elsevier

## 2016-08-15 NOTE — Progress Notes (Signed)
   Subjective:    Patient ID: Jamie KaufmannLinda Aguirre, female    DOB: 06/04/1946, 71 y.o.   MRN: [id number]  HPI Here for medicare wellness and physical, no new complaints. Please see A/P for status and treatment of chronic medical problems.   Diet: DM since diabetic, poor lately with holidays Physical activity: sedentary Depression/mood screen: negative Hearing: intact to whispered voice Visual acuity: grossly normal with lens, performs annual eye exam  ADLs: capable Fall risk: none Home safety: good Cognitive evaluation: intact to orientation, naming, recall and repetition EOL planning: adv directives discussed  I have personally reviewed and have noted 1. The patient's medical and social history - reviewed today no changes 2. Their use of alcohol, tobacco or illicit drugs 3. Their current medications and supplements 4. The patient's functional ability including ADL's, fall risks, home safety risks and hearing or visual impairment. 5. Diet and physical activities 6. Evidence for depression or mood disorders 7. Care team reviewed and updated (available in snapshot)  Review of Systems  Constitutional: Positive for activity change. Negative for appetite change, chills, fatigue and fever.  HENT: Negative.   Eyes: Negative.   Respiratory: Negative for cough, chest tightness and shortness of breath.   Cardiovascular: Negative for chest pain, palpitations and leg swelling.  Gastrointestinal: Positive for diarrhea. Negative for abdominal distention, abdominal pain, constipation, nausea and vomiting.       Improved with creon  Musculoskeletal: Positive for arthralgias. Negative for back pain, joint swelling, myalgias and neck stiffness.  Skin: Negative.   Neurological: Negative.   Psychiatric/Behavioral: Negative.       Objective:   Physical Exam  Constitutional: She is oriented to person, place, and time. She appears well-developed and well-nourished.  Overweight  HENT:  Head:  Normocephalic and atraumatic.  Eyes: EOM are normal.  Neck: Normal range of motion.  Cardiovascular: Normal rate and regular rhythm.   Pulmonary/Chest: Effort normal and breath sounds normal. No respiratory distress. She has no wheezes. She has no rales.  Abdominal: Soft. Bowel sounds are normal. She exhibits no distension. There is no tenderness. There is no rebound.  Musculoskeletal: She exhibits no edema.  Neurological: She is alert and oriented to person, place, and time. Coordination normal.  Skin: Skin is warm and dry.  Psychiatric: She has a normal mood and affect.   Vitals:   08/15/16 1039  BP: (!) 158/70  Pulse: 75  Resp: 14  Temp: 97.6 F (36.4 C)  TempSrc: Oral  SpO2: 99%  Weight: 240 lb 8 oz (109.1 kg)  Height: 5\' 2"  (1.575 m)      Assessment & Plan:

## 2016-08-15 NOTE — Progress Notes (Signed)
Pre visit review using our clinic review tool, if applicable. No additional management support is needed unless otherwise documented below in the visit note. 

## 2016-08-17 NOTE — Assessment & Plan Note (Signed)
Added jardiance at last visit which has given her incontinence of urine. Since we are trying to limit her bathroom time will stop. Continue jentadueto. Still on losartan and lipitor.

## 2016-08-17 NOTE — Assessment & Plan Note (Signed)
Creon has helped with her diarrhea quite a bit so we will continue.

## 2016-08-17 NOTE — Assessment & Plan Note (Signed)
BP slightly above goal today and she has not taken her meds this morning. Previously at goal. Taking losartan and metoprolol. Checking CMP and adjust as needed.

## 2016-08-17 NOTE — Assessment & Plan Note (Signed)
Checking lipid panel and adjust as needed her lipitor 20 mg daily.  

## 2016-08-17 NOTE — Assessment & Plan Note (Signed)
Declines colonoscopy, mammogram up to date. Shingles and tetanus and pneumonia and flu shots are up to date. Counseled about sun safety and mole surveillance as well as the need for good home safety. Given 10 year screening recommendations.

## 2016-08-25 ENCOUNTER — Other Ambulatory Visit (INDEPENDENT_AMBULATORY_CARE_PROVIDER_SITE_OTHER): Payer: Medicare Other

## 2016-08-25 DIAGNOSIS — Z0000 Encounter for general adult medical examination without abnormal findings: Secondary | ICD-10-CM | POA: Diagnosis not present

## 2016-08-25 DIAGNOSIS — R7989 Other specified abnormal findings of blood chemistry: Secondary | ICD-10-CM

## 2016-08-25 LAB — CBC
HEMATOCRIT: 38 % (ref 36.0–46.0)
HEMOGLOBIN: 13 g/dL (ref 12.0–15.0)
MCHC: 34.1 g/dL (ref 30.0–36.0)
MCV: 83.8 fl (ref 78.0–100.0)
PLATELETS: 308 10*3/uL (ref 150.0–400.0)
RBC: 4.53 Mil/uL (ref 3.87–5.11)
RDW: 15.4 % (ref 11.5–15.5)
WBC: 11.9 10*3/uL — ABNORMAL HIGH (ref 4.0–10.5)

## 2016-08-25 LAB — COMPREHENSIVE METABOLIC PANEL
ALBUMIN: 4.1 g/dL (ref 3.5–5.2)
ALK PHOS: 48 U/L (ref 39–117)
ALT: 22 U/L (ref 0–35)
AST: 15 U/L (ref 0–37)
BUN: 31 mg/dL — AB (ref 6–23)
CO2: 27 mEq/L (ref 19–32)
CREATININE: 1.17 mg/dL (ref 0.40–1.20)
Calcium: 9.3 mg/dL (ref 8.4–10.5)
Chloride: 101 mEq/L (ref 96–112)
GFR: 48.72 mL/min — ABNORMAL LOW (ref >60.00)
GLUCOSE: 195 mg/dL — AB (ref 70–99)
Potassium: 4.8 mEq/L (ref 3.5–5.1)
SODIUM: 136 meq/L (ref 135–145)
TOTAL PROTEIN: 7.2 g/dL (ref 6.0–8.3)
Total Bilirubin: 0.4 mg/dL (ref 0.2–1.2)

## 2016-08-25 LAB — LIPID PANEL
CHOLESTEROL: 235 mg/dL — AB (ref 0–200)
HDL: 39.8 mg/dL (ref >39.00)
Total CHOL/HDL Ratio: 6
Triglycerides: 426 mg/dL — ABNORMAL HIGH (ref 0.0–149.0)

## 2016-08-25 LAB — LDL CHOLESTEROL, DIRECT: Direct LDL: 124 mg/dL

## 2016-08-25 LAB — HEMOGLOBIN A1C: HEMOGLOBIN A1C: 10.2 % — AB (ref 4.6–6.5)

## 2016-09-03 ENCOUNTER — Other Ambulatory Visit: Payer: Self-pay | Admitting: Internal Medicine

## 2016-09-07 ENCOUNTER — Telehealth: Payer: Self-pay | Admitting: Emergency Medicine

## 2016-09-07 DIAGNOSIS — E1165 Type 2 diabetes mellitus with hyperglycemia: Principal | ICD-10-CM

## 2016-09-07 DIAGNOSIS — IMO0001 Reserved for inherently not codable concepts without codable children: Secondary | ICD-10-CM

## 2016-09-07 NOTE — Addendum Note (Signed)
Addended by: Hillard DankerRAWFORD, ELIZABETH A on: 09/07/2016 02:03 PM   Modules accepted: Orders

## 2016-09-07 NOTE — Telephone Encounter (Signed)
She should be hearing back about the referral for nutrition for her diabetes. I am not sure about the other thing she is talking about.

## 2016-09-07 NOTE — Telephone Encounter (Signed)
Labs have not been released for patient to see yet, and asking about referrals for diabetes, and she thinks stomach issues was the other

## 2016-09-07 NOTE — Telephone Encounter (Signed)
Pt called and wants to know if she can get a copy of her labs mailed to her. She also has some questions about 2 referrals that Dr Okey Duprerawford was goin to do at her last visit. Please give her a call back. Thanks.

## 2016-10-13 ENCOUNTER — Ambulatory Visit: Payer: Medicare Other | Admitting: Dietician

## 2016-10-21 ENCOUNTER — Encounter (HOSPITAL_COMMUNITY): Payer: Self-pay

## 2016-10-21 ENCOUNTER — Observation Stay (HOSPITAL_COMMUNITY): Payer: Medicare Other

## 2016-10-21 ENCOUNTER — Inpatient Hospital Stay (HOSPITAL_COMMUNITY)
Admission: EM | Admit: 2016-10-21 | Discharge: 2016-10-27 | DRG: 065 | Disposition: A | Discharge status: Skilled Nursing Facility | Payer: Medicare Other | Attending: Family Medicine | Admitting: Family Medicine

## 2016-10-21 ENCOUNTER — Emergency Department (HOSPITAL_COMMUNITY): Payer: Medicare Other

## 2016-10-21 DIAGNOSIS — Z7982 Long term (current) use of aspirin: Secondary | ICD-10-CM

## 2016-10-21 DIAGNOSIS — R4182 Altered mental status, unspecified: Secondary | ICD-10-CM | POA: Diagnosis not present

## 2016-10-21 DIAGNOSIS — I63312 Cerebral infarction due to thrombosis of left middle cerebral artery: Secondary | ICD-10-CM

## 2016-10-21 DIAGNOSIS — Z8249 Family history of ischemic heart disease and other diseases of the circulatory system: Secondary | ICD-10-CM

## 2016-10-21 DIAGNOSIS — I63512 Cerebral infarction due to unspecified occlusion or stenosis of left middle cerebral artery: Secondary | ICD-10-CM | POA: Diagnosis not present

## 2016-10-21 DIAGNOSIS — R81 Glycosuria: Secondary | ICD-10-CM | POA: Diagnosis not present

## 2016-10-21 DIAGNOSIS — Z833 Family history of diabetes mellitus: Secondary | ICD-10-CM

## 2016-10-21 DIAGNOSIS — M25552 Pain in left hip: Secondary | ICD-10-CM | POA: Diagnosis not present

## 2016-10-21 DIAGNOSIS — J45909 Unspecified asthma, uncomplicated: Secondary | ICD-10-CM | POA: Diagnosis present

## 2016-10-21 DIAGNOSIS — I63513 Cerebral infarction due to unspecified occlusion or stenosis of bilateral middle cerebral arteries: Principal | ICD-10-CM | POA: Diagnosis present

## 2016-10-21 DIAGNOSIS — G8194 Hemiplegia, unspecified affecting left nondominant side: Secondary | ICD-10-CM | POA: Diagnosis present

## 2016-10-21 DIAGNOSIS — N179 Acute kidney failure, unspecified: Secondary | ICD-10-CM | POA: Diagnosis present

## 2016-10-21 DIAGNOSIS — I129 Hypertensive chronic kidney disease with stage 1 through stage 4 chronic kidney disease, or unspecified chronic kidney disease: Secondary | ICD-10-CM | POA: Diagnosis present

## 2016-10-21 DIAGNOSIS — Z6841 Body Mass Index (BMI) 40.0 and over, adult: Secondary | ICD-10-CM

## 2016-10-21 DIAGNOSIS — T424X5A Adverse effect of benzodiazepines, initial encounter: Secondary | ICD-10-CM | POA: Diagnosis not present

## 2016-10-21 DIAGNOSIS — D72829 Elevated white blood cell count, unspecified: Secondary | ICD-10-CM | POA: Diagnosis present

## 2016-10-21 DIAGNOSIS — I639 Cerebral infarction, unspecified: Secondary | ICD-10-CM

## 2016-10-21 DIAGNOSIS — R350 Frequency of micturition: Secondary | ICD-10-CM | POA: Diagnosis present

## 2016-10-21 DIAGNOSIS — F40240 Claustrophobia: Secondary | ICD-10-CM | POA: Diagnosis present

## 2016-10-21 DIAGNOSIS — Z7984 Long term (current) use of oral hypoglycemic drugs: Secondary | ICD-10-CM

## 2016-10-21 DIAGNOSIS — R109 Unspecified abdominal pain: Secondary | ICD-10-CM

## 2016-10-21 DIAGNOSIS — E119 Type 2 diabetes mellitus without complications: Secondary | ICD-10-CM

## 2016-10-21 DIAGNOSIS — I10 Essential (primary) hypertension: Secondary | ICD-10-CM | POA: Diagnosis present

## 2016-10-21 DIAGNOSIS — E1122 Type 2 diabetes mellitus with diabetic chronic kidney disease: Secondary | ICD-10-CM | POA: Diagnosis present

## 2016-10-21 DIAGNOSIS — I161 Hypertensive emergency: Secondary | ICD-10-CM | POA: Diagnosis present

## 2016-10-21 DIAGNOSIS — Z7902 Long term (current) use of antithrombotics/antiplatelets: Secondary | ICD-10-CM

## 2016-10-21 DIAGNOSIS — E785 Hyperlipidemia, unspecified: Secondary | ICD-10-CM | POA: Diagnosis present

## 2016-10-21 DIAGNOSIS — N183 Chronic kidney disease, stage 3 (moderate): Secondary | ICD-10-CM | POA: Diagnosis present

## 2016-10-21 DIAGNOSIS — H9190 Unspecified hearing loss, unspecified ear: Secondary | ICD-10-CM | POA: Diagnosis present

## 2016-10-21 DIAGNOSIS — Z87891 Personal history of nicotine dependence: Secondary | ICD-10-CM

## 2016-10-21 DIAGNOSIS — R29810 Facial weakness: Secondary | ICD-10-CM | POA: Diagnosis present

## 2016-10-21 DIAGNOSIS — R627 Adult failure to thrive: Secondary | ICD-10-CM | POA: Diagnosis present

## 2016-10-21 DIAGNOSIS — G4733 Obstructive sleep apnea (adult) (pediatric): Secondary | ICD-10-CM | POA: Diagnosis present

## 2016-10-21 DIAGNOSIS — Z79899 Other long term (current) drug therapy: Secondary | ICD-10-CM

## 2016-10-21 DIAGNOSIS — Z823 Family history of stroke: Secondary | ICD-10-CM

## 2016-10-21 LAB — URINALYSIS, ROUTINE W REFLEX MICROSCOPIC
BILIRUBIN URINE: NEGATIVE
Bacteria, UA: NONE SEEN
HGB URINE DIPSTICK: NEGATIVE
Ketones, ur: NEGATIVE mg/dL
Leukocytes, UA: NEGATIVE
Nitrite: NEGATIVE
PH: 8 (ref 5.0–8.0)
Protein, ur: 30 mg/dL — AB
RBC / HPF: NONE SEEN RBC/hpf (ref 0–5)
Specific Gravity, Urine: 1.009 (ref 1.005–1.030)

## 2016-10-21 LAB — COMPREHENSIVE METABOLIC PANEL
ALT: 27 U/L (ref 14–54)
ANION GAP: 13 (ref 5–15)
AST: 31 U/L (ref 15–41)
Albumin: 4.4 g/dL (ref 3.5–5.0)
Alkaline Phosphatase: 49 U/L (ref 38–126)
BUN: 22 mg/dL — ABNORMAL HIGH (ref 6–20)
CHLORIDE: 97 mmol/L — AB (ref 101–111)
CO2: 25 mmol/L (ref 22–32)
Calcium: 9.2 mg/dL (ref 8.9–10.3)
Creatinine, Ser: 1.41 mg/dL — ABNORMAL HIGH (ref 0.44–1.00)
GFR calc non Af Amer: 37 mL/min — ABNORMAL LOW (ref >60)
GFR, EST AFRICAN AMERICAN: 43 mL/min — AB (ref >60)
Glucose, Bld: 254 mg/dL — ABNORMAL HIGH (ref 65–99)
Potassium: 4.6 mmol/L (ref 3.5–5.1)
SODIUM: 135 mmol/L (ref 135–145)
Total Bilirubin: 0.5 mg/dL (ref 0.3–1.2)
Total Protein: 7.9 g/dL (ref 6.5–8.1)

## 2016-10-21 LAB — CBC
HCT: 39.1 % (ref 36.0–46.0)
HCT: 37 % (ref 36.0–46.0)
HEMOGLOBIN: 12.1 g/dL (ref 12.0–15.0)
Hemoglobin: 12.8 g/dL (ref 12.0–15.0)
MCH: 28.6 pg (ref 26.0–34.0)
MCH: 28.2 pg (ref 26.0–34.0)
MCHC: 32.7 g/dL (ref 30.0–36.0)
MCHC: 32.7 g/dL (ref 30.0–36.0)
MCV: 87.5 fL (ref 78.0–100.0)
MCV: 86.2 fL (ref 78.0–100.0)
PLATELETS: 281 10*3/uL (ref 150–400)
PLATELETS: 274 10*3/uL (ref 150–400)
RBC: 4.47 MIL/uL (ref 3.87–5.11)
RBC: 4.29 MIL/uL (ref 3.87–5.11)
RDW: 14.8 % (ref 11.5–15.5)
RDW: 14.8 % (ref 11.5–15.5)
WBC: 11.9 10*3/uL — AB (ref 4.0–10.5)
WBC: 11.4 10*3/uL — AB (ref 4.0–10.5)

## 2016-10-21 LAB — I-STAT CHEM 8, ED
BUN: 27 mg/dL — ABNORMAL HIGH (ref 6–20)
Calcium, Ion: 1.04 mmol/L — ABNORMAL LOW (ref 1.15–1.40)
Chloride: 100 mmol/L — ABNORMAL LOW (ref 101–111)
Creatinine, Ser: 1.3 mg/dL — ABNORMAL HIGH (ref 0.44–1.00)
Glucose, Bld: 259 mg/dL — ABNORMAL HIGH (ref 65–99)
HEMATOCRIT: 39 % (ref 36.0–46.0)
HEMOGLOBIN: 13.3 g/dL (ref 12.0–15.0)
POTASSIUM: 4.7 mmol/L (ref 3.5–5.1)
Sodium: 137 mmol/L (ref 135–145)
TCO2: 27 mmol/L (ref 0–100)

## 2016-10-21 LAB — CREATININE, SERUM
Creatinine, Ser: 1.33 mg/dL — ABNORMAL HIGH (ref 0.44–1.00)
GFR calc Af Amer: 46 mL/min — ABNORMAL LOW (ref >60)
GFR calc non Af Amer: 40 mL/min — ABNORMAL LOW (ref >60)

## 2016-10-21 LAB — PROTIME-INR
INR: 1
PROTHROMBIN TIME: 13.2 s (ref 11.4–15.2)

## 2016-10-21 LAB — DIFFERENTIAL
Basophils Absolute: 0 10*3/uL (ref 0.0–0.1)
Basophils Relative: 0 %
EOS PCT: 3 %
Eosinophils Absolute: 0.4 10*3/uL (ref 0.0–0.7)
Lymphocytes Relative: 14 %
Lymphs Abs: 1.6 10*3/uL (ref 0.7–4.0)
MONO ABS: 0.8 10*3/uL (ref 0.1–1.0)
Monocytes Relative: 7 %
NEUTROS ABS: 9.1 10*3/uL — AB (ref 1.7–7.7)
Neutrophils Relative %: 76 %

## 2016-10-21 LAB — GLUCOSE, CAPILLARY
GLUCOSE-CAPILLARY: 260 mg/dL — AB (ref 65–99)
Glucose-Capillary: 282 mg/dL — ABNORMAL HIGH (ref 65–99)
Glucose-Capillary: 231 mg/dL — ABNORMAL HIGH (ref 65–99)

## 2016-10-21 LAB — I-STAT TROPONIN, ED: Troponin i, poc: 0.03 ng/mL (ref 0.00–0.08)

## 2016-10-21 LAB — APTT: aPTT: 25 seconds (ref 24–36)

## 2016-10-21 LAB — CBG MONITORING, ED: GLUCOSE-CAPILLARY: 279 mg/dL — AB (ref 65–99)

## 2016-10-21 LAB — MRSA PCR SCREENING: MRSA by PCR: NEGATIVE

## 2016-10-21 MED ORDER — HYDRALAZINE HCL 20 MG/ML IJ SOLN
5.0000 mg | Freq: Once | INTRAMUSCULAR | Status: DC
  Filled 2016-10-21: qty 1

## 2016-10-21 MED ORDER — RESOURCE THICKENUP CLEAR PO POWD
ORAL | Status: DC | PRN
Start: 2016-10-21 — End: 2016-10-27
  Filled 2016-10-21: qty 125

## 2016-10-21 MED ORDER — HEPARIN SODIUM (PORCINE) 5000 UNIT/ML IJ SOLN
5000.0000 [IU] | Freq: Three times a day (TID) | INTRAMUSCULAR | Status: DC
  Administered 2016-10-21 – 2016-10-27 (×18): 5000 [IU] via SUBCUTANEOUS
  Filled 2016-10-21 (×18): qty 1

## 2016-10-21 MED ORDER — ATORVASTATIN CALCIUM 20 MG PO TABS: 20.0000 mg | ORAL_TABLET | Freq: Every day | ORAL | Status: DC

## 2016-10-21 MED ORDER — CYCLOBENZAPRINE HCL 10 MG PO TABS
5.0000 mg | ORAL_TABLET | Freq: Three times a day (TID) | ORAL | Status: DC | PRN
Start: 2016-10-21 — End: 2016-10-21

## 2016-10-21 MED ORDER — ASPIRIN 300 MG RE SUPP
300.0000 mg | Freq: Every day | RECTAL | Status: DC
  Administered 2016-10-21: 300 mg via RECTAL
  Filled 2016-10-21: qty 1

## 2016-10-21 MED ORDER — POLYETHYLENE GLYCOL 3350 17 G PO PACK: 17.0000 g | PACK | Freq: Every day | ORAL | Status: DC | PRN

## 2016-10-21 MED ORDER — CLOPIDOGREL BISULFATE 75 MG PO TABS: 75.0000 mg | ORAL_TABLET | Freq: Every day | ORAL | Status: DC

## 2016-10-21 MED ORDER — ACETAMINOPHEN 325 MG PO TABS
650.0000 mg | ORAL_TABLET | Freq: Four times a day (QID) | ORAL | Status: DC | PRN
  Administered 2016-10-21 (×2): 650 mg via ORAL
  Administered 2016-10-22: 325 mg via ORAL
  Administered 2016-10-22 (×2): 650 mg via ORAL
  Administered 2016-10-22: 325 mg via ORAL
  Administered 2016-10-23 – 2016-10-27 (×7): 650 mg via ORAL
  Filled 2016-10-21 (×12): qty 2

## 2016-10-21 MED ORDER — LOSARTAN POTASSIUM 50 MG PO TABS: 100.0000 mg | ORAL_TABLET | Freq: Every day | ORAL | Status: DC

## 2016-10-21 MED ORDER — NICARDIPINE HCL IN NACL 20-0.86 MG/200ML-% IV SOLN
0.0000 mg/h | INTRAVENOUS | Status: DC
  Administered 2016-10-21: 5 mg/h via INTRAVENOUS
  Filled 2016-10-21: qty 200

## 2016-10-21 MED ORDER — ALBUTEROL SULFATE (2.5 MG/3ML) 0.083% IN NEBU
3.0000 mL | INHALATION_SOLUTION | Freq: Four times a day (QID) | RESPIRATORY_TRACT | Status: DC | PRN
  Administered 2016-10-21: 3 mL via RESPIRATORY_TRACT
  Filled 2016-10-21: qty 3

## 2016-10-21 MED ORDER — STROKE: EARLY STAGES OF RECOVERY BOOK
Freq: Once | Status: AC
  Administered 2016-10-21: 08:00:00
  Filled 2016-10-21: qty 1

## 2016-10-21 MED ORDER — HYDRALAZINE HCL 20 MG/ML IJ SOLN
5.0000 mg | Freq: Once | INTRAMUSCULAR | Status: AC
  Administered 2016-10-21: 5 mg via INTRAVENOUS
  Filled 2016-10-21: qty 1

## 2016-10-21 MED ORDER — ACETAMINOPHEN 650 MG RE SUPP: 650.0000 mg | Freq: Four times a day (QID) | RECTAL | Status: DC | PRN

## 2016-10-21 MED ORDER — METOPROLOL SUCCINATE ER 100 MG PO TB24: 100.0000 mg | ORAL_TABLET | Freq: Two times a day (BID) | ORAL | Status: DC

## 2016-10-21 MED ORDER — DIAZEPAM 5 MG PO TABS: 2.5000 mg | ORAL_TABLET | Freq: Two times a day (BID) | ORAL | Status: DC | PRN

## 2016-10-21 MED ORDER — HYDRALAZINE HCL 20 MG/ML IJ SOLN
5.0000 mg | INTRAMUSCULAR | Status: DC | PRN
  Administered 2016-10-21 – 2016-10-26 (×8): 5 mg via INTRAVENOUS
  Filled 2016-10-21 (×8): qty 1

## 2016-10-21 MED ORDER — GABAPENTIN 100 MG PO CAPS: 100.0000 mg | ORAL_CAPSULE | Freq: Three times a day (TID) | ORAL | Status: DC | PRN

## 2016-10-21 MED ORDER — ONDANSETRON HCL 4 MG/2ML IJ SOLN: 4.0000 mg | Freq: Four times a day (QID) | INTRAMUSCULAR | Status: DC | PRN

## 2016-10-21 MED ORDER — SODIUM CHLORIDE 0.9 % IV SOLN
INTRAVENOUS | Status: DC
  Administered 2016-10-22 – 2016-10-23 (×2): via INTRAVENOUS

## 2016-10-21 MED ORDER — INSULIN ASPART 100 UNIT/ML ~~LOC~~ SOLN
0.0000 [IU] | Freq: Every day | SUBCUTANEOUS | Status: DC
  Administered 2016-10-21: 3 [IU] via SUBCUTANEOUS
  Administered 2016-10-22: 4 [IU] via SUBCUTANEOUS

## 2016-10-21 MED ORDER — TRAZODONE HCL 50 MG PO TABS: 25.0000 mg | ORAL_TABLET | Freq: Every evening | ORAL | Status: DC | PRN

## 2016-10-21 MED ORDER — PANCRELIPASE (LIP-PROT-AMYL) 12000-38000 UNITS PO CPEP
36000.0000 [IU] | ORAL_CAPSULE | Freq: Three times a day (TID) | ORAL | Status: DC
  Administered 2016-10-21 – 2016-10-27 (×16): 36000 [IU] via ORAL
  Filled 2016-10-21: qty 1
  Filled 2016-10-21 (×3): qty 3
  Filled 2016-10-21 (×3): qty 1
  Filled 2016-10-21: qty 3
  Filled 2016-10-21 (×2): qty 1
  Filled 2016-10-21: qty 3
  Filled 2016-10-21 (×2): qty 1
  Filled 2016-10-21: qty 3
  Filled 2016-10-21 (×2): qty 1
  Filled 2016-10-21 (×2): qty 3
  Filled 2016-10-21: qty 1

## 2016-10-21 MED ORDER — STARCH (THICKENING) PO POWD
ORAL | Status: DC | PRN
  Filled 2016-10-21: qty 227

## 2016-10-21 MED ORDER — ASPIRIN 81 MG PO CHEW: 81.0000 mg | CHEWABLE_TABLET | Freq: Every day | ORAL | Status: DC

## 2016-10-21 MED ORDER — BENZONATATE 100 MG PO CAPS: 200.0000 mg | ORAL_CAPSULE | Freq: Three times a day (TID) | ORAL | Status: DC | PRN

## 2016-10-21 MED ORDER — LORAZEPAM 2 MG/ML IJ SOLN
0.5000 mg | Freq: Once | INTRAMUSCULAR | Status: DC
  Filled 2016-10-21: qty 1

## 2016-10-21 MED ORDER — METOPROLOL TARTRATE 5 MG/5ML IV SOLN
10.0000 mg | Freq: Four times a day (QID) | INTRAVENOUS | Status: DC
  Administered 2016-10-21 – 2016-10-23 (×8): 10 mg via INTRAVENOUS
  Filled 2016-10-21 (×8): qty 10

## 2016-10-21 MED ORDER — HYDRALAZINE HCL 20 MG/ML IJ SOLN: 10.0000 mg | Freq: Once | INTRAMUSCULAR | Status: DC

## 2016-10-21 MED ORDER — INSULIN ASPART 100 UNIT/ML ~~LOC~~ SOLN
0.0000 [IU] | Freq: Three times a day (TID) | SUBCUTANEOUS | Status: DC
  Administered 2016-10-21: 5 [IU] via SUBCUTANEOUS
  Administered 2016-10-21: 8 [IU] via SUBCUTANEOUS
  Administered 2016-10-22: 11 [IU] via SUBCUTANEOUS
  Administered 2016-10-22: 5 [IU] via SUBCUTANEOUS
  Administered 2016-10-22: 11 [IU] via SUBCUTANEOUS
  Administered 2016-10-23 (×2): 8 [IU] via SUBCUTANEOUS

## 2016-10-21 MED ORDER — ONDANSETRON HCL 4 MG PO TABS: 4.0000 mg | ORAL_TABLET | Freq: Four times a day (QID) | ORAL | Status: DC | PRN

## 2016-10-21 NOTE — Progress Notes (Signed)
STROKE TEAM PROGRESS NOTE   HISTORY OF PRESENT ILLNESS (per record) Jamie KaufmannLinda Krienke is an 71 y.o. female who presented with left arm and leg weakness, which patient initially stated began on Friday at 1500. However, she endorsed varying number of days of stuttering symptoms to the ED physician, making history unreliable. SBP was > 200 with EMS and upon arrival to ED.   She endorses history of 2 prior strokes 15 years ago occurring 9 months apart, the first with right sided weakness that later resolved. She is vague and nonspecific regarding the symptoms that she had with her second stroke. 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  She initially refused CT when this was recommended to her by the ED attending.   Her PMHx also includes chronic bronchitis, hearing loss, CKD, cluster headaches, DM2, HLD and HTN.   Home medications include ASA, Plavix and Lipitor.    SUBJECTIVE (INTERVAL HISTORY) No family is at the bedside.  She still has dysarthria, left facial droop, left hemiplegia with increased tone. He complains joint pain all over. She refused MRI due to claustrophobia. She refused CTA with contrast due to previous allergy-like reactions.    OBJECTIVE Temp:  [97.3 F (36.3 C)-98.7 F (37.1 C)] 97.8 F (36.6 C) (03/11 0856) Pulse Rate:  [67-101] 88 (03/11 1000) Cardiac Rhythm: (P) Normal sinus rhythm (03/11 0800) Resp:  [14-30] 21 (03/11 1000) BP: (139-228)/(53-105) 174/87 (03/11 0856) SpO2:  [92 %-98 %] 95 % (03/11 1000) Weight:  [106.4 kg (234 lb 9.1 oz)] 106.4 kg (234 lb 9.1 oz) (03/11 0315)  CBC:   Recent Labs Lab 10/21/16 0254  10/21/16 0854 10/22/16 0333  WBC 11.9*  --  11.4* 11.8*  NEUTROABS 9.1*  --   --   --   HGB 12.8  < > 12.1 12.9  HCT 39.1  < > 37.0 39.0  MCV 87.5  --  86.2 86.5  PLT 281  --  274 324  < > = values in this interval not displayed.  Basic Metabolic Panel:   Recent Labs Lab 10/21/16 0254 10/21/16 0259  10/21/16 0854 10/22/16 0333  NA 135 137  --  135  K 4.6 4.7  --  3.9  CL 97* 100*  --  98*  CO2 25  --   --  22  GLUCOSE 254* 259*  --  274*  BUN 22* 27*  --  23*  CREATININE 1.41* 1.30* 1.33* 1.37*  CALCIUM 9.2  --   --  9.4    Lipid Panel:     Component Value Date/Time   CHOL 186 10/22/2016 0333   TRIG 241 (H) 10/22/2016 0333   HDL 51 10/22/2016 0333   CHOLHDL 3.6 10/22/2016 0333   VLDL 48 (H) 10/22/2016 0333   LDLCALC 87 10/22/2016 0333   HgbA1c:  Lab Results  Component Value Date   HGBA1C 9.3 (H) 10/21/2016   Urine Drug Screen: No results found for: LABOPIA, COCAINSCRNUR, LABBENZ, AMPHETMU, THCU, LABBARB    IMAGING I have personally reviewed the radiological images below and agree with the radiology interpretations.  Ct Head Wo Contrast 10/21/2016 Suspicious for acute infarction in the left MCA territory, nonhemorrhagic. No mass effect or midline shift. Moderate atrophy and chronic microvascular disease, with remote lacunar infarctions and remote right pons infarction.   CUS - Bilateral: 1-39% ICA stenosis. Vertebral artery flow is antegrade.  TTE - Left ventricle: Systolic function was normal. The estimated  ejection fraction was in the range of 55% to 60%. Images were   inadequate for LV wall motion assessment. Impressions: - Would recommend repeating an echocardiogram with contrast if   deemed clinically indicated.  TCD pending  CT head repeat pending    PHYSICAL EXAM  Temp:  [97.3 F (36.3 C)-98.7 F (37.1 C)] 97.6 F (36.4 C) (03/11 1234) Pulse Rate:  [67-93] 83 (03/11 1450) Resp:  [14-30] 17 (03/11 1400) BP: (139-229)/(53-105) 184/75 (03/11 1450) SpO2:  [92 %-97 %] 97 % (03/11 1450) Weight:  [234 lb 9.1 oz (106.4 kg)] 234 lb 9.1 oz (106.4 kg) (03/11 0315)  General - morbid obesity, well developed, mild distress due to joint pain all over.  Ophthalmologic - Fundi not visualized due to noncooperation.  Cardiovascular - Regular rate and  rhythm.  Mental Status -  Level of arousal and orientation to time, place, and person were intact. Language including expression, naming, repetition, comprehension was assessed and found intact, mild to moderate dysarthria Fund of Knowledge was assessed and was intact.  Cranial Nerves II - XII - II - Visual field intact OU. III, IV, VI - Extraocular movements intact. V - Facial sensation intact bilaterally. VII - left facial droop. VIII - Hearing & vestibular intact bilaterally. X - Palate elevates symmetrically, mild to moderate dysarthria. XI - Chin turning & shoulder shrug intact bilaterally. XII - Tongue protrusion intact.  Motor Strength - The patient's strength was normal in RUE and RLE, however, 0/5 LUE and LLE.  Bulk was normal and fasciculations were absent.   Motor Tone - Muscle tone was assessed at the neck and appendages and was significantly increased on the left.  Reflexes - The patient's reflexes were 1+ in all extremities and she had positive left Babinski and triple reflex.  Sensory - Light touch, temperature/pinprick were assessed and were symmetrical.    Coordination - The patient had normal movements in the right hand with no ataxia or dysmetria.  Tremor was absent.  Gait and Station - deferred.   ASSESSMENT/PLAN Ms. Shanequa Whitenight is a 71 y.o. female with history of a previous strokes, diabetes mellitus, hearing loss, hypertension, hyperlipidemia, cluster headaches, chronic kidney disease, chronic bronchitis, and asthma presenting with left-sided weakness and elevated blood pressure. She did not receive IV t-PA due to late presentation.  Stroke:  Possible acute right subcortical infarct in the left MCA territory - possible small vessel disease source due to stroke risk factors including obesity, HLD, and uncontrolled HTN and DM.  Resultant: Left facial droop, left hemiplegia, increased tone on the left  MRI /MRA - not performed - pt refused -  claustrophobic  CT Head - no acute abnormalities  CTA head and neck - pt refused due to self claimed allergy-like reaction  Repeat CT head - pending  Carotid Doppler unremarkable.   2D Echo - EF 55-60%  TCD pending  LDL - 87  HgbA1c - 9.3  VTE prophylaxis - subcutaneous heparin DIET DYS 2 Room service appropriate? Yes; Fluid consistency: Nectar Thick  aspirin 81 mg daily and clopidogrel 75 mg daily prior to admission, now on ASA 325mg  and plavix. Continue DATP for 3 months and then plavix alone.   Patient counseled to be compliant with her antithrombotic medications  Ongoing aggressive stroke risk factor management  Therapy recommendations: pending  Disposition: Pending  Hx of stroke  Left PCA infarct in 2007 - MRA showed left PCA P2 occlusion  02/2015 - intermittent left jaw and right foot numbness, lasting  30 minutes each - A1c 7.2 and LDL 50 - followed with Dr. Allena [facility] at [facility] - on aspirin Plavix and Lipitor  Hypertension / hypertensive urgency   Blood pressure running high  Permissive hypertension (OK if < 220/120) but gradually normalize in 5-7 days  Long-term BP goal normotensive  BP high over night, transferred to stepdown, on Cardene drip when necessary    on home meds - metoprolol IV 10 mg q6, and losartan 100 mg daily  Hyperlipidemia  Home meds: Lipitor 20 mg daily prior to admission not resumed in hospital  LDL 87, goal < 70  Add Lipitor 40 mg daily   Continue statin at discharge  Diabetes  HgbA1c 9.3, goal < 7.0  Uncontrolled  SSI    CBG monitoring  Other Stroke Risk Factors  Advanced age  Former smoker  Obesity, Body mass index is 42.9 kg/m., recommend weight loss, diet and exercise as appropriate   Family hx stroke (other)  Other Active Problems  CKD - creatinine 1.37 ; BUN 23  Mild leukocytosis - 11.8 (afebrile)  Follows with Dr. Allena Katz at [facility] day # 0  Marvel Plan, MD PhD Stroke Neurology 10/22/2016 4:39  PM   To contact Stroke Continuity provider, please refer to [email or web address]. After hours, contact General Neurology

## 2016-10-21 NOTE — H&P (Signed)
History and Physical    Jamie Aguirre ZOX:[id number] DOB: March 13, 1946 DOA: 10/21/2016  PCP: Myrlene Broker, MD Patient coming from:   Chief Complaint: left sided weakness   HPI: Jamie Aguirre is a 71 y.o. female with medical history significant for two prior strokes (approximately 15 years ago), CKD, HTN, HLD,  who presents to the Emergency Department via EMS after she developed of sudden-onset, constant left-sided arm weakness that began yesterday at 3 PM. Patient stated that  she has been having stroke-like symptoms intermittently for 2-3 weeks now that associated with slurred speech and facial droop. She is on antiplatelet therapy with  Plavix. She was seen by neurology and admission with stroke work up was recommended.  Patient states if she has a CT she will develop vertigo.  ED Course: Initially on arrival patient had hypertensive emergency with BP 240/100 mmHg and Cardene drip was initiated and plan is to transition to hydralazine.Marland Kitchen She was refusing CT scan saying that this makes her feel very dizzy. Eventually she underwent CT on 10/21/2016 and scan was suspicious for acute infarction in the left MCA territory, chronic small vessel disease with old lacunar infarctions and remote right pons infarction Blood work demonstrated mild elevation in creatinine 14.30 and mild leucocytosis - 11,900  EKG showed SR without acute changes  Review of Systems: Patient denies any right sided weakness, numbness, or tingling at this time. She denied chest pain, SOB, palpitations.  As per HPI otherwise 10 point review of systems negative.   Ambulatory Status: unknown  Past Medical History:  Diagnosis Date  . Allergic rhinitis, seasonal    assocaited with vertigo sensation  . Arthritis   . Asthma   . Chronic bronchitis   . Chronic kidney disease   . Diverticulitis   . Headaches, cluster   . Hives    idiopathic  . Hyperlipidemia   . Hypertension   . Pellucid marginal degeneration of  cornea   . 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 Type 2 diabetes mellitus (HCC)     Past Surgical History:  Procedure Laterality Date  . CATARACT EXTRACTION W/ INTRAOCULAR LENS IMPLANT Left 02/04/13   Dr Valere Dross    Social History   Social History  . Marital status: Married    Spouse name: N/A  . Number of children: N/A  . Years of education: N/A   Occupational History  . Not on file.   Social History Main Topics  . Smoking status: Former Smoker    Packs/day: 3.50    Years: 20.00    Types: Cigarettes    Quit date: 08/15/1975  . Smokeless tobacco: Never Used  . Alcohol use No  . Drug use: No  . Sexual activity: Not Currently   Other Topics Concern  . 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 Penicillins Anaphylaxis  . Ciprofloxacin Other (See Comments)    Vertigo SE  . Codeine     Codeine family  . Latex Dermatitis  . Sulfa Antibiotics     Family History  Problem Relation Age of Onset  . Heart disease Father   . Arthritis Other   . Stroke Other   . Hypertension Other   . Hyperlipidemia Other   . Diabetes Other     Prior to Admission medications  Medication Sig Start Date End Date Taking? Authorizing Provider  albuterol (VENTOLIN HFA) 108 (90 BASE) MCG/ACT inhaler Inhale 2 puffs into the lungs every 6 (six) hours as needed. 08/17/14   Myrlene Broker, MD  aspirin 81 MG tablet Take 81 mg by mouth daily.     Historical Provider, MD  atorvastatin (LIPITOR) 20 MG tablet TAKE 1 TABLET BY MOUTH EVERY DAY 09/04/16   Myrlene Broker, MD  baclofen (LIORESAL) 10 MG tablet Take 1 tablet (10 mg total) by mouth 3 (three) times daily as needed for muscle spasms. 07/22/15   Myrlene Broker, MD  benzonatate (TESSALON) 200 MG capsule Take 1 capsule (200 mg total) by mouth 3 (three) times daily as needed  for cough. 08/15/16   Myrlene Broker, MD  clopidogrel (PLAVIX) 75 MG tablet TAKE 1 TABLET BY MOUTH ONCE DAILY 08/27/15   Myrlene Broker, MD  cyclobenzaprine (FLEXERIL) 5 MG tablet Take 1 tablet (5 mg total) by mouth every 8 (eight) hours as needed for muscle spasms. 07/13/15 10/07/18  Myrlene Broker, MD  diazepam (VALIUM) 5 MG tablet Take 0.5-1 tablets (2.5-5 mg total) by mouth every 12 (twelve) hours as needed (vertigo symptoms). 10/09/12   Newt Lukes, MD  diphenhydrAMINE (BENADRYL) 25 MG tablet Take 25 mg by mouth at bedtime.    Historical Provider, MD  gabapentin (NEURONTIN) 100 MG capsule One pill every eight hours as needed 01/06/15   Pecola Lawless, MD  JENTADUETO 2.12-998 MG TABS TAKE 1 TABLET BY MOUTH TWICE DAILY 12/24/15   Myrlene Broker, MD  lipase/protease/amylase (CREON) 36000 UNITS CPEP capsule Take 1 capsule (36,000 Units total) by mouth 3 (three) times daily before meals. 07/27/16   Myrlene Broker, MD  loratadine (CLARITIN) 10 MG tablet Take 10 mg by mouth every morning.    Historical Provider, MD  losartan (COZAAR) 100 MG tablet TAKE 1 TABLET BY MOUTH EVERY DAY 09/04/16   Myrlene Broker, MD  metoprolol succinate (TOPROL-XL) 100 MG 24 hr tablet TAKE 1 TABLET BY MOUTH TWICE A DAY 09/04/16   Myrlene Broker, MD  silver sulfADIAZINE (SILVADENE) 1 % cream Apply 1 application topically as needed.  08/26/14   Historical Provider, MD  triamcinolone cream (KENALOG) 0.1 % Apply 1 application topically 2 (two) times daily as needed. 02/28/16   Myrlene Broker, MD    Physical Exam: Vitals:   10/21/16 0700 10/21/16 0715 10/21/16 0730 10/21/16 0745  BP: 163/100 179/86 171/75 172/70  Pulse: 86 88 89 86  Resp: 26 20 25 24   Temp:      TempSrc:      SpO2: 95% 96% 95% 93%     General: Appears calm and comfortable Eyes: PERRLA, EOMI, normal lids, iris ENT:  Diminished hearing, lips & tongue, mucous membranes dry and intact, right facial  droop Neck: no lymphoadenopathy, masses or thyromegaly Cardiovascular: RRR, no m/r/g. No JVD, carotid bruits. No LE edema.  Respiratory: bilateral no wheezes, rales, rhonchi or cracles. Normal respiratory effort. No accessory muscle use observed Abdomen: soft, non-tender, non-distended, no organomegaly or masses appreciated. BS present in all quadrants Skin: no rash, ulcers or induration seen on limited exam Musculoskeletal: grossly normal tone BUE/BLE, good ROM, no bony abnormality or joint deformities observed Psychiatric: grossly normal mood and affect, speech fluent and appropriate, alert and oriented  Neurologic: CN II-XII grossly intact, severe weakness of the left arm with loss of grip and LLE. Right rm and right leg have intact sensation  and motor strength  Labs on Admission: I have personally reviewed following labs and imaging studies  CBC, BMP  GFR: CrCl cannot be calculated (Unknown ideal weight.).   Creatinine Clearance: CrCl cannot be calculated (Unknown ideal weight.).    Radiological Exams on Admission: Ct Head Wo Contrast  Addendum Date: 10/21/2016   ADDENDUM REPORT: 10/21/2016 08:39 ADDENDUM: I was asked by the admitting team to review this case at 8:18 am on 10/21/2016. No changes of acute cortically based infarct are identified. There is widespread patchy cerebral white matter hypodensity. There is a chronic appearing lacunar infarct in the right paracentral pons. Dr. Clovis RileyMitchell was suspicious of hyperdense left MCA. There is extensive Calcified atherosclerosis at the skull base. To me the MCA M1 segments appear symmetric. CONCLUSION: No acute cortically based infarct identified by CT. Changes of advanced chronic small vessel ischemia. This was discussed by telephone with Dr. Dierdre Searlesawn Langeland On 10/21/2016 at 0838 hours. Electronically Signed   By: Odessa FlemingH  Hall M.D.   On: 10/21/2016 08:39   Result Date: 10/21/2016 CLINICAL DATA:  Left upper extremity weakness, onset yesterday  afternoon EXAM: CT HEAD WITHOUT CONTRAST TECHNIQUE: Contiguous axial images were obtained from the base of the skull through the vertex without intravenous contrast. COMPARISON:  08/28/2007 FINDINGS: Brain: There is no intracranial hemorrhage, mass or mass effect. Probable remote lacunar infarction of the right caudate and right putamen. Remote right pons infarction. There is mild generalized atrophy. There is moderate chronic microvascular ischemic change. There is no significant extra-axial fluid collection. Vascular: Hyperdense left MCA. Skull: Normal. Negative for fracture or focal lesion. Sinuses/Orbits: No acute finding. IMPRESSION: Suspicious for acute infarction in the left MCA territory, nonhemorrhagic. No mass effect or midline shift. Moderate atrophy and chronic microvascular disease, with remote lacunar infarctions and remote right pons infarction. These results will be called to the ordering clinician or representative by the Radiologist Assistant, and communication documented in the PACS or zVision Dashboard. Electronically Signed: By: Ellery Plunkaniel R Mitchell M.D. On: 10/21/2016 06:35    EKG: Independently reviewed - SR without acute abnormality  Assessment/Plan Principal Problem:   Acute ischemic left MCA stroke (HCC) Active Problems:   Type 2 diabetes mellitus (HCC)   HTN (hypertension)   Hyperlipidemia   OSA (obstructive sleep apnea)   Hypertensive emergency   Stroke [facility](HCC)   Urinary frequency   Left hip pain     Acute Right MCA ischemic stroke - neurology on board and wil follow their recommendations PT /OT to see patient Might need placement for rehab as patient lives alone; will ask CM to see patient Patient is refusing brain MRI d/t severe claustrophobia, CT was done after premedication with Lorazepam  HTN with hypertensive emergency - BP 240/110 mmHg Patient was placed omn Cardene drip and now her BP is better controlled with Hydralazine IV prn for SBP > 185 mmHg Will start  home meds and monitor BP  Hyperlipidemia Continue statin therapy Refresh FLP  DM type II - last Hgb A1C was 10.2% Refresh Hgb 1Ac, hold oral meds Continue CBG's monitoring and SSI  AKI - most likely d/t dehydration Continue to monitor renal indices  OSA - place  On CPAP while inpatient  Left hip pain and inability to bear weight Will order Xray to r/o misalignment or small fracture  Urinary frequency Will order catheterized UA to r/o UTI    DVT prophylaxis: heparin Code Status: full Family Communication: none Disposition Plan: SDU Consults called: neurology Admission status: observation   Raymon MuttonMarina Yedidya Duddy,  PA-C Pager: [phone] [facility]  If 7PM-7AM, please contact night-coverage [email or web address] Password [facility]  10/21/2016, 8:48 AM

## 2016-10-21 NOTE — ED Provider Notes (Signed)
[facility]-EMERGENCY DEPT Provider Note   CSN: [id number] Arrival date & time: 10/21/16  0209  By signing my name below, I, Cynda Acres, attest that this documentation has been prepared under the direction and in the presence of Airrion Otting, MD. Electronically Signed: Cynda Acres, Scribe. 10/21/16. 3:29 AM.   History   Chief Complaint Chief Complaint  Patient presents with  . Weakness    HPI Comments:  Jamie Aguirre is a 71 y.o. female with a history of 2 strokes, CKD, HTN, HLD,  who presents to the Emergency Department via EMS, complaining of sudden-onset, constant left-sided arm weakness that began yesterday at 3 PM. Patient states she has been having stroke-like symptoms for 2-3 weeks now. Patient has associated slurred speech and facial droop. No modifying factors indicated. Patient is on anticoagulation (Plavix). Patient denies any right sided weakness, numbness, or tingling.   Patient states if she has a CT she will develop vertigo. Patient states she does not want a CT and does not care if a neurologist sees her. Patient states she does not care if she has to leave AMA.    The history is provided by the patient. No language interpreter was used.  Weakness  Primary symptoms include no dizziness. This is a new problem. Episode onset: 2-3 weeks ago. The problem has been gradually worsening. There was left lower extremity, left upper extremity and left facial focality noted. There has been no fever.    Past Medical History:  Diagnosis Date  . Allergic rhinitis, seasonal    assocaited with vertigo sensation  . Arthritis   . Asthma   . Chronic bronchitis   . Chronic kidney disease   . Diverticulitis   . Headaches, cluster   . Hives    idiopathic  . Hyperlipidemia   . Hypertension   . Pellucid marginal degeneration of cornea   . 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 Type 2 diabetes mellitus [facility])     Patient Active Problem List   Diagnosis Date Noted  .  Arthritis of left ankle 11/09/2015  . Abnormal PFTs (pulmonary function tests) 07/22/2015  . Chronic venous insufficiency 04/12/2015  . Routine general medical examination at a health care facility 08/27/2014  . Chronic diarrhea of unknown origin 05/15/2014  . Arthritis of right ankle 07/08/2013  . Depressive disorder 10/09/2012  . Vertigo   . OSA (obstructive sleep apnea)   . Obese 01/01/2012  . Type 2 diabetes mellitus (HCC)   . Hypertension   . Hyperlipidemia   . Stroke (HCC)   . Allergic rhinitis, seasonal     Past Surgical History:  Procedure Laterality Date  . CATARACT EXTRACTION W/ INTRAOCULAR LENS IMPLANT Left 02/04/13   Dr Valere Dross    OB History    No data available       Home Medications    Prior to Admission medications   Medication Sig Start Date End Date Taking? Authorizing Provider  albuterol (VENTOLIN HFA) 108 (90 BASE) MCG/ACT inhaler Inhale 2 puffs into the lungs every 6 (six) hours as needed. 08/17/14   Myrlene Broker, MD  aspirin 81 MG tablet Take 81 mg by mouth daily.     Historical Provider, MD  atorvastatin (LIPITOR) 20 MG tablet TAKE 1 TABLET BY MOUTH EVERY DAY 09/04/16   Myrlene Broker, MD  baclofen (LIORESAL) 10 MG tablet Take 1 tablet (10 mg total) by mouth 3 (three) times daily as needed for muscle spasms. 07/22/15   Lanora Manis  Alison Murray, MD  benzonatate (TESSALON) 200 MG capsule Take 1 capsule (200 mg total) by mouth 3 (three) times daily as needed for cough. 08/15/16   Myrlene Broker, MD  clopidogrel (PLAVIX) 75 MG tablet TAKE 1 TABLET BY MOUTH ONCE DAILY 08/27/15   Myrlene Broker, MD  cyclobenzaprine (FLEXERIL) 5 MG tablet Take 1 tablet (5 mg total) by mouth every 8 (eight) hours as needed for muscle spasms. 07/13/15 10/07/18  Myrlene Broker, MD  diazepam (VALIUM) 5 MG tablet Take 0.5-1 tablets (2.5-5 mg total) by mouth every 12 (twelve) hours as needed (vertigo symptoms). 10/09/12   Newt Lukes, MD  diphenhydrAMINE  (BENADRYL) 25 MG tablet Take 25 mg by mouth at bedtime.    Historical Provider, MD  gabapentin (NEURONTIN) 100 MG capsule One pill every eight hours as needed 01/06/15   Pecola Lawless, MD  JENTADUETO 2.12-998 MG TABS TAKE 1 TABLET BY MOUTH TWICE DAILY 12/24/15   Myrlene Broker, MD  lipase/protease/amylase (CREON) 36000 UNITS CPEP capsule Take 1 capsule (36,000 Units total) by mouth 3 (three) times daily before meals. 07/27/16   Myrlene Broker, MD  loratadine (CLARITIN) 10 MG tablet Take 10 mg by mouth every morning.    Historical Provider, MD  losartan (COZAAR) 100 MG tablet TAKE 1 TABLET BY MOUTH EVERY DAY 09/04/16   Myrlene Broker, MD  metoprolol succinate (TOPROL-XL) 100 MG 24 hr tablet TAKE 1 TABLET BY MOUTH TWICE A DAY 09/04/16   Myrlene Broker, MD  silver sulfADIAZINE (SILVADENE) 1 % cream Apply 1 application topically as needed.  08/26/14   Historical Provider, MD  triamcinolone cream (KENALOG) 0.1 % Apply 1 application topically 2 (two) times daily as needed. 02/28/16   Myrlene Broker, MD    Family History Family History  Problem Relation Age of Onset  . Heart disease Father   . Arthritis Other   . Stroke Other   . Hypertension Other   . Hyperlipidemia Other   . Diabetes Other     Social History Social History  Substance Use Topics  . Smoking status: Former Smoker    Packs/day: 3.50    Years: 20.00    Types: Cigarettes    Quit date: 08/15/1975  . Smokeless tobacco: Never Used  . Alcohol use No     Allergies   Penicillins; Ciprofloxacin; Codeine; Latex; and Sulfa antibiotics   Review of Systems Review of Systems  Constitutional: Negative for fever.  Neurological: Positive for facial asymmetry, speech difficulty and weakness (left). Negative for dizziness, syncope and numbness.  All other systems reviewed and are negative.    Physical Exam Updated Vital Signs BP (!) 240/100 (BP Location: Right Arm)   Pulse 75   Temp 98 F (36.7 C)  (Oral)   Resp (!) 27   SpO2 99%   Physical Exam  Constitutional: She is oriented to person, place, and time. She appears well-developed.  HENT:  Head: Normocephalic and atraumatic.  Mouth/Throat: Oropharynx is clear and moist.  Left-sided facial droop.   Eyes: Conjunctivae and EOM are normal. Pupils are equal, round, and reactive to light.  Lens implants in both eyes. Extraoculars intact.   Neck: Normal range of motion. Neck supple.  Cardiovascular: Normal rate and regular rhythm.   Pulmonary/Chest: Effort normal and breath sounds normal. No stridor.  Abdominal: Soft. Bowel sounds are normal.  Musculoskeletal: Normal range of motion.  Neurological: She is alert and oriented to person, place, and time.  Skin: Skin  is warm and dry.  Psychiatric: She has a normal mood and affect.     ED Treatments / Results  DIAGNOSTIC STUDIES: Oxygen Saturation is 99% on RA, normal by my interpretation.    COORDINATION OF CARE: 3:30 AM Discussed treatment plan with pt at bedside and pt agreed to plan.  Labs (all labs ordered are listed, but only abnormal results are displayed) Labs Reviewed  PROTIME-INR  APTT  CBC  DIFFERENTIAL  COMPREHENSIVE METABOLIC PANEL  I-STAT TROPOININ, ED  CBG MONITORING, ED  I-STAT CHEM 8, ED    EKG  EKG Interpretation  Date/Time:  Saturday October 21 2016 02:19:40 EST Ventricular Rate:  76 PR Interval:    QRS Duration: 90 QT Interval:  406 QTC Calculation: 457 R Axis:   -40 Text Interpretation:  Sinus rhythm Confirmed by [facility]  MD, Morene AntuAPRIL ([id number]) on 10/21/2016 2:24:46 AM       Radiology Results for orders placed or performed during the hospital encounter of 10/21/16  Protime-INR  Result Value Ref Range   Prothrombin Time 13.2 11.4 - 15.2 seconds   INR 1.00   APTT  Result Value Ref Range   aPTT 25 24 - 36 seconds  CBC  Result Value Ref Range   WBC 11.9 (H) 4.0 - 10.5 K/uL   RBC 4.47 3.87 - 5.11 MIL/uL   Hemoglobin 12.8 12.0 - 15.0 g/dL    HCT 24.439.1 01.036.0 - 27.246.0 %   MCV 87.5 78.0 - 100.0 fL   MCH 28.6 26.0 - 34.0 pg   MCHC 32.7 30.0 - 36.0 g/dL   RDW 53.614.8 64.411.5 - 03.415.5 %   Platelets 281 150 - 400 K/uL  Differential  Result Value Ref Range   Neutrophils Relative % 76 %   Neutro Abs 9.1 (H) 1.7 - 7.7 K/uL   Lymphocytes Relative 14 %   Lymphs Abs 1.6 0.7 - 4.0 K/uL   Monocytes Relative 7 %   Monocytes Absolute 0.8 0.1 - 1.0 K/uL   Eosinophils Relative 3 %   Eosinophils Absolute 0.4 0.0 - 0.7 K/uL   Basophils Relative 0 %   Basophils Absolute 0.0 0.0 - 0.1 K/uL  Comprehensive metabolic panel  Result Value Ref Range   Sodium 135 135 - 145 mmol/L   Potassium 4.6 3.5 - 5.1 mmol/L   Chloride 97 (L) 101 - 111 mmol/L   CO2 25 22 - 32 mmol/L   Glucose, Bld 254 (H) 65 - 99 mg/dL   BUN 22 (H) 6 - 20 mg/dL   Creatinine, Ser 7.421.41 (H) 0.44 - 1.00 mg/dL   Calcium 9.2 8.9 - 59.510.3 mg/dL   Total Protein 7.9 6.5 - 8.1 g/dL   Albumin 4.4 3.5 - 5.0 g/dL   AST 31 15 - 41 U/L   ALT 27 14 - 54 U/L   Alkaline Phosphatase 49 38 - 126 U/L   Total Bilirubin 0.5 0.3 - 1.2 mg/dL   GFR calc non Af Amer 37 (L) >60 mL/min   GFR calc Af Amer 43 (L) >60 mL/min   Anion gap 13 5 - 15  I-stat troponin, ED  Result Value Ref Range   Troponin i, poc 0.03 0.00 - 0.08 ng/mL   Comment 3          I-Stat Chem 8, ED  Result Value Ref Range   Sodium 137 135 - 145 mmol/L   Potassium 4.7 3.5 - 5.1 mmol/L   Chloride 100 (L) 101 - 111 mmol/L   BUN 27 (H)  6 - 20 mg/dL   Creatinine, Ser 1.61 (H) 0.44 - 1.00 mg/dL   Glucose, Bld 096 (H) 65 - 99 mg/dL   Calcium, Ion 0.45 (L) 1.15 - 1.40 mmol/L   TCO2 27 0 - 100 mmol/L   Hemoglobin 13.3 12.0 - 15.0 g/dL   HCT 40.9 81.1 - 91.4 %   Ct Head Wo Contrast  Result Date: 10/21/2016 CLINICAL DATA:  Left upper extremity weakness, onset yesterday afternoon EXAM: CT HEAD WITHOUT CONTRAST TECHNIQUE: Contiguous axial images were obtained from the base of the skull through the vertex without intravenous contrast.  COMPARISON:  08/28/2007 FINDINGS: Brain: There is no intracranial hemorrhage, mass or mass effect. Probable remote lacunar infarction of the right caudate and right putamen. Remote right pons infarction. There is mild generalized atrophy. There is moderate chronic microvascular ischemic change. There is no significant extra-axial fluid collection. Vascular: Hyperdense left MCA. Skull: Normal. Negative for fracture or focal lesion. Sinuses/Orbits: No acute finding. IMPRESSION: Suspicious for acute infarction in the left MCA territory, nonhemorrhagic. No mass effect or midline shift. Moderate atrophy and chronic microvascular disease, with remote lacunar infarctions and remote right pons infarction. These results will be called to the ordering clinician or representative by the Radiologist Assistant, and communication documented in the PACS or zVision Dashboard. Electronically Signed   By: Ellery Plunk M.D.   On: 10/21/2016 06:35    Procedures Procedures (including critical care time)  Medications Ordered in ED Medications  nicardipine (CARDENE) 20mg  in 0.86% saline IV infusion (0.1 mg/ml) (2.5 mg/hr Intravenous Rate/Dose Change 10/21/16 0643)  LORazepam (ATIVAN) injection 0.5 mg (0.5 mg Intravenous Not Given 10/21/16 7829)  hydrALAZINE (APRESOLINE) injection 10 mg (not administered)  hydrALAZINE (APRESOLINE) injection 5 mg (5 mg Intravenous Given 10/21/16 0343)  hydrALAZINE (APRESOLINE) injection 5 mg (5 mg Intravenous Given 10/21/16 0416)      Final Clinical Impressions(s) / ED Diagnoses  MCA stroke: Will admit to step down following weaning off cardene.    I personally performed the services described in this documentation, which was scribed in my presence. The recorded information has been reviewed and is accurate.       Cy Blamer, MD 10/21/16 [phone]

## 2016-10-21 NOTE — Progress Notes (Signed)
VASCULAR LAB PRELIMINARY  PRELIMINARY  PRELIMINARY  PRELIMINARY  Carotid duplex completed.    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  Vertebral artery flow is antegrade.   Krishan Mcbreen, RVT 10/21/2016, 6:45 PM

## 2016-10-21 NOTE — Progress Notes (Signed)
RT set up cpap and placed on patient. Patient is resting comfortably with no respiratory distress noted at this time.

## 2016-10-21 NOTE — Evaluation (Signed)
Clinical/Bedside Swallow Evaluation Patient Details  Name: Jamie Aguirre MRN: [id number] Date of Birth: [birth year]  Today's Date: 10/21/2016 Time: SLP Start Time (ACUTE ONLY): 1249 SLP Stop Time (ACUTE ONLY): 1310 SLP Time Calculation (min) (ACUTE ONLY): 21 min  Past Medical History:  Past Medical History:  Diagnosis Date  . Allergic rhinitis, seasonal    assocaited with vertigo sensation  . Arthritis   . Asthma   . Chronic bronchitis   . Chronic kidney disease   . Diverticulitis   . Headaches, cluster   . Hives    idiopathic  . Hyperlipidemia   . Hypertension   . Pellucid marginal degeneration of cornea   . 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 Type 2 diabetes mellitus (HCC)    Past Surgical History:  Past Surgical History:  Procedure Laterality Date  . CATARACT EXTRACTION W/ INTRAOCULAR LENS IMPLANT Left 02/04/13   Dr Valere Dross   HPI:  Jamie Aguirre a 71 y.o.femalewith a Past Medical History of significant for 2 prior strokes approximately 15 years ago, chronic kidney disease, hypertension, hyperlipidemia who presents with sudden onset left-sided weakness since yesterday afternoon around 3 PM. Also has had intermittent stroke like symptoms intermittently for 2-3 weeks associated with slurred speech and facial droop. 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 Head CT 10/21/16 Suspicious for acute infarction in the left MCA territory, nonhemorrhagic. No mass effect or midline shift. Moderate atrophy and chronic microvascular disease, with remote lacunar infarctions and remote right pons infarction. No prior swallowing evaluations in chart. SLP consult ordered for swallowing and cognitive linguistic evaluation.    Assessment / Plan / Recommendation Clinical Impression  Patient presents with moderate risk for aspiration given previous CVA, left sided cranial nerve deficits, and signs of aspiration with thin liquids at bedside.  Patient with mild dysarthria, left-sided facial droop and mildly reduced left-sided jaw strength. Patient with delayed, forceful cough, increased work of breathing and elevated respiration rate (33) following single sips of thin liquid x2, suggestive of decreased airway protection. These signs not observed with nectar thick liquids, pureed or regular solids. Patient required significant time for mastication of regular solid, complained of fatigue from chewing, stated, "I don't know if I can eat this." Recommend initiating dys 2 diet with nectar thick liquids, medications whole in puree. SLP will f/u for tolerance and for cognitive-linguistic assessment.  SLP Visit Diagnosis: Dysphagia, unspecified (R13.10)    Aspiration Risk  Moderate aspiration risk    Diet Recommendation Dysphagia 2 (Fine chop);Nectar-thick liquid   Liquid Administration via: Cup Medication Administration: Whole meds with puree Supervision: Intermittent supervision to cue for compensatory strategies Compensations: Slow rate;Small sips/bites;Lingual sweep for clearance of pocketing Postural Changes: Seated upright at 90 degrees    Other  Recommendations Oral Care Recommendations: Oral care BID Other Recommendations: Order thickener from pharmacy;Prohibited food (jello, ice cream, thin soups);Remove water pitcher   Follow up Recommendations Other (comment) (TBD)      Frequency and Duration min 2x/week  2 weeks       Prognosis Prognosis for Safe Diet Advancement: Good      Swallow Study   General Date of Onset: 10/21/16 HPI: Jamie Aguirre a 71 y.o.femalewith a Past Medical History of significant for 2 prior strokes approximately 15 years ago, chronic kidney disease, hypertension, hyperlipidemia who presents with sudden onset left-sided weakness since yesterday afternoon around 3 PM. Also has had intermittent stroke like symptoms intermittently for 2-3  weeks associated with slurred speech and facial droop. An  embolic left PCA stroke in December of 2007 [place] 10/21/16 Suspicious for acute infarction in the left MCA territory, nonhemorrhagic. No mass effect or midline shift. Moderate atrophy and chronic microvascular disease, with remote lacunar infarctions and remote right pons infarction. No prior swallowing evaluations in chart. SLP consult ordered for swallowing and cognitive linguistic evaluation.  Type of Study: Bedside Swallow Evaluation Previous Swallow Assessment: none in chart Diet Prior to this Study: NPO Temperature Spikes Noted: No Respiratory Status: Room air History of Recent Intubation: No Behavior/Cognition: Alert;Cooperative;Other (Comment) (anxious) Oral Cavity Assessment: Dried secretions;Dry Oral Care Completed by SLP: Yes Oral Cavity - Dentition: Adequate natural dentition;Dentures, bottom Vision: Functional for self-feeding Self-Feeding Abilities: Able to feed self Patient Positioning: Upright in bed Baseline Vocal Quality: Hoarse Volitional Cough: Strong Volitional Swallow: Able to elicit    Oral/Motor/Sensory Function Overall Oral Motor/Sensory Function: Moderate impairment Facial ROM: Reduced left;Suspected CN VII (facial) dysfunction Facial Symmetry: Suspected CN VII (facial) dysfunction;Abnormal symmetry left Facial Strength: Reduced left;Suspected CN VII (facial) dysfunction Facial Sensation: Within Functional Limits Lingual ROM: Within Functional Limits Lingual Symmetry: Within Functional Limits Lingual Strength: Within Functional Limits Lingual Sensation: Within Functional Limits Velum: Within Functional Limits Mandible: Impaired;Suspected CN V (Trigeminal) dysfunction   Ice Chips Ice chips: Within functional limits   Thin Liquid Thin Liquid: Impaired Presentation: Cup Pharyngeal  Phase Impairments: Suspected delayed Swallow;Multiple swallows;Cough - Delayed;Change in Vital Signs    Nectar Thick Nectar Thick Liquid: Within  functional limits Presentation: Cup   Honey Thick Honey Thick Liquid: Not tested   Puree Puree: Within functional limits Presentation: Self Fed;Spoon   Solid   GO   Solid: Impaired Presentation: Self Fed Oral Phase Impairments: Impaired mastication Oral Phase Functional Implications: Impaired mastication    Functional Assessment Tool Used: skilled clinical judgment Functional Limitations: Swallowing Swallow Current Status ([id number]([id number]): At least 40 percent but less than 60 percent impaired, limited or restricted Swallow Goal Status [phone](G8997): At least 20 percent but less than 40 percent impaired, limited or restricted   Arlana LindauMary E Gussie Towson 10/21/2016,1:23 PM    Rondel BatonMary Beth Crista Nuon, MS CF-SLP Speech-Language Pathologist [phone]

## 2016-10-21 NOTE — Consult Note (Signed)
NEURO HOSPITALIST CONSULT NOTE   Requestig physician: Dr. Nicanor Alcon  Reason for Consult: Possible stroke  History obtained from:  Patient   HPI:                                                                                                                                          Jamie Aguirre is an 71 y.o. female who presented with left arm and leg weakness, which patient initially stated began on Friday at 1500. However, she endorsed varying number of days of stuttering symptoms to the ED physician, making history unreliable. SBP was > 200 with EMS and upon arrival to ED.   She endorses history of 2 prior strokes 15 years ago occurring 9 months apart, the first with right sided weakness that later resolved. She is vague and nonspecific regarding the symptoms that she had with her second stroke. 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  She initially refused CT when this was recommended to her by the ED attending.   Her PMHx also includes chronic bronchitis, hearing loss, CKD, cluster headaches, DM2, HLD and HTN.   Home medications include ASA, Plavix and Lipitor.    Past Medical History:  Diagnosis Date  . Allergic rhinitis, seasonal    assocaited with vertigo sensation  . Arthritis   . Asthma   . Chronic bronchitis   . Chronic kidney disease   . Diverticulitis   . Headaches, cluster   . Hives    idiopathic  . Hyperlipidemia   . Hypertension   . Pellucid marginal degeneration of cornea   . 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 Type 2 diabetes mellitus (HCC)     Past Surgical History:  Procedure Laterality Date  . CATARACT EXTRACTION W/ INTRAOCULAR LENS IMPLANT Left 02/04/13   Dr Valere Dross    Family History  Problem Relation Age of Onset  . Heart disease Father   . Arthritis Other   . Stroke Other   . Hypertension Other   . Hyperlipidemia Other   . Diabetes Other     Social History:  reports  that she quit smoking about 41 years ago. Her smoking use included Cigarettes. She has a 70.00 pack-year smoking history. She has never used smokeless tobacco. She reports that she does not drink alcohol or use drugs.  Allergies  Allergen Reactions  . Penicillins Anaphylaxis  . Ciprofloxacin Other (See Comments)    Vertigo SE  . Codeine     Codeine family  . Latex Dermatitis  . Sulfa Antibiotics     HOME MEDICATIONS:  albuterol (VENTOLIN HFA) 108 (90 BASE) MCG/ACT inhaler Inhale 2 puffs into the lungs every 6 (six) hours as needed. Myrlene Broker, MD Needs Review  aspirin 81 MG tablet Take 81 mg by mouth daily.  Historical Provider, MD Needs Review  atorvastatin (LIPITOR) 20 MG tablet TAKE 1 TABLET BY MOUTH EVERY DAY Myrlene Broker, MD Needs Review  baclofen (LIORESAL) 10 MG tablet Take 1 tablet (10 mg total) by mouth 3 (three) times daily as needed for muscle spasms. Myrlene Broker, MD Needs Review  benzonatate (TESSALON) 200 MG capsule Take 1 capsule (200 mg total) by mouth 3 (three) times daily as needed for cough. Myrlene Broker, MD Needs Review  clopidogrel (PLAVIX) 75 MG tablet TAKE 1 TABLET BY MOUTH ONCE DAILY Myrlene Broker, MD Needs Review  cyclobenzaprine (FLEXERIL) 5 MG tablet Take 1 tablet (5 mg total) by mouth every 8 (eight) hours as needed for muscle spasms. Myrlene Broker, MD Needs Review  diazepam (VALIUM) 5 MG tablet Take 0.5-1 tablets (2.5-5 mg total) by mouth every 12 (twelve) hours as needed (vertigo symptoms). Newt Lukes, MD Needs Review  diphenhydrAMINE (BENADRYL) 25 MG tablet Take 25 mg by mouth at bedtime. Historical Provider, MD Needs Review  gabapentin (NEURONTIN) 100 MG capsule One pill every eight hours as needed Pecola Lawless, MD Needs Review  JENTADUETO 2.12-998 MG TABS TAKE 1 TABLET BY MOUTH TWICE  DAILY Myrlene Broker, MD Needs Review  lipase/protease/amylase (CREON) 36000 UNITS CPEP capsule Take 1 capsule (36,000 Units total) by mouth 3 (three) times daily before meals. Myrlene Broker, MD Needs Review  loratadine (CLARITIN) 10 MG tablet Take 10 mg by mouth every morning. Historical Provider, MD Needs Review  losartan (COZAAR) 100 MG tablet TAKE 1 TABLET BY MOUTH EVERY DAY Myrlene Broker, MD Needs Review  metoprolol succinate (TOPROL-XL) 100 MG 24 hr tablet TAKE 1 TABLET BY MOUTH TWICE A DAY Myrlene Broker, MD Needs Review  silver sulfADIAZINE (SILVADENE) 1 % cream Apply 1 application topically as needed.  Historical Provider, MD Needs Review  triamcinolone cream (KENALOG) 0.1 % Apply 1 application topically 2 (two) times daily as needed. Myrlene Broker, MD Needs Review    ROS:                                                                                                                                       History obtained from patient. Positive for chronic bilateral ankle pain secondary to arthritis and left sided sensory numbness.   Blood pressure (!) 240/100, pulse 75, temperature 98 F (36.7 C), temperature source Oral, resp. rate (!) 27, SpO2 99 %.  General Examination:  HEENT-  Normocephalic/atraumatic. Decreased oral mucosal hydration. Lungs- Respirations unlabored Extremities- Warm and well perfused  Neurological Examination Mental Status: Alert, oriented, thought content appropriate.  In the context of mild dysarthria, speech is fluent. Naming, repetition and comprehension intact.   Cranial Nerves: II: Visual fields intact, PERRL, fixates normally III,IV, VI: ptosis not present, EOMI without nystagmus V,VII: Mild left facial droop. Decreased temperature sensation to left face.  VIII: HOH IX,X: Palate rises symmetrically. Voice is hoarse.  XI:  Delayed shoulder shrug on left XII: midline tongue extension Motor: RUE 5/5 RLE 5/5 LUE 0/5 LLE 2/5 hip flexion, 4-/5 knee extension, 4-/5 knee flexion, wiggles toes normally (deferred testing of ADF/APF bilaterally due to ankle pain) Sensory: Temperature and fine touch intact on the right. Decreased temp sensation LUE. Normal temp sensation LLE. No extinction.  Deep Tendon Reflexes: 1+ biceps, brachioradialis and patellae bilaterally. Deferred at ankles due to ankle pain. Toes upgoing bilaterally.  Cerebellar: No ataxia with FNF on right. Unable to perform on left.  Gait: Deferred due to weakness and falls risk concerns  Lab Results: Basic Metabolic Panel:  Recent Labs Lab 10/21/16 0259  NA 137  K 4.7  CL 100*  GLUCOSE 259*  BUN 27*  CREATININE 1.30*    Liver Function Tests: No results for input(s): AST, ALT, ALKPHOS, BILITOT, PROT, ALBUMIN in the last 168 hours. No results for input(s): LIPASE, AMYLASE in the last 168 hours. No results for input(s): AMMONIA in the last 168 hours.  CBC:  Recent Labs Lab 10/21/16 0254 10/21/16 0259  WBC 11.9*  --   NEUTROABS 9.1*  --   HGB 12.8 13.3  HCT 39.1 39.0  MCV 87.5  --   PLT 281  --     Cardiac Enzymes: No results for input(s): CKTOTAL, CKMB, CKMBINDEX, TROPONINI in the last 168 hours.  Lipid Panel: No results for input(s): CHOL, TRIG, HDL, CHOLHDL, VLDL, LDLCALC in the last 168 hours.  CBG: No results for input(s): GLUCAP in the last 168 hours.  Microbiology: Results for orders placed or performed in visit on 01/07/15  CULTURE, URINE COMPREHENSIVE     Status: None   Collection Time: 01/07/15  9:32 AM  Result Value Ref Range Status   Culture KLEBSIELLA SPECIES  Final   Colony Count >=100,000 COLONIES/ML  Final   Organism ID, Bacteria KLEBSIELLA SPECIES  Final      Susceptibility   Klebsiella species -  (no method available)    AMPICILLIN >=32 Resistant     AMOX/CLAVULANIC <=2 Sensitive      AMPICILLIN/SULBACTAM 8 Sensitive     PIP/TAZO <=4 Sensitive     IMIPENEM 1 Sensitive     CEFAZOLIN <=4 Sensitive     CEFTRIAXONE <=1 Sensitive     CEFTAZIDIME <=1 Sensitive     CEFEPIME <=1 Sensitive     GENTAMICIN <=1 Sensitive     TOBRAMYCIN <=1 Sensitive     CIPROFLOXACIN <=0.25 Sensitive     NITROFURANTOIN <=16 Sensitive     TRIMETH/SULFA <=20 Sensitive     Coagulation Studies: No results for input(s): LABPROT, INR in the last 72 hours.  Imaging: No results found.  Assessment: 1. Acute onset of left hemiparesis. DDx includes acute or subacute ischemic stroke in right MCA territory versus ICH. The patient gives different accounts of time of symptom onset, some corresponding to a TOSO greater than 24 hours prior to the assessment.  2. Severe HTN. May be physiological response to new ischemic stroke or hemorrhage.  3.  Refused CT head initially. Later consented to this if administered with 2 mg IV Ativan or other sedating medication prior to scanning.  4. Refuses MRI brain.  5. Elevated BUN/Cr ratio, decreased oral mucosal hydration and hoarse voice, suggestive of volume depletion  Recommendations: 1. Await results of CT head.  2. BP management with SBP < 140 in case this is a hemorrhage.  3. Gentle IV hydration.  Electronically signed: Dr. Caryl Pina 10/21/2016, 3:30 AM

## 2016-10-21 NOTE — ED Notes (Signed)
Sent labels to main lab

## 2016-10-21 NOTE — ED Triage Notes (Signed)
Patient here by EMS for weakness and stroke like symptoms since 10/20/16 at 1500.  Left sided weakness and facial droop noted.  BP over 200 systolic with EMS and on arrival.  Patient is hard of hearing but is A&Ox4 at this time.

## 2016-10-21 NOTE — ED Notes (Signed)
Tried to talk to pt about getting CT scan. Pt still refused

## 2016-10-21 NOTE — ED Notes (Signed)
Pt taken to CT, but denied to get the scan when she got on the table. Pt stated she gets vertigo when she is laid back.

## 2016-10-21 NOTE — ED Notes (Signed)
MD stated to try to titrate pt off drip and switch pt to hydralazine

## 2016-10-22 ENCOUNTER — Observation Stay (HOSPITAL_COMMUNITY): Payer: Medicare Other

## 2016-10-22 DIAGNOSIS — I161 Hypertensive emergency: Secondary | ICD-10-CM | POA: Diagnosis present

## 2016-10-22 DIAGNOSIS — E1122 Type 2 diabetes mellitus with diabetic chronic kidney disease: Secondary | ICD-10-CM | POA: Diagnosis present

## 2016-10-22 DIAGNOSIS — Z6841 Body Mass Index (BMI) 40.0 and over, adult: Secondary | ICD-10-CM | POA: Diagnosis not present

## 2016-10-22 DIAGNOSIS — Z833 Family history of diabetes mellitus: Secondary | ICD-10-CM | POA: Diagnosis not present

## 2016-10-22 DIAGNOSIS — Z8249 Family history of ischemic heart disease and other diseases of the circulatory system: Secondary | ICD-10-CM | POA: Diagnosis not present

## 2016-10-22 DIAGNOSIS — G4733 Obstructive sleep apnea (adult) (pediatric): Secondary | ICD-10-CM | POA: Diagnosis present

## 2016-10-22 DIAGNOSIS — N179 Acute kidney failure, unspecified: Secondary | ICD-10-CM | POA: Diagnosis present

## 2016-10-22 DIAGNOSIS — R350 Frequency of micturition: Secondary | ICD-10-CM | POA: Diagnosis present

## 2016-10-22 DIAGNOSIS — I63311 Cerebral infarction due to thrombosis of right middle cerebral artery: Secondary | ICD-10-CM | POA: Diagnosis not present

## 2016-10-22 DIAGNOSIS — M25552 Pain in left hip: Secondary | ICD-10-CM | POA: Diagnosis present

## 2016-10-22 DIAGNOSIS — Z823 Family history of stroke: Secondary | ICD-10-CM | POA: Diagnosis not present

## 2016-10-22 DIAGNOSIS — I63512 Cerebral infarction due to unspecified occlusion or stenosis of left middle cerebral artery: Secondary | ICD-10-CM

## 2016-10-22 DIAGNOSIS — G8194 Hemiplegia, unspecified affecting left nondominant side: Secondary | ICD-10-CM | POA: Diagnosis present

## 2016-10-22 DIAGNOSIS — R29810 Facial weakness: Secondary | ICD-10-CM | POA: Diagnosis present

## 2016-10-22 DIAGNOSIS — J45909 Unspecified asthma, uncomplicated: Secondary | ICD-10-CM | POA: Diagnosis present

## 2016-10-22 DIAGNOSIS — I63513 Cerebral infarction due to unspecified occlusion or stenosis of bilateral middle cerebral arteries: Secondary | ICD-10-CM | POA: Diagnosis present

## 2016-10-22 DIAGNOSIS — E785 Hyperlipidemia, unspecified: Secondary | ICD-10-CM | POA: Diagnosis present

## 2016-10-22 DIAGNOSIS — Z79899 Other long term (current) drug therapy: Secondary | ICD-10-CM | POA: Diagnosis not present

## 2016-10-22 DIAGNOSIS — Z7902 Long term (current) use of antithrombotics/antiplatelets: Secondary | ICD-10-CM | POA: Diagnosis not present

## 2016-10-22 DIAGNOSIS — D72829 Elevated white blood cell count, unspecified: Secondary | ICD-10-CM | POA: Diagnosis present

## 2016-10-22 DIAGNOSIS — Z7984 Long term (current) use of oral hypoglycemic drugs: Secondary | ICD-10-CM | POA: Diagnosis not present

## 2016-10-22 DIAGNOSIS — N183 Chronic kidney disease, stage 3 (moderate): Secondary | ICD-10-CM | POA: Diagnosis present

## 2016-10-22 DIAGNOSIS — Z7982 Long term (current) use of aspirin: Secondary | ICD-10-CM | POA: Diagnosis not present

## 2016-10-22 DIAGNOSIS — Z87891 Personal history of nicotine dependence: Secondary | ICD-10-CM | POA: Diagnosis not present

## 2016-10-22 DIAGNOSIS — F40240 Claustrophobia: Secondary | ICD-10-CM | POA: Diagnosis present

## 2016-10-22 DIAGNOSIS — I129 Hypertensive chronic kidney disease with stage 1 through stage 4 chronic kidney disease, or unspecified chronic kidney disease: Secondary | ICD-10-CM | POA: Diagnosis present

## 2016-10-22 LAB — LIPID PANEL
CHOL/HDL RATIO: 3.6 ratio
Cholesterol: 186 mg/dL (ref 0–200)
HDL: 51 mg/dL (ref >40)
LDL CALC: 87 mg/dL (ref 0–99)
TRIGLYCERIDES: 241 mg/dL — AB (ref <150)
VLDL: 48 mg/dL — AB (ref 0–40)

## 2016-10-22 LAB — ECHOCARDIOGRAM COMPLETE
HEIGHTINCHES: 62 in
LDCA: 2.01 cm2
LVOT diameter: 16 mm
WEIGHTICAEL: 3753.11 [oz_av]

## 2016-10-22 LAB — HEMOGLOBIN A1C
Hgb A1c MFr Bld: 9.3 % — ABNORMAL HIGH (ref 4.8–5.6)
Mean Plasma Glucose: 220 mg/dL

## 2016-10-22 LAB — BASIC METABOLIC PANEL
Anion gap: 15 (ref 5–15)
BUN: 23 mg/dL — ABNORMAL HIGH (ref 6–20)
CALCIUM: 9.4 mg/dL (ref 8.9–10.3)
CO2: 22 mmol/L (ref 22–32)
CREATININE: 1.37 mg/dL — AB (ref 0.44–1.00)
Chloride: 98 mmol/L — ABNORMAL LOW (ref 101–111)
GFR, EST AFRICAN AMERICAN: 44 mL/min — AB (ref >60)
GFR, EST NON AFRICAN AMERICAN: 38 mL/min — AB (ref >60)
Glucose, Bld: 274 mg/dL — ABNORMAL HIGH (ref 65–99)
Potassium: 3.9 mmol/L (ref 3.5–5.1)
SODIUM: 135 mmol/L (ref 135–145)

## 2016-10-22 LAB — CBC
HEMATOCRIT: 39 % (ref 36.0–46.0)
Hemoglobin: 12.9 g/dL (ref 12.0–15.0)
MCH: 28.6 pg (ref 26.0–34.0)
MCHC: 33.1 g/dL (ref 30.0–36.0)
MCV: 86.5 fL (ref 78.0–100.0)
PLATELETS: 324 10*3/uL (ref 150–400)
RBC: 4.51 MIL/uL (ref 3.87–5.11)
RDW: 15.2 % (ref 11.5–15.5)
WBC: 11.8 10*3/uL — AB (ref 4.0–10.5)

## 2016-10-22 LAB — GLUCOSE, CAPILLARY
GLUCOSE-CAPILLARY: 230 mg/dL — AB (ref 65–99)
GLUCOSE-CAPILLARY: 321 mg/dL — AB (ref 65–99)
Glucose-Capillary: 307 mg/dL — ABNORMAL HIGH (ref 65–99)
Glucose-Capillary: 330 mg/dL — ABNORMAL HIGH (ref 65–99)

## 2016-10-22 MED ORDER — HYDRALAZINE HCL 20 MG/ML IJ SOLN
5.0000 mg | Freq: Once | INTRAMUSCULAR | Status: AC
  Administered 2016-10-22: 5 mg via INTRAVENOUS
  Filled 2016-10-22: qty 1

## 2016-10-22 MED ORDER — ALPRAZOLAM 0.25 MG PO TABS
0.2500 mg | ORAL_TABLET | Freq: Once | ORAL | Status: AC
  Administered 2016-10-22: 0.25 mg via ORAL
  Filled 2016-10-22: qty 1

## 2016-10-22 MED ORDER — ASPIRIN EC 325 MG PO TBEC
325.0000 mg | DELAYED_RELEASE_TABLET | Freq: Every day | ORAL | Status: DC
  Administered 2016-10-22 – 2016-10-27 (×6): 325 mg via ORAL
  Filled 2016-10-22 (×6): qty 1

## 2016-10-22 MED ORDER — LOSARTAN POTASSIUM 50 MG PO TABS
100.0000 mg | ORAL_TABLET | Freq: Every day | ORAL | Status: DC
  Administered 2016-10-22 – 2016-10-27 (×6): 100 mg via ORAL
  Filled 2016-10-22 (×6): qty 2

## 2016-10-22 MED ORDER — CLOPIDOGREL BISULFATE 75 MG PO TABS
75.0000 mg | ORAL_TABLET | Freq: Every day | ORAL | Status: DC
  Administered 2016-10-22 – 2016-10-27 (×6): 75 mg via ORAL
  Filled 2016-10-22 (×6): qty 1

## 2016-10-22 MED ORDER — LORAZEPAM 2 MG/ML IJ SOLN: 1.0000 mg | Freq: Once | INTRAMUSCULAR | Status: DC

## 2016-10-22 MED ORDER — ATORVASTATIN CALCIUM 40 MG PO TABS
40.0000 mg | ORAL_TABLET | Freq: Every day | ORAL | Status: DC
  Administered 2016-10-22 – 2016-10-26 (×5): 40 mg via ORAL
  Filled 2016-10-22 (×5): qty 1

## 2016-10-22 MED ORDER — ASPIRIN 300 MG RE SUPP: 300.0000 mg | Freq: Every day | RECTAL | Status: DC

## 2016-10-22 NOTE — Progress Notes (Signed)
Occupational Therapy Evaluation Patient Details Name: Jamie Aguirre MRN: [id number] DOB: [birth year] Today's Date: 10/22/2016    History of Present Illness Jamie Aguirre is a 71 y.o. female with a Past Medical History of significant for 2 prior strokes approximately 15 years ago, chronic kidney disease, hypertension, hyperlipidemia who presents with sudden onset left-sided weakness since yesterday afternoon around 3 PM.  Refusing MRI;   Per neurology, suspect MCA CVA. has a pertinent past medical history of Arthritis;Chronic bronchitis; Chronic kidney disease;  Hyperlipidemia; Hypertension; Pellucid marginal degeneration of cornea; Stroke (HCC) (09/2005, 07/2006); and Type 2 diabetes mellitus (HCC).   Clinical Impression   PTA, pt lived alone and was independent with mobility and ADL. Pt drove and did her own shopping. Used a RW if fatigued. This information was confirmed with Healthcare POA who was present at end of session. Pt presents with significant functional change in status and now requires Max to total A with ADL and mobility due to below deficits. Pt developing flexor tone LU/LE and moving LUE in flexor synergy pattern on command. Will discuss recommendation of L resting hand splint with MD. Will monitor L Foot for splint. Pt will benefit from extensive rehab at SNF to maximize functional level of independence. Will follow acutely to address established goals and facilitate DC to next venue of care.     Follow Up Recommendations  SNF;Supervision/Assistance - 24 hour    Equipment Recommendations  3 in 1 bedside commode    Recommendations for Other Services       Precautions / Restrictions Precautions Precautions: Fall Restrictions Weight Bearing Restrictions: No      Mobility Bed Mobility Overal bed mobility: Needs Assistance Bed Mobility: Rolling Rolling:  (pt rolled onto L side using bed rail. Roll R with Max A)   Supine to sit: +2 for physical assistance;Mod assist      General bed mobility comments: Used bed pad to assist feet off of bed and heavy mod assist to elevate trunk to sit; unstable initially in sitting requiring light mod assist for sitting balance, this improved with time  Transfers Overall transfer level: Needs assistance  +2 physical assistance needed - Per PT - Max assist       General transfer comment: will need +2 Assist    Balance Overall balance assessment: Needs assistance Sitting-balance support: Feet supported;Single extremity supported Sitting balance-Leahy Scale: Poor Sitting balance - Comments: R lateral lean when supported in bed. poor sense of midline   Standing balance support: Bilateral upper extremity supported Standing balance-Leahy Scale: Zero                              ADL Overall ADL's : Needs assistance/impaired Eating/Feeding: Minimal assistance (nectar thick)   Grooming: Moderate assistance Grooming Details (indicate cue type and reason): pt ableto wash face. Difficulty locating L UE to wash hand Upper Body Bathing: Moderate assistance   Lower Body Bathing: Maximal assistance;Bed level   Upper Body Dressing : Maximal assistance;Sitting   Lower Body Dressing: Total assistance               Functional mobility during ADLs: +2 for physical assistance  Significant change in occupational profile as pt lived independently. Per "caregiver", pt has "significant past and is a little unusual because of it"         Vision Baseline Vision/History: Wears glasses Wears Glasses: At all times Vision Assessment?: Vision impaired- to be further tested in functional  context     Perception Perception Spatial deficits: apparent deficits, will further assess Comments: unable to locate LUE when it was under the covers. Once covers pulled back, pt continued to be unable to locate L arm without vc   Praxis Praxis Praxis tested?:  (will further assess)    Pertinent Vitals/Pain Pain Assessment:  Faces Faces Pain Scale: Hurts whole lot Pain Location: LLE - hip knee ankle Pain Descriptors / Indicators: Crying;Discomfort;Grimacing;Guarding Pain Intervention(s): Limited activity within patient's tolerance;Repositioned     Hand Dominance Right   Extremity/Trunk Assessment Upper Extremity Assessment Upper Extremity Assessment: LUE deficits/detail LUE Deficits / Details: Brunstrom stage II arm(synergy developing); stage I hand - presynergy. flexor tone developing LUE. Pt with active shoulder Adduction and elbow flexion. Hand mildily edematous due to dependent edema and immobility. Sensation impaired. Pt trying to pick up pillow case instead of arm when asked to pick up arm. minimal active shoulder elevation. Non-functional at this time.  LUE Sensation: decreased light touch;decreased proprioception LUE Coordination: decreased fine motor;decreased gross motor   Lower Extremity Assessment Lower Extremity Assessment: Defer to PT evaluation LLE Deficits / Details: abnormal tone. upgoing toe L great toe. Difficulty achieving full dorsiflexion. Sensitive and very painful LLE - ankle, knee and hip LLE: Unable to fully assess due to pain LLE Sensation: decreased light touch LLE Coordination: decreased fine motor;decreased gross motor       Communication Communication Communication: Expressive difficulties;Other (comment)   Cognition Arousal/Alertness: Awake/alert Behavior During Therapy: Restless;Impulsive;Anxious Overall Cognitive Status: Impaired/Different from baseline Area of Impairment: Attention;Memory;Safety/judgement;Awareness;Problem solving   Current Attention Level: Sustained Memory: Decreased short-term memory   Safety/Judgement: Decreased awareness of safety;Decreased awareness of deficits Awareness: Emergent Problem Solving: Slow processing;Difficulty sequencing General Comments: Pt thinks she will be able to go home in a few days   General Comments       Exercises  Exercises: Other exercises Other Exercises Other Exercises: encouraged visitors to increase attention to L side  Other Exercises: Began education regarding LUE positionoing - elevated on pillows and within pt's sight Other Exercises: L elbow PROM   Shoulder Instructions      Home Living Family/patient expects to be discharged to:: Skilled nursing facility Living Arrangements: Alone Available Help at Discharge: Family;Available PRN/intermittently                             Additional Comments: Plan for SNF for rehab      Prior Functioning/Environment Level of Independence: Independent with assistive device(s)        Comments: Pt was independent with mobility, but used a RW when fatigued. Pt was driving adn doing her own shopping. Had a Ecologist"trainer" who assisted with home managemetn as needed. He is now her healthcare POA        OT Problem List: Decreased strength;Decreased range of motion;Decreased activity tolerance;Impaired vision/perception;Impaired balance (sitting and/or standing);Decreased coordination;Decreased cognition;Decreased safety awareness;Decreased knowledge of use of DME or AE;Decreased knowledge of precautions;Cardiopulmonary status limiting activity;Impaired sensation;Impaired tone;Obesity;Impaired UE functional use;Pain;Increased edema      OT Treatment/Interventions: Self-care/ADL training;Therapeutic exercise;Neuromuscular education;DME and/or AE instruction;Splinting;Therapeutic activities;Cognitive remediation/compensation;Visual/perceptual remediation/compensation;Patient/family education;Balance training    OT Goals(Current goals can be found in the care plan section) Acute Rehab OT Goals Patient Stated Goal: to go back home OT Goal Formulation: With patient Time For Goal Achievement: 11/05/16 Potential to Achieve Goals: Good ADL Goals Pt Will Perform Eating: with set-up;with supervision;sitting Pt Will Perform Grooming: with set-up;with  supervision;sitting Pt  Will Perform Upper Body Bathing: with min assist;sitting Additional ADL Goal #1: Pt will maintain midline postural control during ADL task while EOB x 10 min  with min A during ADL task Additional ADL Goal #2: Pt will incorporate LUE in bed mobility for ADL task with moderate facilitory cues  OT Frequency: Min 2X/week   Barriers to D/C: Decreased caregiver support          Co-evaluation              End of Session Nurse Communication: Mobility status  Activity Tolerance: Patient tolerated treatment well Patient left: in bed;with call bell/phone within reach;with bed alarm set;with family/visitor present  OT Visit Diagnosis: Other abnormalities of gait and mobility (R26.89);Muscle weakness (generalized) (M62.81);Low vision, both eyes (H54.2);Hemiplegia and hemiparesis Hemiplegia - Right/Left: Left Hemiplegia - dominant/non-dominant: Non-Dominant Hemiplegia - caused by: Unspecified                ADL either performed or assessed with clinical judgement  Time: 4098-1191 OT Time Calculation (min): 34 min Charges:  OT General Charges $OT Visit: 1 Procedure OT Evaluation $OT Eval High Complexity: 1 Procedure OT Treatments $Self Care/Home Management : 8-22 mins G-Codes: OT G-codes **NOT FOR INPATIENT CLASS** Functional Assessment Tool Used: Clinical judgement Functional Limitation: Self care Self Care Current Status (Y7829): At least 80 percent but less than 100 percent impaired, limited or restricted Self Care Goal Status (F6213): At least 20 percent but less than 40 percent impaired, limited or restricted   [facility], OT/L  [phone] 10/22/2016  Yarelli Decelles,HILLARY 10/22/2016, 5:01 PM

## 2016-10-22 NOTE — Progress Notes (Signed)
Patient hysterically crying, difficult to redirect.  Patient adamantly refusing MRI.  RN tried educating patient on importance of MRI and need for the test.  Neuro MD notified. [place], Mitzi HansenJessica Marie

## 2016-10-22 NOTE — Progress Notes (Addendum)
PROGRESS NOTE    Jamie KaufmannLinda Rodier  UEA:[id number] DOB: August 17, 1945 DOA: 10/21/2016 PCP: Myrlene BrokerElizabeth A Crawford, MD   Brief Narrative:  71 year old female with past medical history of 2 CVAs, chronic kidney disease, hypertension, lipidemia came with complaints of sudden onset of left-sided weakness starting the prior to her admission. She stated she had been having intermittent stroke type of symptoms for past 2 or 3 weeks. She is on Plavix at home. At first she was refusing CT of the head but later showed suspicion for  infarction in left MCA region with some old lacunar infarcts. She has been refusing to get MRI.   Assessment & Plan:   Principal Problem:   Acute ischemic left MCA stroke (HCC) Active Problems:   Type 2 diabetes mellitus (HCC)   HTN (hypertension)   Hyperlipidemia   OSA (obstructive sleep apnea)   Hypertensive emergency   Stroke [facility](HCC)   Urinary frequency   Left hip pain  Subacute-chronic ischemic stroke in left MCA territory -CT of the head was done -Patient needs MRI of the brain which she is refusing at this time. Neurology is following the patient and I spoke with Dr.Xu this morning and will attempt to get transcranial Dopplers tomorrow. -Meanwhile she can work with physical therapy and will likely end up needing rehabilitation -Supportive care  Hypertensive emergency -She's off the Cardene drip at this time and home losartan 100 mg ordered. For blood pressure does not improve with that she may have to go back on Cardene drip. Home medications can be restarted -Monitor closely  Hyperlipidemia -Lipid panel -Continue statin and may need dose adjustment  Diabetes type 2 -Check hemoglobin A1c -Accu-Cheks, insulin sliding scale -diabetic coordinator consult.   Chronic kidney disease stage III -Her creatinine appears to be at baseline of 1.3. In the past she has fluctuated from 1.1-1.4. -Avoid nephrotoxic drugs, monitor urine output. Monitor  creatinine  Obstructive sleep apnea-CPAP as needed  Increasing urinary frequency-UA negative for urinary tract infection which showed glucosuria.  DVT prophylaxis: Heparin Code Status: Full code Family Communication:  Caregiver was also POA present at bedside Disposition Plan: Her blood pressure is elevated this afternoon. If improved will transition her to MedSurg floor.   Consultants:   Neuro  Procedures:   None  Antimicrobials:   None   Subjective: Patient is very anxious in general at baseline per POA is present at bedside. She is refusing to get MRI brain done. No other complaints at this time.  Objective: Vitals:   10/22/16 0900 10/22/16 1000 10/22/16 1100 10/22/16 1234  BP:    (!) 200/91  Pulse: 67 88 93   Resp: 20 (!) 21 (!) 29 18  Temp:    97.6 F (36.4 C)  TempSrc:    Oral  SpO2: 95% 95% 96% 97%  Weight:      Height:        Intake/Output Summary (Last 24 hours) at 10/22/16 1330 Last data filed at 10/22/16 1100  Gross per 24 hour  Intake              175 ml  Output              550 ml  Net             -375 ml   Filed Weights   10/21/16 1030 10/22/16 0315  Weight: 105.5 kg (232 lb 9.4 oz) 106.4 kg (234 lb 9.1 oz)    Examination:  General exam: Appears calm and comfortable  Respiratory system: Clear to auscultation. Respiratory effort normal. Cardiovascular system: S1 & S2 heard, RRR. No JVD, murmurs, rubs, gallops or clicks. No pedal edema. Gastrointestinal system: Abdomen is nondistended, soft and nontender. No organomegaly or masses felt. Normal bowel sounds heard. Central nervous system: Alert and oriented. Left sided facial droop. dryarthria Extremities: 1/5 strength in LUE and LLE.  Skin: No rashes, lesions or ulcers Psychiatry: Judgement and insight appear normal. Mood & affect appropriate.     Data Reviewed:   CBC:  Recent Labs Lab 10/21/16 0254 10/21/16 0259 10/21/16 0854 10/22/16 0333  WBC 11.9*  --  11.4* 11.8*  NEUTROABS  9.1*  --   --   --   HGB 12.8 13.3 12.1 12.9  HCT 39.1 39.0 37.0 39.0  MCV 87.5  --  86.2 86.5  PLT 281  --  274 324   Basic Metabolic Panel:  Recent Labs Lab 10/21/16 0254 10/21/16 0259 10/21/16 0854 10/22/16 0333  NA 135 137  --  135  K 4.6 4.7  --  3.9  CL 97* 100*  --  98*  CO2 25  --   --  22  GLUCOSE 254* 259*  --  274*  BUN 22* 27*  --  23*  CREATININE 1.41* 1.30* 1.33* 1.37*  CALCIUM 9.2  --   --  9.4   GFR: Estimated Creatinine Clearance: 44.4 mL/min (by C-G formula based on SCr of 1.37 mg/dL (H)). Liver Function Tests:  Recent Labs Lab 10/21/16 0254  AST 31  ALT 27  ALKPHOS 49  BILITOT 0.5  PROT 7.9  ALBUMIN 4.4   No results for input(s): LIPASE, AMYLASE in the last 168 hours. No results for input(s): AMMONIA in the last 168 hours. Coagulation Profile:  Recent Labs Lab 10/21/16 0254  INR 1.00   Cardiac Enzymes: No results for input(s): CKTOTAL, CKMB, CKMBINDEX, TROPONINI in the last 168 hours. BNP (last 3 results) No results for input(s): PROBNP in the last 8760 hours. HbA1C:  Recent Labs  10/21/16 0854  HGBA1C 9.3*   CBG:  Recent Labs Lab 10/21/16 0722 10/21/16 1236 10/21/16 1727 10/21/16 2108 10/22/16 0854  GLUCAP 279* 282* 231* 260* 307*   Lipid Profile:  Recent Labs  10/22/16 0333  CHOL 186  HDL 51  LDLCALC 87  TRIG 241*  CHOLHDL 3.6   Thyroid Function Tests: No results for input(s): TSH, T4TOTAL, FREET4, T3FREE, THYROIDAB in the last 72 hours. Anemia Panel: No results for input(s): VITAMINB12, FOLATE, FERRITIN, TIBC, IRON, RETICCTPCT in the last 72 hours. Sepsis Labs: No results for input(s): PROCALCITON, LATICACIDVEN in the last 168 hours.  Recent Results (from the past 240 hour(s))  MRSA PCR Screening     Status: None   Collection Time: 10/21/16 10:30 AM  Result Value Ref Range Status   MRSA by PCR NEGATIVE NEGATIVE Final    Comment:        The GeneXpert MRSA Assay (FDA approved for NASAL specimens only),  is one component of a comprehensive MRSA colonization surveillance program. It is not intended to diagnose MRSA infection nor to guide or monitor treatment for MRSA infections.          Radiology Studies: Ct Head Wo Contrast  Addendum Date: 10/21/2016   ADDENDUM REPORT: 10/21/2016 08:39 ADDENDUM: I was asked by the admitting team to review this case at 8:18 am on 10/21/2016. No changes of acute cortically based infarct are identified. There is widespread patchy cerebral white matter hypodensity. There is a chronic appearing  lacunar infarct in the right paracentral pons. Dr. Clovis Riley was suspicious of hyperdense left MCA. There is extensive Calcified atherosclerosis at the skull base. To me the MCA M1 segments appear symmetric. CONCLUSION: No acute cortically based infarct identified by CT. Changes of advanced chronic small vessel ischemia. This was discussed by telephone with Dr. Dierdre Searles On 10/21/2016 at 0838 hours. Electronically Signed   By: Odessa Fleming M.D.   On: 10/21/2016 08:39   Result Date: 10/21/2016 CLINICAL DATA:  Left upper extremity weakness, onset yesterday afternoon EXAM: CT HEAD WITHOUT CONTRAST TECHNIQUE: Contiguous axial images were obtained from the base of the skull through the vertex without intravenous contrast. COMPARISON:  08/28/2007 FINDINGS: Brain: There is no intracranial hemorrhage, mass or mass effect. Probable remote lacunar infarction of the right caudate and right putamen. Remote right pons infarction. There is mild generalized atrophy. There is moderate chronic microvascular ischemic change. There is no significant extra-axial fluid collection. Vascular: Hyperdense left MCA. Skull: Normal. Negative for fracture or focal lesion. Sinuses/Orbits: No acute finding. IMPRESSION: Suspicious for acute infarction in the left MCA territory, nonhemorrhagic. No mass effect or midline shift. Moderate atrophy and chronic microvascular disease, with remote lacunar infarctions and  remote right pons infarction. These results will be called to the ordering clinician or representative by the Radiologist Assistant, and communication documented in the PACS or zVision Dashboard. Electronically Signed: By: Ellery Plunk M.D. On: 10/21/2016 06:35   Dg Hips Bilat With Pelvis 3-4 Views  Result Date: 10/21/2016 CLINICA DATA: Hip pain after being moved in bed. EXAM: DG HIP (WITH OR WITHOUT PELVIS) 3-4V BILAT COMPARISON:  None. FINDINGS: No fracture or dislocation. Bilateral hip joint spaces are preserved. No evidence avascular necrosis. Moderate colonic stool burden. Several phleboliths overlie the lower pelvis. Regional soft tissues appear otherwise normal. IMPRESSION: Unremarkable pelvic and bilateral hip radiographs. Electronically Signed   By: Simonne Come M.D.   On: 10/21/2016 10:30        Scheduled Meds: . aspirin EC  325 mg Oral Daily   Or  . aspirin  300 mg Rectal Daily  . atorvastatin  40 mg Oral q1800  . heparin  5,000 Units Subcutaneous Q8H  . hydrALAZINE  5 mg Intravenous Once  . insulin aspart  0-15 Units Subcutaneous TID WC  . insulin aspart  0-5 Units Subcutaneous QHS  . lipase/protease/amylase  36,000 Units Oral TID AC  . LORazepam  0.5 mg Intravenous Once  . LORazepam  1 mg Intravenous Once  . metoprolol  10 mg Intravenous Q6H   Continuous Infusions: . sodium chloride 75 mL/hr at 10/22/16 1016  . niCARDipine Stopped (10/21/16 0730)     LOS: 0 days    Time spent: 35 mins     Arshi Duarte Joline Maxcy, MD [facility] Pager [phone]   If 7PM-7AM, please contact night-coverage [email or web address] Password [email or web address] 10/22/2016, 1:30 PM

## 2016-10-22 NOTE — Evaluation (Signed)
Physical Therapy Evaluation Patient Details Name: Jamie KaufmannLinda Aguirre MRN: [id number] DOB: 1945-10-10 Today's Date: 10/22/2016   History of Present Illness  Jamie Aguirre is a 71 y.o. female with a Past Medical History of significant for 2 prior strokes approximately 15 years ago, chronic kidney disease, hypertension, hyperlipidemia who presents with sudden onset left-sided weakness since yesterday afternoon around 3 PM.  Refusing MRI;  has a pertinent past medical history of Arthritis;Chronic bronchitis; Chronic kidney disease;  Hyperlipidemia; Hypertension; Pellucid marginal degeneration of cornea; Stroke (HCC) (09/2005, 07/2006); and Type 2 diabetes mellitus (HCC).  Clinical Impression   Pt admitted with above diagnosis. Pt currently with functional limitations due to the deficits listed below (see PT Problem List). At baseline, pt is a limited ambulator, she mostly uses WC to get around; Notable LLE muscle activation during transfer training; recommending SNF for rehab to maximize independence and safety with mobility;  Pt will benefit from skilled PT to increase their independence and safety with mobility to allow discharge to the venue listed below.       Follow Up Recommendations SNF    Equipment Recommendations  Other (comment) (TBD at SNF)    Recommendations for Other Services OT consult;Speech consult     Precautions / Restrictions Precautions Precautions: Fall Restrictions Weight Bearing Restrictions: No      Mobility  Bed Mobility Overal bed mobility: Needs Assistance Bed Mobility: Supine to Sit     Supine to sit: +2 for physical assistance;Mod assist     General bed mobility comments: Used bed pad to assist feet off of bed and heavy mod assist to elevate trunk to sit; unstable initially in sitting requiring light mod assist for sitting balance, this improved with time  Transfers Overall transfer level: Needs assistance Equipment used: 2 person hand held  assist Transfers: Sit to/from Stand;Stand Pivot Transfers Sit to Stand: +2 physical assistance;Max assist Stand pivot transfers: +2 physical assistance;Max assist       General transfer comment: Stood with +2 assist; bilateral support at shoulder girdles and gait belt; L knee blocked for stability; performed basic pivot transfer to stronger R side, good reach for rail with RUE; LLE blocked for stabiltiy  Ambulation/Gait                Stairs            Wheelchair Mobility    Modified Rankin (Stroke Patients Only) Modified Rankin (Stroke Patients Only) Pre-Morbid Rankin Score: Slight disability Modified Rankin: Moderately severe disability     Balance Overall balance assessment: Needs assistance Sitting-balance support: Feet supported;Single extremity supported Sitting balance-Leahy Scale: Poor (approaching Fair) Sitting balance - Comments: initally requiring more assist, with time progressed to minguard    Standing balance support: Bilateral upper extremity supported Standing balance-Leahy Scale: Zero                               Pertinent Vitals/Pain Pain Assessment: Faces Faces Pain Scale: Hurts little more Pain Location: bil ankles when touched Pain Descriptors / Indicators: Grimacing Pain Intervention(s): Monitored during session;Other (comment) (avoided touching ankles, and informed pt if we had to)    Home Living Family/patient expects to be discharged to:: Private residence Living Arrangements: Alone Available Help at Discharge: Family;Available PRN/intermittently             Additional Comments: Plan for SNF for rehab    Prior Function Level of Independence: Independent with assistive device(s)  Comments: She does not walk much at baseline; I understand that she uses feet to scoot around in her wheelchair     Hand Dominance   Dominant Hand: Right    Extremity/Trunk Assessment   Upper Extremity Assessment Upper  Extremity Assessment: Defer to OT evaluation    Lower Extremity Assessment Lower Extremity Assessment: LLE deficits/detail LLE Deficits / Details: Very weak; hip flexor, knee flexion 2/5 tested supine; knee extension 2+/5; unable to test ankle; noted knee and hip extension muscle activation to stand when center of mass was translated over feet LLE: Unable to fully assess due to pain LLE Sensation: decreased light touch LLE Coordination: decreased fine motor;decreased gross motor       Communication   Communication: Expressive difficulties;Other (comment) (very labile and tangential)  Cognition Arousal/Alertness: Awake/alert Behavior During Therapy: Restless;Impulsive Overall Cognitive Status: No family/caregiver present to determine baseline cognitive functioning Area of Impairment: Problem solving;Attention   Current Attention Level: Sustained (extremely distractible)         Problem Solving: Decreased initiation;Difficulty sequencing;Requires verbal cues;Requires tactile cues General Comments: It is likely that some of her impulsivity is baseline    General Comments General comments (skin integrity, edema, etc.): tachycardic at times, with HR as high as 156 bpm; 90 bpm in chair at end of session    Exercises     Assessment/Plan    PT Assessment Patient needs continued PT services  PT Problem List Decreased strength;Decreased range of motion;Decreased activity tolerance;Decreased balance;Decreased mobility;Decreased knowledge of use of DME;Decreased knowledge of precautions;Obesity;Impaired tone       PT Treatment Interventions DME instruction;Gait training;Functional mobility training;Therapeutic activities;Therapeutic exercise;Balance training;Neuromuscular re-education;Cognitive remediation;Patient/family education;Wheelchair mobility training;Manual techniques    PT Goals (Current goals can be found in the Care Plan section)  Acute Rehab PT Goals Patient Stated Goal:  did not state PT Goal Formulation: With patient Time For Goal Achievement: 11/05/16 Potential to Achieve Goals: Good    Frequency Min 4X/week   Barriers to discharge Decreased caregiver support      Co-evaluation               End of Session Equipment Utilized During Treatment: Gait belt Activity Tolerance: Patient tolerated treatment well Patient left: in chair;with call bell/phone within reach;with chair alarm set Nurse Communication: Mobility status PT Visit Diagnosis: Hemiplegia and hemiparesis Hemiplegia - Right/Left: Left Hemiplegia - dominant/non-dominant: Non-dominant Hemiplegia - caused by: Cerebral infarction    Functional Assessment Tool Used: Clinical judgement Functional Limitation: Mobility: Walking and moving around Mobility: Walking and Moving Around Current Status (Z6109): At least 60 percent but less than 80 percent impaired, limited or restricted Mobility: Walking and Moving Around Goal Status [phone]): At least 1 percent but less than 20 percent impaired, limited or restricted    Time: 0981-1914 PT Time Calculation (min) (ACUTE ONLY): 36 min   Charges:   PT Evaluation $PT Eval Moderate Complexity: 1 Procedure PT Treatments $Therapeutic Activity: 8-22 mins   PT G Codes:   PT G-Codes **NOT FOR INPATIENT CLASS** Functional Assessment Tool Used: Clinical judgement Functional Limitation: Mobility: Walking and moving around Mobility: Walking and Moving Around Current Status (N8295): At least 60 percent but less than 80 percent impaired, limited or restricted Mobility: Walking and Moving Around Goal Status [phone]): At least 1 percent but less than 20 percent impaired, limited or restricted     Levi Aland 10/22/2016, 12:58 PM  Van Clines, PT  Acute Rehabilitation Services Pager [phone] Office [phone]

## 2016-10-22 NOTE — Progress Notes (Signed)
md notified pt sbp 215 after prn med given.  Pt anxious.  New orders received. Will continue to monitor Jamie Aguirre, Jamie Aguirre

## 2016-10-22 NOTE — Progress Notes (Signed)
OT Cancellation Note  Patient Details Name: Maryelizabeth KaufmannLinda Embree MRN: [id number] DOB: 1945/10/12   Cancelled Treatment:    Reason Eval/Treat Not Completed: Patient at procedure or test/ unavailable. Pt having ultrasound.   [facility] [place],[place]  Justyce Yeater, OT/L  [phone] 10/22/2016 10/22/2016, 1:36 PM

## 2016-10-22 NOTE — CV Procedure (Signed)
  Echocardiogram 2D Echocardiogram  Attempted, pt get anxious refused to continue the study.  Lorin Gawron 10/22/2016, 1:57 PM

## 2016-10-22 NOTE — Plan of Care (Signed)
It is my clinical opinion that admission to INPATIENT is reasonable and necessary in this 71 y.o. female . presenting with symptoms of unilateral weakness, concerning for CVA in setting of hypertensive emergency   . in the context of PMH including: CVA x 2; HTN, HLD . with pertinent positives on physical exam including: Left sided weakness . and pertinent positives on radiographic and laboratory data including: acute infarction in the left MCA territory . Workup and treatment include BP control and CVA work up and medical optimization.   Given the aforementioned, the predictability of an adverse outcome is felt to be significant. I expect that the patient will require at least 2 midnights in the hospital to treat this condition.

## 2016-10-23 ENCOUNTER — Inpatient Hospital Stay (HOSPITAL_COMMUNITY): Payer: Medicare Other

## 2016-10-23 DIAGNOSIS — I63311 Cerebral infarction due to thrombosis of right middle cerebral artery: Secondary | ICD-10-CM

## 2016-10-23 DIAGNOSIS — I63512 Cerebral infarction due to unspecified occlusion or stenosis of left middle cerebral artery: Secondary | ICD-10-CM

## 2016-10-23 LAB — GLUCOSE, CAPILLARY
GLUCOSE-CAPILLARY: 292 mg/dL — AB (ref 65–99)
GLUCOSE-CAPILLARY: 201 mg/dL — AB (ref 65–99)
Glucose-Capillary: 256 mg/dL — ABNORMAL HIGH (ref 65–99)
Glucose-Capillary: 202 mg/dL — ABNORMAL HIGH (ref 65–99)

## 2016-10-23 LAB — VAS US CAROTID
LCCADDIAS: -17 cm/s
LCCAPDIAS: 24 cm/s
LCCAPSYS: 113 cm/s
LEFT ECA DIAS: -10 cm/s
LEFT VERTEBRAL DIAS: -14 cm/s
LICAPSYS: -124 cm/s
Left CCA dist sys: -121 cm/s
Left ICA dist dias: -30 cm/s
Left ICA dist sys: -122 cm/s
Left ICA prox dias: -18 cm/s
RCCAPSYS: 81 cm/s
RIGHT ECA DIAS: -3 cm/s
RIGHT VERTEBRAL DIAS: 9 cm/s
Right CCA prox dias: 22 cm/s

## 2016-10-23 LAB — HEMOGLOBIN A1C
HEMOGLOBIN A1C: 9.3 % — AB (ref 4.8–5.6)
MEAN PLASMA GLUCOSE: 220 mg/dL

## 2016-10-23 MED ORDER — INSULIN ASPART 100 UNIT/ML ~~LOC~~ SOLN
0.0000 [IU] | Freq: Every day | SUBCUTANEOUS | Status: DC
  Administered 2016-10-23: 2 [IU] via SUBCUTANEOUS

## 2016-10-23 MED ORDER — ALBUTEROL SULFATE (2.5 MG/3ML) 0.083% IN NEBU
3.0000 mL | INHALATION_SOLUTION | RESPIRATORY_TRACT | Status: DC | PRN
  Administered 2016-10-24: 3 mL via RESPIRATORY_TRACT
  Filled 2016-10-23: qty 3

## 2016-10-23 MED ORDER — HYDRALAZINE HCL 25 MG PO TABS
25.0000 mg | ORAL_TABLET | Freq: Four times a day (QID) | ORAL | Status: DC
  Administered 2016-10-23 – 2016-10-27 (×16): 25 mg via ORAL
  Filled 2016-10-23 (×18): qty 1

## 2016-10-23 MED ORDER — DIAZEPAM 5 MG PO TABS
2.5000 mg | ORAL_TABLET | Freq: Two times a day (BID) | ORAL | Status: DC | PRN
  Administered 2016-10-23 – 2016-10-24 (×2): 5 mg via ORAL
  Filled 2016-10-23 (×2): qty 1

## 2016-10-23 MED ORDER — LORAZEPAM 2 MG/ML IJ SOLN
1.0000 mg | Freq: Once | INTRAMUSCULAR | Status: AC
  Administered 2016-10-23: 1 mg via INTRAVENOUS
  Filled 2016-10-23: qty 1

## 2016-10-23 MED ORDER — METOPROLOL SUCCINATE ER 100 MG PO TB24
100.0000 mg | ORAL_TABLET | Freq: Two times a day (BID) | ORAL | Status: DC
  Administered 2016-10-23 – 2016-10-27 (×8): 100 mg via ORAL
  Filled 2016-10-23 (×8): qty 1

## 2016-10-23 MED ORDER — INSULIN ASPART 100 UNIT/ML ~~LOC~~ SOLN
0.0000 [IU] | Freq: Three times a day (TID) | SUBCUTANEOUS | Status: DC
  Administered 2016-10-23 – 2016-10-24 (×2): 7 [IU] via SUBCUTANEOUS
  Administered 2016-10-24: 11 [IU] via SUBCUTANEOUS

## 2016-10-23 MED ORDER — ISOSORBIDE DINITRATE 10 MG PO TABS
10.0000 mg | ORAL_TABLET | Freq: Three times a day (TID) | ORAL | Status: DC
  Administered 2016-10-23 – 2016-10-27 (×12): 10 mg via ORAL
  Filled 2016-10-23 (×14): qty 1

## 2016-10-23 MED ORDER — METOPROLOL SUCCINATE ER 100 MG PO TB24: 100.0000 mg | ORAL_TABLET | Freq: Two times a day (BID) | ORAL | Status: DC

## 2016-10-23 MED ORDER — METOPROLOL SUCCINATE ER 100 MG PO TB24: 200.0000 mg | ORAL_TABLET | Freq: Two times a day (BID) | ORAL | Status: DC

## 2016-10-23 NOTE — Progress Notes (Signed)
Placed patient on CPAP via nasal mask, auto titrate settings (max 20) (min 5.0).

## 2016-10-23 NOTE — Progress Notes (Signed)
Inpatient Diabetes Program Recommendations  AACE/ADA: New Consensus Statement on Inpatient Glycemic Control (2015)  Target Ranges:  Prepandial:   less than 140 mg/dL      Peak postprandial:   less than 180 mg/dL (1-2 hours)      Critically ill patients:  140 - 180 mg/dL   Results for Jamie KaufmannMEISEL, Jamie Aguirre (MRN [id number]) as of 10/23/2016 10:11  Ref. Range 10/22/2016 08:54 10/22/2016 13:38 10/22/2016 17:10 10/22/2016 21:08 10/23/2016 07:54  Glucose-Capillary Latest Ref Range: 65 - 99 mg/dL 119307 (H) 147230 (H) 829330 (H) 321 (H) 292 (H)   Review of Glycemic Control  Diabetes history: DM 2 Outpatient Diabetes medications: Jentadueto 2.12-998 mg BID Current orders for Inpatient glycemic control: Novolog Moderate + HS scale  Inpatient Diabetes Program Recommendations:   Glucose in the 200-300 range. Consider starting Levemir 16-18 units Q 24 hours. May want to decrease correction scale due to elevated renal function to Novolog Sensitive.  Thanks,  Jamie DeemShannon Novali Vollman RN, MSN, [facility] Inpatient Diabetes Coordinator Team Pager [phone] (8a-5p)

## 2016-10-23 NOTE — Progress Notes (Signed)
[facility] TEAM 1 - Stepdown/ICU TEAM  Jamie Aguirre  ONG:[id number] DOB: 06-Jun-1946 DOA: 10/21/2016 PCP: Myrlene Broker, MD    Brief Narrative:  71 year old female with history of 2 CVAs, chronic kidney disease, hypertension, and HLD who presented with complaints of sudden onset of left-sided weakness. She stated she had been having intermittent stroke type symptoms for 2 or 3 weeks. She is on Plavix at home. At first she refused CT of the head but later this was able to be accomplished and showed evidence of infarction in left MCA region with some old lacunar infarcts. SBP was > 200 at presentation.    Subjective: The pt tells me she has had a very bad day so far.  She c/o B ankle pain, diffuse abdom pain, very poor appetite, severe generalized weakness, and severe anxiety.  There do not appear to have been any changes in regard to focal neurologic sx.  Her hx is somewhat difficult as she skips around frequently during the conversation, focusing frequently on her anxiety and "jitters."  Assessment & Plan:  Subacute ischemic stroke in MCA territory (R basal ganglia) Patient refused MRI brain - Neurology following - felt to likely be small vessel disease related - having L facial droop, L hemiplegia, increased tone on the left - now on ASA 325 + Plavix for 3 months, then Plavix alone - no signif ICA stenosis on B carotid dopplers   Multiple diffuse complaints - general failure to thrive No clear unifying diagnosis/etiology - check uric acid for ankle pain - manage BP - manage anxiety while trying to avoid sedation - check TSH  Hypertensive emergency - HTN Initially required Cardene drip - BP still poorly controlled - adjust medical tx and follow   Hyperlipidemia LDL 87 - Lipitor added   DM2 CBG poorly controlled - A1c 9.3 - adjust tx and follow   Chronic kidney disease stage III baseline crt 1.3  Recent Labs Lab 10/21/16 0254 10/21/16 0259 10/21/16 0854 10/22/16 0333    CREATININE 1.41* 1.30* 1.33* 1.37*    Obstructive sleep apnea CPAP as needed  Increasing urinary frequency UA not c/w urinary tract infection but + for glucosuria   DVT prophylaxis: SQ heparin  Code Status: FULL CODE Family Communication: spoke w/ POA at bedside  Disposition Plan: PT/OT suggesting SNF   Consultants:  Neurology   Antimicrobials:  none  Objective: Blood pressure (!) 171/94, pulse 86, temperature 97.4 F (36.3 C), temperature source Oral, resp. rate (!) 22, height 5\' 2"  (1.575 m), weight 106.6 kg (235 lb 0.2 oz), SpO2 96 %.  Intake/Output Summary (Last 24 hours) at 10/23/16 1341 Last data filed at 10/23/16 0400  Gross per 24 hour  Intake             1225 ml  Output              530 ml  Net              695 ml   Filed Weights   10/21/16 1030 10/22/16 0315 10/23/16 0350  Weight: 105.5 kg (232 lb 9.4 oz) 106.4 kg (234 lb 9.1 oz) 106.6 kg (235 lb 0.2 oz)    Examination: General: No acute respiratory distress - alert and conversant  Lungs: Clear to auscultation bilaterally without wheezes or crackles Cardiovascular: Regular rate and rhythm without murmur  Abdomen: Nontender, nondistended, soft, bowel sounds positive, no rebound, no ascites, no appreciable mass Extremities: trace B LE edema   CBC:  Recent Labs  Lab 10/21/16 0254 10/21/16 0259 10/21/16 0854 10/22/16 0333  WBC 11.9*  --  11.4* 11.8*  NEUTROABS 9.1*  --   --   --   HGB 12.8 13.3 12.1 12.9  HCT 39.1 39.0 37.0 39.0  MCV 87.5  --  86.2 86.5  PLT 281  --  274 324   Basic Metabolic Panel:  Recent Labs Lab 10/21/16 0254 10/21/16 0259 10/21/16 0854 10/22/16 0333  NA 135 137  --  135  K 4.6 4.7  --  3.9  CL 97* 100*  --  98*  CO2 25  --   --  22  GLUCOSE 254* 259*  --  274*  BUN 22* 27*  --  23*  CREATININE 1.41* 1.30* 1.33* 1.37*  CALCIUM 9.2  --   --  9.4   GFR: Estimated Creatinine Clearance: 44.5 mL/min (by C-G formula based on SCr of 1.37 mg/dL (H)).  Liver Function  Tests:  Recent Labs Lab 10/21/16 0254  AST 31  ALT 27  ALKPHOS 49  BILITOT 0.5  PROT 7.9  ALBUMIN 4.4    Coagulation Profile:  Recent Labs Lab 10/21/16 0254  INR 1.00    HbA1C: Hgb A1c MFr Bld  Date/Time Value Ref Range Status  10/22/2016 03:33 AM 9.3 (H) 4.8 - 5.6 % Final    Comment:    (NOTE)         Pre-diabetes: 5.7 - 6.4         Diabetes: >6.4         Glycemic control for adults with diabetes: <7.0   10/21/2016 08:54 AM 9.3 (H) 4.8 - 5.6 % Final    Comment:    (NOTE)         Pre-diabetes: 5.7 - 6.4         Diabetes: >6.4         Glycemic control for adults with diabetes: <7.0     CBG:  Recent Labs Lab 10/22/16 1338 10/22/16 1710 10/22/16 2108 10/23/16 0754 10/23/16 1207  GLUCAP 230* 330* 321* 292* 256*    Recent Results (from the past 240 hour(s))  MRSA PCR Screening     Status: None   Collection Time: 10/21/16 10:30 AM  Result Value Ref Range Status   MRSA by PCR NEGATIVE NEGATIVE Final    Comment:        The GeneXpert MRSA Assay (FDA approved for NASAL specimens only), is one component of a comprehensive MRSA colonization surveillance program. It is not intended to diagnose MRSA infection nor to guide or monitor treatment for MRSA infections.      Scheduled Meds: . aspirin EC  325 mg Oral Daily   Or  . aspirin  300 mg Rectal Daily  . atorvastatin  40 mg Oral q1800  . clopidogrel  75 mg Oral Daily  . heparin  5,000 Units Subcutaneous Q8H  . hydrALAZINE  5 mg Intravenous Once  . insulin aspart  0-15 Units Subcutaneous TID WC  . insulin aspart  0-5 Units Subcutaneous QHS  . lipase/protease/amylase  36,000 Units Oral TID AC  . LORazepam  0.5 mg Intravenous Once  . losartan  100 mg Oral Daily  . metoprolol succinate  100 mg Oral BID     LOS: 1 day   Lonia BloodJeffrey T. Kayslee Furey, MD [facility] Office  [phone] Pager - Text Page per Amion as per below:  On-Call/Text Page:      [email or web address]      password [email or web address]  If 7PM-7AM,  please contact night-coverage [email or web address] Password [facility] 10/23/2016, 1:41 PM

## 2016-10-23 NOTE — Progress Notes (Signed)
  Speech Language Pathology Treatment: Dysphagia  Patient Details Name: Maryelizabeth KaufmannLinda Sorey MRN: [id number] DOB: [birth year] Today's Date: 10/23/2016 Time: [id number] SLP Time Calculation (min) (ACUTE ONLY): 32 min  Assessment / Plan / Recommendation Clinical Impression  Pt demonatrates awareness of deficits, upon SLP arrival she reports, "Ally my drinks have to be thickened so I wont choke." After assurances that trying thin liquids with SLP is acceptable, She also states, "Well ik now if only take very small sips I can drink regular water without choking." Under subjective observation this appears to be accurate as no signs of aspiration with thin observed today. Will continue current diet today but plan for MBS for objective assessment tomorrow for potential diet upgrade and information for f/u SLP at next level of care.   HPI        SLP Plan  MBS       Recommendations  Diet recommendations: Dysphagia 2 (fine chop);Nectar-thick liquid Liquids provided via: Cup;Straw Medication Administration: Whole meds with puree Supervision: Staff to assist with self feeding;Full supervision/cueing for compensatory strategies Compensations: Slow rate;Small sips/bites;Lingual sweep for clearance of pocketing Postural Changes and/or Swallow Maneuvers: Seated upright 90 degrees                Oral Care Recommendations: Oral care BID Follow up Recommendations: Skilled Nursing facility SLP Visit Diagnosis: Dysphagia, unspecified (R13.10) Plan: MBS       GO                Javar Eshbach, Riley NearingBonnie Caroline 10/23/2016, 2:15 PM

## 2016-10-23 NOTE — Progress Notes (Signed)
Physical Therapy Treatment Patient Details Name: Jamie Aguirre MRN: [id number] DOB: 1946/08/07 Today's Date: 10/23/2016    History of Present Illness Jamie Aguirre is a 71 y.o. female with a Past Medical History of significant for 2 prior strokes approximately 15 years ago, chronic kidney disease, hypertension, hyperlipidemia who presents with sudden onset left-sided weakness since yesterday afternoon around 3 PM.  Refusing MRI;  has a pertinent past medical history of Arthritis;Chronic bronchitis; Chronic kidney disease;  Hyperlipidemia; Hypertension; Pellucid marginal degeneration of cornea; Stroke (HCC) (09/2005, 07/2006); and Type 2 diabetes mellitus (HCC).    PT Comments    Patient seen for therapy progression. Patient assisted to EOB with +2 increased assist. Patient appears more dysarthric and labile today with increased left sided tone compared to previous session. Patient BP elevated 219/ 100 MAP 134, held further mobility at this time. Nsg notified.    Follow Up Recommendations  SNF     Equipment Recommendations  Other (comment) (TBD at SNF)    Recommendations for Other Services OT consult;Speech consult     Precautions / Restrictions Precautions Precautions: Fall Restrictions Weight Bearing Restrictions: No    Mobility  Bed Mobility Overal bed mobility: Needs Assistance Bed Mobility: Rolling;Supine to Sit;Sit to Supine Rolling: Max assist   Supine to sit: Max assist;+2 for physical assistance Sit to supine: Max assist;+2 for physical assistance   General bed mobility comments: attempted EOB, use of bed pad and 2 person assist to elevate trunk to upright, and return to supine, max assist for repositioning  Transfers                 General transfer comment: will need +2 Assist  Ambulation/Gait                 Stairs            Wheelchair Mobility    Modified Rankin (Stroke Patients Only) Modified Rankin (Stroke Patients  Only) Pre-Morbid Rankin Score: Slight disability Modified Rankin: Severe disability     Balance Overall balance assessment: Needs assistance Sitting-balance support: Feet supported;Single extremity supported Sitting balance-Leahy Scale: Poor Sitting balance - Comments: R lateral lean when supported in bed. poor sense of midline                            Cognition Arousal/Alertness: Awake/alert Behavior During Therapy: Restless;Impulsive;Anxious Overall Cognitive Status: Impaired/Different from baseline Area of Impairment: Attention;Memory;Safety/judgement;Awareness;Problem solving   Current Attention Level: Sustained Memory: Decreased short-term memory   Safety/Judgement: Decreased awareness of safety;Decreased awareness of deficits Awareness: Emergent Problem Solving: Slow processing;Difficulty sequencing General Comments: patient with increased dysarthria today, continues to remain labile throughout session    Exercises      General Comments General comments (skin integrity, edema, etc.): 201/97 then 219/100 nsg aware      Pertinent Vitals/Pain Pain Assessment: Faces Faces Pain Scale: Hurts whole lot Pain Location: LLE - hip knee ankle Pain Descriptors / Indicators: Crying;Discomfort;Grimacing;Guarding Pain Intervention(s): Limited activity within patient's tolerance;Repositioned    Home Living                      Prior Function            PT Goals (current goals can now be found in the care plan section) Acute Rehab PT Goals Patient Stated Goal: to go back home PT Goal Formulation: With patient Time For Goal Achievement: 11/05/16 Potential to Achieve Goals: Good Progress towards PT  goals: Not progressing toward goals - comment    Frequency    Min 4X/week      PT Plan Current plan remains appropriate    Co-evaluation             End of Session Equipment Utilized During Treatment: Gait belt Activity Tolerance: Treatment  limited secondary to medical complications (Comment) (elevated BP, terminated session, nsg notified) Patient left: in bed;with call bell/phone within reach Nurse Communication: Mobility status PT Visit Diagnosis: Hemiplegia and hemiparesis Hemiplegia - Right/Left: Left Hemiplegia - dominant/non-dominant: Non-dominant Hemiplegia - caused by: Cerebral infarction     Time: 1610-9604: 0826-0847 PT Time Calculation (min) (ACUTE ONLY): 21 min  Charges:  $Therapeutic Activity: 8-22 mins                    G Codes:       Fabio AsaDevon J Starkisha Tullis 10/23/2016, 9:27 AM Charlotte Crumbevon Charlea Nardo, PT DPT  [phone]

## 2016-10-23 NOTE — Evaluation (Signed)
Speech Language Pathology Evaluation Patient Details Name: Jamie Aguirre MRN: [id number] DOB: 06-Nov-1945 Today's Date: 10/23/2016 Time: 4098-1191 SLP Time Calculation (min) (ACUTE ONLY): 31 min  Problem List:  Patient Active Problem List   Diagnosis Date Noted  . Hypertensive emergency 10/21/2016  . Stroke (HCC) 10/21/2016  . Urinary frequency 10/21/2016  . Left hip pain 10/21/2016  . Arthritis of left ankle 11/09/2015  . Abnormal PFTs (pulmonary function tests) 07/22/2015  . Chronic venous insufficiency 04/12/2015  . Routine general medical examination at a health care facility 08/27/2014  . Chronic diarrhea of unknown origin 05/15/2014  . Arthritis of right ankle 07/08/2013  . Depressive disorder 10/09/2012  . Vertigo   . OSA (obstructive sleep apnea)   . Obese 01/01/2012  . Type 2 diabetes mellitus (HCC)   . HTN (hypertension)   . Hyperlipidemia   . Acute ischemic left MCA stroke (HCC)   . Allergic rhinitis, seasonal    Past Medical History:  Past Medical History:  Diagnosis Date  . Allergic rhinitis, seasonal    assocaited with vertigo sensation  . Arthritis   . Asthma   . Chronic bronchitis   . Chronic kidney disease   . Diverticulitis   . Headaches, cluster   . Hives    idiopathic  . Hyperlipidemia   . Hypertension   . Pellucid marginal degeneration of cornea   . 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 Type 2 diabetes mellitus (HCC)    Past Surgical History:  Past Surgical History:  Procedure Laterality Date  . CATARACT EXTRACTION W/ INTRAOCULAR LENS IMPLANT Left 02/04/13   Dr Valere Dross   HPI:  Jamie Aguirre a 71 y.o.femalewith a Past Medical History of significant for 2 prior strokes approximately 15 years ago, chronic kidney disease, hypertension, hyperlipidemia who presents with sudden onset left-sided weakness since yesterday afternoon around 3 PM. Also has had intermittent stroke like symptoms intermittently for 2-3 weeks associated with  slurred speech and facial droop. 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 Head CT 10/21/16 Suspicious for acute infarction in the left MCA territory, nonhemorrhagic. No mass effect or midline shift. Moderate atrophy and chronic microvascular disease, with remote lacunar infarctions and remote right pons infarction. No prior swallowing evaluations in chart. SLP consult ordered for swallowing and cognitive linguistic evaluation.    Assessment / Plan / Recommendation Clinical Impression  Pt could not participate in formal cognitive linguistic assessment due to anxiety and perseveration on contacting [place], her [facility]. Despite this, pt demosntrated a mild to moderate dysarthria (improved after oral care given). Pt was 90% to 100% intelligible at conversation level however and independently practiced basic compensatory strategies for intelligiblity. Pt increased volume, slowed rate and over articulated without any cueing. When asked for a revision, pt then chunked words into 2 units and also used gestures to clarify. SLP reinforced these behaviors and encouraged pt to continue this during stay. Pts memory of complex biographical information and other long term memory was accurate, However more recent memory was less accurate. Pt did not recall that her [facility] had already visited her and was aware of her stroke and perseverated on this. Suspect that cognitive impairment is more related to pts anxiety than any direct neurologic insult. Will continue to follow to facilite functional communcaiton and speech intelligiblity.     SLP Assessment  SLP Recommendation/Assessment: Patient needs continued Speech Lanaguage Pathology Services SLP Visit Diagnosis: Dysarthria and anarthria (R47.1)  Follow Up Recommendations  Skilled Nursing facility    Frequency and Duration min 1 x/week  4 weeks      SLP Evaluation Cognition  Overall Cognitive Status: Impaired/Different from  baseline Arousal/Alertness: Awake/alert Orientation Level: Oriented to person;Oriented to place;Oriented to situation Attention: Sustained;Selective Sustained Attention: Appears intact Selective Attention: Impaired Selective Attention Impairment: Verbal complex Memory: Impaired Memory Impairment: Decreased short term memory Decreased Short Term Memory: Verbal complex Awareness: Appears intact Problem Solving: Appears intact Safety/Judgment: Appears intact       Comprehension  Auditory Comprehension Overall Auditory Comprehension: Appears within functional limits for tasks assessed    Expression Verbal Expression Overall Verbal Expression: Appears within functional limits for tasks assessed   Oral / Motor  Oral Motor/Sensory Function Overall Oral Motor/Sensory Function: Moderate impairment Facial ROM: Reduced left;Suspected CN VII (facial) dysfunction Facial Symmetry: Suspected CN VII (facial) dysfunction;Abnormal symmetry left Facial Strength: Reduced left;Suspected CN VII (facial) dysfunction Facial Sensation: Within Functional Limits Lingual ROM: Within Functional Limits Lingual Symmetry: Within Functional Limits Lingual Strength: Within Functional Limits Lingual Sensation: Within Functional Limits Velum: Within Functional Limits Mandible: Impaired;Suspected CN V (Trigeminal) dysfunction Motor Speech Overall Motor Speech: Impaired Respiration: Within functional limits Phonation: Hoarse Resonance: Within functional limits Articulation: Impaired Level of Impairment: Conversation Intelligibility: Intelligible Motor Planning: Witnin functional limits Motor Speech Errors: Aware Effective Techniques: Slow rate;Increased vocal intensity;Over-articulate   GO                   Harlon DittyBonnie Loveta Dellis, MA CCC-SLP [phone]  Claudine MoutonDeBlois, [name] 10/23/2016, 2:27 PM

## 2016-10-23 NOTE — Progress Notes (Addendum)
STROKE TEAM PROGRESS NOTE   SUBJECTIVE (INTERVAL HISTORY) No family is at the bedside.  She still has dysarthria, left facial droop, left hemiplegia with increased tone. Repeat CT confirmed right BG infarct.     OBJECTIVE Temp:  [97.5 F (36.4 C)-98.5 F (36.9 C)] 98.2 F (36.8 C) (03/12 0755) Pulse Rate:  [70-93] 75 (03/12 0755) Cardiac Rhythm: Normal sinus rhythm (03/12 0700) Resp:  [14-28] 14 (03/12 0755) BP: (160-229)/(71-104) 219/100 (03/12 0848) SpO2:  [90 %-98 %] 90 % (03/12 0755) Weight:  [235 lb 0.2 oz (106.6 kg)] 235 lb 0.2 oz (106.6 kg) (03/12 0350)  CBC:   Recent Labs Lab 10/21/16 0254  10/21/16 0854 10/22/16 0333  WBC 11.9*  --  11.4* 11.8*  NEUTROABS 9.1*  --   --   --   HGB 12.8  < > 12.1 12.9  HCT 39.1  < > 37.0 39.0  MCV 87.5  --  86.2 86.5  PLT 281  --  274 324  < > = values in this interval not displayed.  Basic Metabolic Panel:   Recent Labs Lab 10/21/16 0254 10/21/16 0259 10/21/16 0854 10/22/16 0333  NA 135 137  --  135  K 4.6 4.7  --  3.9  CL 97* 100*  --  98*  CO2 25  --   --  22  GLUCOSE 254* 259*  --  274*  BUN 22* 27*  --  23*  CREATININE 1.41* 1.30* 1.33* 1.37*  CALCIUM 9.2  --   --  9.4    Lipid Panel:     Component Value Date/Time   CHOL 186 10/22/2016 0333   TRIG 241 (H) 10/22/2016 0333   HDL 51 10/22/2016 0333   CHOLHDL 3.6 10/22/2016 0333   VLDL 48 (H) 10/22/2016 0333   LDLCALC 87 10/22/2016 0333   HgbA1c:  Lab Results  Component Value Date   HGBA1C 9.3 (H) 10/22/2016   Urine Drug Screen: No results found for: LABOPIA, COCAINSCRNUR, LABBENZ, AMPHETMU, THCU, LABBARB    IMAGING I have personally reviewed the radiological images below and agree with the radiology interpretations.  Ct Head Wo Contrast 10/21/2016 Suspicious for acute infarction in the left MCA territory, nonhemorrhagic. No mass effect or midline shift. Moderate atrophy and chronic microvascular disease, with remote lacunar infarctions and remote right  pons infarction.   CUS - Bilateral: 1-39% ICA stenosis. Vertebral artery flow is antegrade.  TTE - Left ventricle: Systolic function was normal. The estimated   ejection fraction was in the range of 55% to 60%. Images were   inadequate for LV wall motion assessment. Impressions: - Would recommend repeating an echocardiogram with contrast if   deemed clinically indicated.  TCD no window  Ct Head Wo Contrast 10/23/2016 IMPRESSION: 1. Evolving acute/early subacute ischemic nonhemorrhagic lacunar type infarct involving the right basal ganglia. 2. Otherwise stable appearance of the brain with underlying atrophy and chronic microvascular ischemic disease.   PHYSICAL EXAM  Temp:  [97.5 F (36.4 C)-98.5 F (36.9 C)] 98.2 F (36.8 C) (03/12 0755) Pulse Rate:  [70-93] 75 (03/12 0755) Resp:  [14-28] 14 (03/12 0755) BP: (160-229)/(71-104) 219/100 (03/12 0848) SpO2:  [90 %-98 %] 90 % (03/12 0755) Weight:  [235 lb 0.2 oz (106.6 kg)] 235 lb 0.2 oz (106.6 kg) (03/12 0350)  General - morbid obesity, well developed, not in acute distress.   Ophthalmologic - Fundi not visualized due to noncooperation.  Cardiovascular - Regular rate and rhythm.  Mental Status -  Level of arousal  and orientation to time, place, and person were intact. Language including expression, naming, repetition, comprehension was assessed and found intact, mild to moderate dysarthria Fund of Knowledge was assessed and was intact.  Cranial Nerves II - XII - II - Visual field intact OU. III, IV, VI - Extraocular movements intact. V - Facial sensation intact bilaterally. VII - left facial droop. VIII - Hearing & vestibular intact bilaterally. X - Palate elevates symmetrically, mild to moderate dysarthria. XI - Chin turning & shoulder shrug intact bilaterally. XII - Tongue protrusion intact.  Motor Strength - The patient's strength was normal in RUE and RLE, however, 0/5 LUE and LLE.  Bulk was normal and fasciculations  were absent.   Motor Tone - Muscle tone was assessed at the neck and appendages and was significantly increased on the left.  Reflexes - The patient's reflexes were 1+ in all extremities and she had positive left Babinski and triple reflex.  Sensory - Light touch, temperature/pinprick were assessed and were symmetrical.    Coordination - The patient had normal movements in the right hand with no ataxia or dysmetria.  Tremor was absent.  Gait and Station - deferred.   ASSESSMENT/PLAN [name] is a 71 y.o. female with history of a previous strokes, diabetes mellitus, hearing loss, hypertension, hyperlipidemia, cluster headaches, chronic kidney disease, chronic bronchitis, and asthma presenting with left-sided weakness and elevated blood pressure. She did not receive IV t-PA due to late presentation.  Stroke:  Possible acute right subcortical infarct in the left MCA territory - possible small vessel disease source due to stroke risk factors including obesity, HLD, and uncontrolled HTN and DM.  Resultant: Left facial droop, left hemiplegia, increased tone on the left  MRI /MRA - not performed - pt refused - claustrophobic  CT Head - no acute abnormalities  CTA head and neck - pt refused due to self claimed allergy-like reaction  Repeat CT head - right BG/CR infarct  Carotid Doppler unremarkable.   2D Echo - EF 55-60%  TCD lack of window to perform  LDL - 87  HgbA1c - 9.3  VTE prophylaxis - subcutaneous heparin DIET DYS 2 Room service appropriate? Yes; Fluid consistency: Nectar Thick  aspirin 81 mg daily and clopidogrel 75 mg daily prior to admission, now on ASA 325mg  and plavix. Continue DATP for 3 months and then plavix alone.   Patient counseled to be compliant with her antithrombotic medications  Ongoing aggressive stroke risk factor management  Therapy recommendations: SNF  Disposition: Pending  Hx of stroke  Left PCA infarct in 2007 - MRA showed left PCA  P2 occlusion  02/2015 - intermittent left jaw and right foot numbness, lasting 30 minutes each - A1c 7.2 and LDL 50 - [facility] aspirin Plavix and Lipitor  Hypertension / hypertensive urgency   Blood pressure running high  OK to gradually lower the BP to normotensive    on home meds - metoprolol 100mg  bid and losartan 100 mg daily  Hyperlipidemia  Home meds: Lipitor 20 mg daily prior to admission not resumed in hospital  LDL 87, goal < 70  Add Lipitor 40 mg daily   Continue statin at discharge  Diabetes  HgbA1c 9.3, goal < 7.0  Uncontrolled  SSI    CBG monitoring  Other Stroke Risk Factors  Advanced age  Former smoker  Obesity, Body mass index is 42.98 kg/m., recommend weight loss, diet and exercise as appropriate   Family hx stroke (  other)  Other Active Problems  CKD - creatinine 1.37 ; BUN 23  Mild leukocytosis - 11.8 (afebrile)  Follows with [facility]. Allena Katz at [facility] day # 1   Neurology will sign off. Please call with questions. Pt will follow up with Dr. Helyn Numbers at [facility] in about 4 weeks. Thanks for the consult.   Marvel Plan, MD PhD Stroke Neurology 10/23/2016 11:18 AM   To contact Stroke Continuity provider, please refer to [email or web address]. After hours, contact General Neurology

## 2016-10-23 NOTE — Progress Notes (Signed)
Patient refusing CPAP for tonight, stated that she could not stand the mask and felt like she was suffocating. CPAP is at bedside and RT made pt aware that if she changed her mind to call. RN at bedside.

## 2016-10-24 ENCOUNTER — Inpatient Hospital Stay (HOSPITAL_COMMUNITY): Payer: Medicare Other

## 2016-10-24 LAB — COMPREHENSIVE METABOLIC PANEL
ALT: 34 U/L (ref 14–54)
AST: 44 U/L — AB (ref 15–41)
Albumin: 3.8 g/dL (ref 3.5–5.0)
Alkaline Phosphatase: 42 U/L (ref 38–126)
Anion gap: 11 (ref 5–15)
BUN: 21 mg/dL — ABNORMAL HIGH (ref 6–20)
CHLORIDE: 103 mmol/L (ref 101–111)
CO2: 24 mmol/L (ref 22–32)
Calcium: 9 mg/dL (ref 8.9–10.3)
Creatinine, Ser: 1.3 mg/dL — ABNORMAL HIGH (ref 0.44–1.00)
GFR calc Af Amer: 47 mL/min — ABNORMAL LOW (ref >60)
GFR, EST NON AFRICAN AMERICAN: 41 mL/min — AB (ref >60)
Glucose, Bld: 222 mg/dL — ABNORMAL HIGH (ref 65–99)
POTASSIUM: 4.1 mmol/L (ref 3.5–5.1)
SODIUM: 138 mmol/L (ref 135–145)
Total Bilirubin: 1.2 mg/dL (ref 0.3–1.2)
Total Protein: 6.8 g/dL (ref 6.5–8.1)

## 2016-10-24 LAB — GLUCOSE, CAPILLARY
GLUCOSE-CAPILLARY: 262 mg/dL — AB (ref 65–99)
GLUCOSE-CAPILLARY: 188 mg/dL — AB (ref 65–99)
Glucose-Capillary: 241 mg/dL — ABNORMAL HIGH (ref 65–99)
Glucose-Capillary: 216 mg/dL — ABNORMAL HIGH (ref 65–99)

## 2016-10-24 LAB — CBC
HEMATOCRIT: 38.7 % (ref 36.0–46.0)
HEMOGLOBIN: 12.5 g/dL (ref 12.0–15.0)
MCH: 28.3 pg (ref 26.0–34.0)
MCHC: 32.3 g/dL (ref 30.0–36.0)
MCV: 87.8 fL (ref 78.0–100.0)
Platelets: 272 10*3/uL (ref 150–400)
RBC: 4.41 MIL/uL (ref 3.87–5.11)
RDW: 15.2 % (ref 11.5–15.5)
WBC: 10.4 10*3/uL (ref 4.0–10.5)

## 2016-10-24 LAB — AMMONIA: AMMONIA: 20 umol/L (ref 9–35)

## 2016-10-24 LAB — TSH: TSH: 10.143 u[IU]/mL — AB (ref 0.350–4.500)

## 2016-10-24 LAB — T4, FREE: Free T4: 1.36 ng/dL — ABNORMAL HIGH (ref 0.61–1.12)

## 2016-10-24 MED ORDER — INSULIN ASPART 100 UNIT/ML ~~LOC~~ SOLN
0.0000 [IU] | Freq: Every day | SUBCUTANEOUS | Status: DC
  Administered 2016-10-25: 5 [IU] via SUBCUTANEOUS
  Administered 2016-10-26: 2 [IU] via SUBCUTANEOUS

## 2016-10-24 MED ORDER — INSULIN DETEMIR 100 UNIT/ML ~~LOC~~ SOLN
16.0000 [IU] | Freq: Every day | SUBCUTANEOUS | Status: DC
  Administered 2016-10-24 – 2016-10-27 (×4): 16 [IU] via SUBCUTANEOUS
  Filled 2016-10-24 (×4): qty 0.16

## 2016-10-24 MED ORDER — INSULIN ASPART 100 UNIT/ML ~~LOC~~ SOLN
0.0000 [IU] | Freq: Three times a day (TID) | SUBCUTANEOUS | Status: DC
  Administered 2016-10-24 – 2016-10-25 (×2): 3 [IU] via SUBCUTANEOUS
  Administered 2016-10-25 (×2): 5 [IU] via SUBCUTANEOUS
  Administered 2016-10-26 (×2): 3 [IU] via SUBCUTANEOUS
  Administered 2016-10-26: 7 [IU] via SUBCUTANEOUS
  Administered 2016-10-27: 2 [IU] via SUBCUTANEOUS
  Administered 2016-10-27: 5 [IU] via SUBCUTANEOUS

## 2016-10-24 NOTE — Progress Notes (Signed)
Inpatient Diabetes Program Recommendations  AACE/ADA: New Consensus Statement on Inpatient Glycemic Control (2015)  Target Ranges:  Prepandial:   less than 140 mg/dL      Peak postprandial:   less than 180 mg/dL (1-2 hours)      Critically ill patients:  140 - 180 mg/dL   Lab Results  Component Value Date   GLUCAP 241 (H) 10/24/2016   HGBA1C 9.3 (H) 10/22/2016    Review of Glycemic Control Results for Jamie Aguirre, Jamie Aguirre (MRN [id number]) as of 10/24/2016 11:23  Ref. Range 10/23/2016 07:54 10/23/2016 12:07 10/23/2016 16:37 10/23/2016 22:17 10/24/2016 08:32  Glucose-Capillary Latest Ref Range: 65 - 99 mg/dL 409292 (H) 811256 (H) 914201 (H) 202 (H) 241 (H)   Diabetes history: DM 2 Outpatient Diabetes medications: Jentadueto 2.12-998 mg BID Current orders for Inpatient glycemic control: Novolog Moderate + HS scale  Inpatient Diabetes Program Recommendations:  Glucose in the 200-292 range.  Consider starting Levemir 16-18 units Q 24 hours. May want to decrease correction scale due to elevated renal function to Novolog Sensitive.  Thank you, Billy FischerJudy E. Tyreonna Czaplicki, RN, MSN, CDE Inpatient Glycemic Control Team Team Pager [phone] (8am-5pm) 10/24/2016 11:29 AM

## 2016-10-24 NOTE — Clinical Social Work Note (Signed)
Clinical Social Work Assessment  Patient Details  Name: Jamie KaufmannLinda Aguirre MRN: [id number] Date of Birth: 01-24-1946  Date of referral:  10/24/16               Reason for consult:  Facility Placement                Permission sought to share information with:  Facility Medical sales representativeContact Representative, Family Supports Permission granted to share information::  Yes, Verbal Permission Granted  Name::     Production assistant, radioTravis  Agency::  SNFS  Relationship::  HCPOA  Contact Information:     Housing/Transportation Living arrangements for the past 2 months:  Single Family Home Source of Information:  Patient, Power of Attorney Patient Interpreter Needed:  None Criminal Activity/Legal Involvement Pertinent to Current Situation/Hospitalization:  No - Comment as needed Significant Relationships:  Friend Lives with:  Self Do you feel safe going back to the place where you live?  No Need for family participation in patient care:  Yes (Comment)  Care giving concerns:  CSW received consult for possible SNF placement at time of discharge. CSW spoke with patient and her Jamie PertHCPOA, Jamie Aguirre, regarding PT recommendation of SNF placement at time of discharge. Patient reported that patient lives alone and is currently unable to care for herself at home given patient's current physical needs and fall risk. Patient expressed understanding of PT recommendation and is agreeable to SNF placement at time of discharge. CSW to continue to follow and assist with discharge planning needs.   Social Worker assessment / plan:  CSW spoke with patient concerning [facility] before [facility].  Employment status:  Retired Database administratornsurance information:  Managed Medicare PT Recommendations:  Skilled Nursing Facility Information / Referral to community resources:  Skilled Nursing Facility  Patient/Family's Response to care:  Patient recognizes need for rehab before returning home and [place]  Patient/Family's Understanding of and Emotional Response to Diagnosis, Current Treatment, and Prognosis:  Patient/family is realistic regarding therapy needs and expressed being hopeful for SNF placement. Patient reports that she gets sad when she thinks about how she is not able to physically do much anymore. CSW asked Chaplain to come visit with patient. Patient expressed understanding of CSW role and discharge process. No questions/concerns about plan or treatment.    Emotional Assessment Appearance:  Appears stated age Attitude/Demeanor/Rapport:  Other (Appropriate) Affect (typically observed):  Accepting, Appropriate Orientation:  Oriented to Self, Oriented to Place, Oriented to  Time, Oriented to Situation Alcohol / Substance use:  Not Applicable Psych involvement (Current and /or in the community):  No (Comment)  Discharge Needs  Concerns to be addressed:  Care Coordination Readmission within the last 30 days:  No Current discharge risk:  None Barriers to Discharge:  Continued Medical Work up   Jamie Aguirre, LCSWA 10/24/2016, 2:09 PM

## 2016-10-24 NOTE — Progress Notes (Signed)
Attempted to call report nurse in contact room will call me back. Landry DykeEmer Matheu Ploeger RN BSN MSN. 10/24/2016 @17 .30

## 2016-10-24 NOTE — Progress Notes (Signed)
PROGRESS NOTE    Jamie KaufmannLinda Aguirre  OZD:[id number] DOB: Dec 20, 1945 DOA: 10/21/2016 PCP: Myrlene BrokerElizabeth A Crawford, MD   Brief Narrative:  71 year old female with past medical history of 2 CVAs, chronic kidney disease, hypertension, lipidemia came with complaints of sudden onset of left-sided weakness starting the prior to her admission. She stated she had been having intermittent stroke type of symptoms for past 2 or 3 weeks. She is on Plavix at home. At first she was refusing CT of the head but later showed suspicion for  infarction in left MCA region with some old lacunar infarcts.   Assessment & Plan:   Principal Problem:   Acute ischemic left MCA stroke (HCC) Active Problems:   Type 2 diabetes mellitus (HCC)   HTN (hypertension)   Hyperlipidemia   OSA (obstructive sleep apnea)   Hypertensive emergency   Stroke [facility](HCC)   Urinary frequency   Left hip pain  Subacute-chronic ischemic stroke in left MCA territory -CT of the head was done _ PT rec - SNF -S&S - rec Dysphagia 3 diet -Supportive care -cont asa, plavix, statin.   Hypertensive emergency - resolved  -Monitor closely -cont home medications   Generalized Weakness -TSH is elevated; euthyroid sick syndrome? I will check t4.   Abdominal Pain Lower  -bladder scan over 500cc urine. Will order one time straight cath. If continues to retain then she may need Foley.  -check Abd XR as well.   Hyperlipidemia -Lipid panel -Continue statin and may need dose adjustment  Diabetes type 2 A1c 9.3 -Accu-Cheks, insulin sliding scale to sensitive  -diabetic coordinator consulted -Levemir 16 units added,   Chronic kidney disease stage III -Her creatinine appears to be at baseline of 1.3. In the past she has fluctuated from 1.1-1.4. -Avoid nephrotoxic drugs, monitor urine output. Monitor creatinine  Obstructive sleep apnea-CPAP as needed  Increasing urinary frequency-UA negative for urinary tract infection which showed  glucosuria.  DVT prophylaxis: Heparin Code Status: Full code Family Communication:   Disposition Plan: Tx to Telemetry.   Consultants:   Neuro  Procedures:   None  Antimicrobials:   None   Subjective: Reports of lower abd pain. She states she doesn't remember when she urinated last. Continues to have anxiety when asked and talk to her about her treatment plan.   Objective: Vitals:   10/24/16 0715 10/24/16 0730 10/24/16 0825 10/24/16 1100  BP: 129/68 127/69 (!) 184/97 (!) 145/78  Pulse: 67 68 85 92  Resp: 18 16 20  (!) 21  Temp:   97.9 F (36.6 C) 97.9 F (36.6 C)  TempSrc:   Oral Oral  SpO2: 95% 96% 97% 95%  Weight:      Height:        Intake/Output Summary (Last 24 hours) at 10/24/16 1618 Last data filed at 10/24/16 0300  Gross per 24 hour  Intake                0 ml  Output              300 ml  Net             -300 ml   Filed Weights   10/23/16 0350  Weight: 106.6 kg (235 lb 0.2 oz)    Examination:  General exam: Appears calm and comfortable  Respiratory system: Clear to auscultation. Respiratory effort normal. Cardiovascular system: S1 & S2 heard, RRR. No JVD, murmurs, rubs, gallops or clicks. No pedal edema. Gastrointestinal system: Abdomen is nondistended, soft and nontender. No organomegaly  or masses felt. Normal bowel sounds heard. Central nervous system: Alert and oriented. Left sided facial droop. dryarthria Extremities: 1/5 strength in LUE and LLE.  Skin: No rashes, lesions or ulcers Psychiatry: Judgement and insight appear normal. Mood & affect appropriate.     Data Reviewed:   CBC:  Recent Labs Lab 10/21/16 0254 10/21/16 0259 10/21/16 0854 10/22/16 0333 10/24/16 0257  WBC 11.9*  --  11.4* 11.8* 10.4  NEUTROABS 9.1*  --   --   --   --   HGB 12.8 13.3 12.1 12.9 12.5  HCT 39.1 39.0 37.0 39.0 38.7  MCV 87.5  --  86.2 86.5 87.8  PLT 281  --  274 324 272   Basic Metabolic Panel:  Recent Labs Lab 10/21/16 0254 10/21/16 0259  10/21/16 0854 10/22/16 0333 10/24/16 0257  NA 135 137  --  135 138  K 4.6 4.7  --  3.9 4.1  CL 97* 100*  --  98* 103  CO2 25  --   --  22 24  GLUCOSE 254* 259*  --  274* 222*  BUN 22* 27*  --  23* 21*  CREATININE 1.41* 1.30* 1.33* 1.37* 1.30*  CALCIUM 9.2  --   --  9.4 9.0   GFR: Estimated Creatinine Clearance: 46.9 mL/min (by C-G formula based on SCr of 1.3 mg/dL (H)). Liver Function Tests:  Recent Labs Lab 10/21/16 0254 10/24/16 0257  AST 31 44*  ALT 27 34  ALKPHOS 49 42  BILITOT 0.5 1.2  PROT 7.9 6.8  ALBUMIN 4.4 3.8   No results for input(s): LIPASE, AMYLASE in the last 168 hours.  Recent Labs Lab 10/24/16 0257  AMMONIA 20   Coagulation Profile:  Recent Labs Lab 10/21/16 0254  INR 1.00   Cardiac Enzymes: No results for input(s): CKTOTAL, CKMB, CKMBINDEX, TROPONINI in the last 168 hours. BNP (last 3 results) No results for input(s): PROBNP in the last 8760 hours. HbA1C:  Recent Labs  10/22/16 0333  HGBA1C 9.3*   CBG:  Recent Labs Lab 10/23/16 1207 10/23/16 1637 10/23/16 2217 10/24/16 0832 10/24/16 1140  GLUCAP 256* 201* 202* 241* 262*   Lipid Profile:  Recent Labs  10/22/16 0333  CHOL 186  HDL 51  LDLCALC 87  TRIG 241*  CHOLHDL 3.6   Thyroid Function Tests:  Recent Labs  10/24/16 0257 10/24/16 0919  TSH 10.143*  --   FREET4  --  1.36*   Anemia Panel: No results for input(s): VITAMINB12, FOLATE, FERRITIN, TIBC, IRON, RETICCTPCT in the last 72 hours. Sepsis Labs: No results for input(s): PROCALCITON, LATICACIDVEN in the last 168 hours.  Recent Results (from the past 240 hour(s))  MRSA PCR Screening     Status: None   Collection Time: 10/21/16 10:30 AM  Result Value Ref Range Status   MRSA by PCR NEGATIVE NEGATIVE Final    Comment:        The GeneXpert MRSA Assay (FDA approved for NASAL specimens only), is one component of a comprehensive MRSA colonization surveillance program. It is not intended to diagnose  MRSA infection nor to guide or monitor treatment for MRSA infections.          Radiology Studies: Ct Head Wo Contrast  Result Date: 10/23/2016 CLINICAL DATA:  Initial evaluation for left arm and leg weakness. EXAM: CT HEAD WITHOUT CONTRAST TECHNIQUE: Contiguous axial images were obtained from the base of the skull through the vertex without intravenous contrast. COMPARISON:  Prior CT from 10/21/2016. FINDINGS: Brain: Now  evident is an evolving acute/early subacute ischemic infarcts involving the posterior right lentiform nucleus extending towards the right lateral ventricle (series 201, image 20). This is increased in prominence relative to prior exam. No significant mass effect. No associated hemorrhage. No other acute or evolving infarct identified. Gray-white matter differentiation otherwise maintained. No acute intracranial hemorrhage. Underlying cerebral atrophy with mild chronic small vessel ischemic disease. Additional remote lacunar infarcts noted within the pons and right basal ganglia. No mass lesion, midline shift or mass effect. No hydrocephalus. No extra-axial fluid collection. Vascular: No hyperdense vessel. Extensive intracranial atherosclerosis noted. Skull: Scalp soft tissues demonstrate no acute abnormality. Calvarium intact. Sinuses/Orbits: Globes and orbital soft tissues within normal limits. Patient status post lens extraction bilaterally. Paranasal sinuses and mastoids are clear. Other: None. IMPRESSION: 1. Evolving acute/early subacute ischemic nonhemorrhagic lacunar type infarct involving the right basal ganglia. 2. Otherwise stable appearance of the brain with underlying atrophy and chronic microvascular ischemic disease. Electronically Signed   By: Rise Mu M.D.   On: 10/23/2016 05:48        Scheduled Meds: . aspirin EC  325 mg Oral Daily  . atorvastatin  40 mg Oral q1800  . clopidogrel  75 mg Oral Daily  . heparin  5,000 Units Subcutaneous Q8H  .  hydrALAZINE  25 mg Oral Q6H  . insulin aspart  0-20 Units Subcutaneous TID WC  . insulin aspart  0-5 Units Subcutaneous QHS  . isosorbide dinitrate  10 mg Oral TID  . lipase/protease/amylase  36,000 Units Oral TID AC  . losartan  100 mg Oral Daily  . metoprolol succinate  100 mg Oral BID   Continuous Infusions:    LOS: 2 days    Time spent: 35 mins     Ankit Joline Maxcy, MD [facility] Pager [phone]   If 7PM-7AM, please contact night-coverage [email or web address] Password [facility] 10/24/2016, 4:18 PM

## 2016-10-24 NOTE — Progress Notes (Signed)
Modified Barium Swallow Progress Note  Patient Details  Name: Jamie KaufmannLinda Aguirre MRN: [id number] Date of Birth: 07-25-1946  Today's Date: 10/24/2016  Modified Barium Swallow completed.  Full report located under Chart Review in the Imaging Section.  Brief recommendations include the following:  Clinical Impression  Pt demonstrates a mild oral dysphagia due to loss of lingual sensation and buccal tension on the left leaving minimal residuals with thin liquids that spill to the pharynx without immediate sensation. Pt did have good sensation of frank penetration events with large sips and immediately ejected material. One instance of silent aspiration occurred when taking mixed consistency (pill with thin) due to large consecutive bolus with poorly coordinated pharyngeal transit. Recommend pt resume a dys 3 (mechanical soft) diet due to ongoing oral residuals and thin liquids. Pills to be given whole in pudding. Will follow to reinforce recommendation of second swallow to clear oral residue as well as basic aspiration and esophageal precautions given signs of a suspected esophageal dysphagia.    Swallow Evaluation Recommendations   Recommended Consults: Consider esophageal assessment   SLP Diet Recommendations: Dysphagia 3 (Mech soft) solids;Thin liquid   Liquid Administration via: Cup;Straw   Medication Administration: Whole meds with puree   Supervision: Staff to assist with self feeding   Compensations: Slow rate;Small sips/bites;Lingual sweep for clearance of pocketing;Multiple dry swallows after each bite/sip   Postural Changes: Seated upright at 90 degrees   Oral Care Recommendations: Oral care BID       Harlon DittyBonnie Chavon Lucarelli, MA CCC-SLP [phone]  Claudine MoutonDeBlois, Jeniel Slauson Caroline 10/24/2016,1:28 PM

## 2016-10-24 NOTE — [place] FL2 (Signed)
MEDICAID FL2 LEVEL OF CARE SCREENING TOOL     IDENTIFICATION  Patient Name: Jamie KaufmannLinda Aguirre Birthdate: [birth year] Sex: female Admission Date (Current Location): 10/21/2016  [facility] [place]   Facility and Address:  [facility] [place], [address], [place], [place] [address]      Provider Number: [id number]  Attending Physician Name and Address:  Ankit Joline Maxcyhirag Amin, MD  Relative Name and Phone Number:  Antonieta Pertravis, HCPOA, [phone]    Current Level of Care: Hospital Recommended Level of Care: Skilled Nursing Facility Prior Approval Number:    Date Approved/Denied:   PASRR Number: [id number]  Discharge Plan: SNF    Current Diagnoses: Patient Active Problem List   Diagnosis Date Noted  . Hypertensive emergency 10/21/2016  . Stroke (HCC) 10/21/2016  . Urinary frequency 10/21/2016  . Left hip pain 10/21/2016  . Arthritis of left ankle 11/09/2015  . Abnormal PFTs (pulmonary function tests) 07/22/2015  . Chronic venous insufficiency 04/12/2015  . Routine general medical examination at a health care facility 08/27/2014  . Chronic diarrhea of unknown origin 05/15/2014  . Arthritis of right ankle 07/08/2013  . Depressive disorder 10/09/2012  . Vertigo   . OSA (obstructive sleep apnea)   . Obese 01/01/2012  . Type 2 diabetes mellitus (HCC)   . HTN (hypertension)   . Hyperlipidemia   . Acute ischemic left MCA stroke (HCC)   . Allergic rhinitis, seasonal     Orientation RESPIRATION BLADDER Height & Weight     Self, Time, Situation, Place  O2 (Cpap at night) Incontinent Weight:  (unable to get daily weight, bed weight fctn broken) Height:  5\' 2"  (157.5 cm)  BEHAVIORAL SYMPTOMS/MOOD NEUROLOGICAL BOWEL NUTRITION STATUS      Continent Diet (Please see DC Summary)  AMBULATORY STATUS COMMUNICATION OF NEEDS Skin   Extensive Assist Verbally Normal                       Personal Care Assistance Level of Assistance   Bathing, Feeding, Dressing Bathing Assistance: Maximum assistance Feeding assistance: Limited assistance Dressing Assistance: Maximum assistance     Functional Limitations Info  Hearing, Sight Sight Info: Impaired Hearing Info: Impaired      SPECIAL CARE FACTORS FREQUENCY  PT (By licensed PT), OT (By licensed OT)     PT Frequency: 5x/week OT Frequency: 3x/week            Contractures      Additional Factors Info  Code Status, Allergies, Insulin Sliding Scale Code Status Info: Full Allergies Info: Codeine, Penicillins, Ciprofloxacin, Lactose Intolerance (Gi), Latex, Spinach, Sulfa Antibiotics, Tape   Insulin Sliding Scale Info: 3x daily with meals and at bedtime       Current Medications (10/24/2016):  This is the current hospital active medication list Current Facility-Administered Medications  Medication Dose Route Frequency Provider Last Rate Last Dose  . acetaminophen (TYLENOL) tablet 650 mg  650 mg Oral Q6H PRN Isaiah BlakesMarina S Kyazimova, PA-C   650 mg at 10/24/16 1214   Or  . acetaminophen (TYLENOL) suppository 650 mg  650 mg Rectal Q6H PRN Isaiah BlakesMarina S Kyazimova, PA-C      . albuterol (PROVENTIL) (2.5 MG/3ML) 0.083% nebulizer solution 3 mL  3 mL Inhalation Q2H PRN Lonia BloodJeffrey T McClung, MD      . aspirin EC tablet 325 mg  325 mg Oral Daily Marvel PlanJindong Xu, MD   325 mg at 10/24/16 [id number]  . atorvastatin (LIPITOR) tablet 40 mg  40 mg Oral q1800 David L Rinehuls, PA-C   40 mg at 10/23/16 1815  . clopidogrel (PLAVIX) tablet 75 mg  75 mg Oral Daily Marvel Plan, MD   75 mg at 10/24/16 0921  . diazepam (VALIUM) tablet 2.5-5 mg  2.5-5 mg Oral Q12H PRN Lonia Blood, MD   5 mg at 10/23/16 2026  . heparin injection 5,000 Units  5,000 Units Subcutaneous [address] Allison, PA-C   5,000 Units at 10/24/16 1610  . hydrALAZINE (APRESOLINE) injection 5 mg  5 mg Intravenous Q4H PRN Isaiah Blakes, PA-C   5 mg at 10/24/16 0351  . hydrALAZINE (APRESOLINE) tablet 25 mg  25 mg Oral Q6H Lonia Blood, MD   25 mg at 10/24/16 0604  . insulin aspart (novoLOG) injection 0-20 Units  0-20 Units Subcutaneous TID WC Lonia Blood, MD   11 Units at 10/24/16 1215  . insulin aspart (novoLOG) injection 0-5 Units  0-5 Units Subcutaneous QHS Lonia Blood, MD   2 Units at 10/23/16 2253  . isosorbide dinitrate (ISORDIL) tablet 10 mg  10 mg Oral TID Lonia Blood, MD   10 mg at 10/24/16 9604  . lipase/protease/amylase (CREON) capsule 36,000 Units  36,000 Units Oral TID [facility] Isaiah Blakes, PA-C   36,000 Units at 10/23/16 1720  . losartan (COZAAR) tablet 100 mg  100 mg Oral Daily Ankit Joline Maxcy, MD   100 mg at 10/24/16 5409  . metoprolol succinate (TOPROL-XL) 24 hr tablet 100 mg  100 mg Oral BID Lonia Blood, MD   100 mg at 10/24/16 8119  . ondansetron (ZOFRAN) tablet 4 mg  4 mg Oral Q6H PRN Isaiah Blakes, PA-C       Or  . ondansetron [facility]) injection 4 mg  4 mg Intravenous Q6H PRN Isaiah Blakes, PA-C      . polyethylene glycol (MIRALAX / GLYCOLAX) packet 17 g  17 g Oral Daily PRN Isaiah Blakes, PA-C      . RESOURCE THICKENUP CLEAR   Oral PRN Pete Glatter, MD         Discharge Medications: Please see discharge summary for a list of discharge medications.  Relevant Imaging Results:  Relevant Lab Results:   Additional Information SSN: [id number]    Pt will bring her CPAP from home.  Mearl Latin, LCSWA

## 2016-10-24 NOTE — Progress Notes (Signed)
Occupational Therapy Treatment Patient Details Name: Joselyne Spake MRN: [id number] DOB: [birth year] Today's Date: 10/24/2016    History of present illness Hollynn Garno is a 71 y.o. female with a Past Medical History of significant for 2 prior strokes approximately 15 years ago, chronic kidney disease, hypertension, hyperlipidemia who presents with sudden onset left-sided weakness since yesterday afternoon around 3 PM.  Refusing MRI;  has a pertinent past medical history of Arthritis;Chronic bronchitis; Chronic kidney disease;  Hyperlipidemia; Hypertension; Pellucid marginal degeneration of cornea; Stroke (HCC) (09/2005, 07/2006); and Type 2 diabetes mellitus (HCC).   OT comments  Pt primarily concerned with pain in her L eye, noted discharge. Pt continues to demonstrate dense L side weakness with impaired sensation. Continues to require 2 person assist for mobility. Pt performed grooming at bed level with moderate assistance. Pt emotionally labile at times. She wants to maximize therapy and get as strong as she can. Will continue to follow.  Follow Up Recommendations  SNF;Supervision/Assistance - 24 hour    Equipment Recommendations  3 in 1 bedside commode    Recommendations for Other Services      Precautions / Restrictions Precautions Precautions: Fall Precaution Comments: dense L hemi       Mobility Bed Mobility Overal bed mobility: Needs Assistance Bed Mobility: Rolling;Supine to Sit;Sit to Supine Rolling: Max assist         General bed mobility comments: +2 total to pull up in bed  Transfers                      Balance                                   ADL Overall ADL's : Needs assistance/impaired     Grooming: Wash/dry hands;Wash/dry face;Oral care;Bed level;Moderate assistance Grooming Details (indicate cue type and reason): assist to locate and place L UE to reach hand for washing                                       Vision                 Additional Comments: pt with difficulty opening L eye, with matted eye lashes/discharge, RN aware, reports "foggy" vision   Perception     Praxis      Cognition   Behavior During Therapy: Anxious Overall Cognitive Status: Impaired/Different from baseline Area of Impairment: Attention;Safety/judgement;Awareness;Problem solving   Current Attention Level: Sustained        Awareness: Emergent Problem Solving: Slow processing;Difficulty sequencing General Comments: pt with lability, able to recall therapist's name after one introduction      Exercises Other Exercises Other Exercises: PROM L UE, flaccid tone, pt reports it feeling like pins and needles Other Exercises: Educated pt in protecting L UE from injury with mobility and positioning on pillow in bed and chair   Shoulder Instructions       General Comments      Pertinent Vitals/ Pain       Pain Assessment: Faces Faces Pain Scale: Hurts whole lot Pain Location: L eye Pain Descriptors / Indicators: Burning;Constant;Grimacing Pain Intervention(s): Monitored during session (applied warm compresses)  Home Living  Prior Functioning/Environment              Frequency  Min 2X/week        Progress Toward Goals  OT Goals(current goals can now be found in the care plan section)  Progress towards OT goals: Progressing toward goals  Acute Rehab OT Goals Patient Stated Goal: to get as strong as I can Time For Goal Achievement: 11/05/16 Potential to Achieve Goals: Good  Plan Discharge plan remains appropriate    Co-evaluation                 End of Session    OT Visit Diagnosis: Muscle weakness (generalized) (M62.81);Low vision, both eyes (H54.2);Hemiplegia and hemiparesis;Unsteadiness on feet (R26.81);Pain;Other symptoms and signs involving cognitive function Hemiplegia - Right/Left: Left Hemiplegia -  dominant/non-dominant: Non-Dominant Hemiplegia - caused by: Unspecified Pain - Right/Left: Left Pain - part of body:  (eye)   Activity Tolerance Patient tolerated treatment well   Patient Left in bed;with call bell/phone within reach;with bed alarm set   Nurse Communication          Time: [id number] OT Time Calculation (min): 28 min  Charges: OT General Charges $OT Visit: 1 Procedure OT Treatments $Self Care/Home Management : 8-22 mins $Neuromuscular Re-education: 8-22 mins   Evern BioMayberry, Illona Bulman Lynn 10/24/2016, 3:45 PM  [phone]

## 2016-10-25 LAB — GLUCOSE, CAPILLARY
GLUCOSE-CAPILLARY: 293 mg/dL — AB (ref 65–99)
GLUCOSE-CAPILLARY: 272 mg/dL — AB (ref 65–99)
GLUCOSE-CAPILLARY: 291 mg/dL — AB (ref 65–99)
Glucose-Capillary: 258 mg/dL — ABNORMAL HIGH (ref 65–99)

## 2016-10-25 LAB — T4: T4, Total: 10 ug/dL (ref 4.5–12.0)

## 2016-10-25 NOTE — Consult Note (Signed)
[facility] CM Primary Care Navigator  10/25/2016  Jamie Aguirre 28-Dec-1945 [id number]   Met with patient and friend Jamie Aguirre) at the bedside to identify possible discharge needs. Friend reports sudden left side weakness (with history of stroke) that had led to this admission.  Patient mentioned seeing Dr. Pricilla Holm (primary care provider) with [facility] at [place] but unable to remember when she was last seen by her.     Patient shared using [facility] to obtain medications without difficulty.   Patient states managing her own medications at home prior to this admission.   Per friend, patient was living alone and able to drive to her doctors' appointments before this hospitalization.  Patient has friends who help and support her with care at home. She also has private caregivers who assist with care according to friend.  Patient has a friend and Jamie Aguirre) as reported.  Inpatient diabetes coordinator had been consulted per MD note. Anticipated discharge plan is skilled nursing facility placement per friend, since patient was living alone and is currently unable to care for herself at home with her current physical needs and fall risk.  Patient was agreeable to SNF placement (still in process) at time of discharge per inpatient social worker note.   For additional questions please contact:  Edwena Felty A. Kinesha Auten, BSN, RN-BC [facility] PRIMARY CARE Navigator Cell: [phone]

## 2016-10-25 NOTE — Progress Notes (Signed)
Patient a transfer from [facility]N with dx of strokes with left sided paralysis and intermittent confusion .reoriented and redirected to unit and pt room safety measures explained to pt. Call bed to rt side to call as needed. Noted pt also very anxious  valium 5 mg po given with some effect.  Nsg staff continue to round on pt hourly and as needed for pt's safety.

## 2016-10-25 NOTE — Progress Notes (Signed)
Inpatient Diabetes Program Recommendations  AACE/ADA: New Consensus Statement on Inpatient Glycemic Control (2015)  Target Ranges:  Prepandial:   less than 140 mg/dL      Peak postprandial:   less than 180 mg/dL (1-2 hours)      Critically ill patients:  140 - 180 mg/dL   Results for Jamie Aguirre, Jamie Aguirre (MRN [id number]) as of 10/25/2016 11:46  Ref. Range 10/24/2016 08:32 10/24/2016 11:40 10/24/2016 16:36 10/24/2016 21:54  Glucose-Capillary Latest Ref Range: 65 - 99 mg/dL 213241 (H) 086262 (H) 578216 (H) 188 (H)   Results for Jamie Aguirre, Jamie Aguirre (MRN [id number]) as of 10/25/2016 11:46  Ref. Range 10/25/2016 06:32 10/25/2016 11:39  Glucose-Capillary Latest Ref Range: 65 - 99 mg/dL 413293 (H) 244272 (H)    Home DM Meds: Jentadueto 2.12/998 mg BID  Current Insulin Orders: Levemir 16 units daily      Novolog Sensitive Correction Scale/ SSI (0-9 units) TID AC + HS     MD- Please consider the following in-hospital insulin adjustments:  1. Increase Levemir to 20 units daily (~0.2 units/kg dosing based on weight of 106 kg)  2. Start Novolog Meal Coverage: Novolog 4 units TID with meals (hold if pt eats <50% of meal)     --Will follow patient during hospitalization--  Ambrose FinlandJeannine Johnston Jerre Vandrunen RN, MSN, CDE Diabetes Coordinator Inpatient Glycemic Control Team Team Pager: [phone] (8a-5p)

## 2016-10-25 NOTE — Care Management Note (Signed)
Case Management Note  Patient Details  Name: Maryelizabeth KaufmannLinda Perlow MRN: [id number] Date of Birth: 1945-11-26  Subjective/Objective:                    Action/Plan: Plan is for SNF when medically ready. Patients POA [name]([name]) asking to speak to CSW. Information relayed to BillingsJeneya CSW. CM following.   Expected Discharge Date:  10/23/16               Expected Discharge Plan:  Skilled Nursing Facility  In-House Referral:  Clinical Social Work  Discharge planning Services  CM Consult  Post Acute Care Choice:    Choice offered to:     DME Arranged:    DME Agency:     HH Arranged:    HH Agency:     Status of Service:  In process, will continue to follow  If discussed at Long Length of Stay Meetings, dates discussed:    Additional Comments:  Kermit BaloKelli F Renezmae Canlas, RN 10/25/2016, 11:37 AM

## 2016-10-25 NOTE — Progress Notes (Signed)
Patient refusing the use of CPAP tonight.

## 2016-10-25 NOTE — Progress Notes (Signed)
Physical Therapy Treatment Patient Details Name: Jamie KaufmannLinda Aguirre MRN: [id number] DOB: August 22, 1945 Today's Date: 10/25/2016    History of Present Illness Jamie Aguirre is a 71 y.o. female with a Past Medical History of significant for 2 prior strokes approximately 15 years ago, chronic kidney disease, hypertension, hyperlipidemia who presents with sudden onset left-sided weakness since yesterday afternoon around 3 PM.  Refusing MRI;  has a pertinent past medical history of Arthritis;Chronic bronchitis; Chronic kidney disease;  Hyperlipidemia; Hypertension; Pellucid marginal degeneration of cornea; Stroke (HCC) (09/2005, 07/2006); and Type 2 diabetes mellitus (HCC).    PT Comments    Difficult for pt to remain on task. Easily distracted. Decreased safety awareness with high fall risk. Frequency decreased to 3 x week.  Follow Up Recommendations  SNF     Equipment Recommendations  Other (comment) (TBD at SNF)    Recommendations for Other Services       Precautions / Restrictions Precautions Precautions: Fall;Other (comment) Precaution Comments: dense L hemi Restrictions Weight Bearing Restrictions: No    Mobility  Bed Mobility         Supine to sit: Max assist;+2 for safety/equipment     General bed mobility comments: increased time to maintain balance in sitting EOB  Transfers   Equipment used: 2 person hand held assist   Sit to Stand: +2 physical assistance;Mod assist Stand pivot transfers: +2 physical assistance;Mod assist       General transfer comment: pivot transfer toward right  Ambulation/Gait             General Gait Details: unable   Stairs            Wheelchair Mobility    Modified Rankin (Stroke Patients Only) Modified Rankin (Stroke Patients Only) Pre-Morbid Rankin Score: Slight disability Modified Rankin: Severe disability     Balance   Sitting-balance support: Feet supported;Single extremity supported Sitting balance-Leahy Scale:  Poor Sitting balance - Comments: R lateral lean   Standing balance support: Bilateral upper extremity supported;During functional activity Standing balance-Leahy Scale: Zero                      Cognition Arousal/Alertness: Awake/alert Behavior During Therapy: Anxious;Restless Overall Cognitive Status: Impaired/Different from baseline Area of Impairment: Following commands   Current Attention Level: Sustained Memory: Decreased short-term memory Following Commands: Follows one step commands with increased time;Follows one step commands inconsistently Safety/Judgement: Decreased awareness of safety;Decreased awareness of deficits Awareness: Emergent Problem Solving: Slow processing;Difficulty sequencing;Requires verbal cues;Requires tactile cues      Exercises      General Comments        Pertinent Vitals/Pain Pain Assessment: Faces Faces Pain Scale: No hurt    Home Living                      Prior Function            PT Goals (current goals can now be found in the care plan section) Acute Rehab PT Goals Patient Stated Goal: to get as strong as I can PT Goal Formulation: With patient Time For Goal Achievement: 11/05/16 Potential to Achieve Goals: Good Progress towards PT goals: Progressing toward goals    Frequency    Min 3X/week      PT Plan Frequency needs to be updated    Co-evaluation             End of Session Equipment Utilized During Treatment: Gait belt Activity Tolerance: Patient tolerated treatment well Patient  left: in chair;with call bell/phone within reach;with chair alarm set Nurse Communication: Mobility status PT Visit Diagnosis: Hemiplegia and hemiparesis Hemiplegia - Right/Left: Left Hemiplegia - dominant/non-dominant: Non-dominant Hemiplegia - caused by: Cerebral infarction     Time: 1610-9604 PT Time Calculation (min) (ACUTE ONLY): 26 min  Charges:  $Therapeutic Activity: 23-37 mins                     G Codes:       Ilda Foil 10/25/2016, 10:04 AM

## 2016-10-25 NOTE — Progress Notes (Signed)
PROGRESS NOTE    Jamie Aguirre  ZOX:[id number] DOB: 10-03-1945 DOA: 10/21/2016 PCP: Myrlene Broker, MD   Brief Narrative:  71 year old female with past medical history of 2 CVAs, chronic kidney disease, hypertension, lipidemia came with complaints of sudden onset of left-sided weakness starting the prior to her admission. She stated she had been having intermittent stroke type of symptoms for past 2 or 3 weeks. She is on Plavix at home. At first she was refusing CT of the head but later showed suspicion for  infarction in left MCA region with some old lacunar infarcts.   Assessment & Plan:   Principal Problem:   Acute ischemic left MCA stroke (HCC) Active Problems:   Type 2 diabetes mellitus (HCC)   HTN (hypertension)   Hyperlipidemia   OSA (obstructive sleep apnea)   Hypertensive emergency   Stroke [facility])   Urinary frequency   Left hip pain  Subacute-chronic ischemic stroke in left MCA territory -CT of the head was done _ PT rec - SNF -S&S - rec Dysphagia 3 diet -Supportive care -cont asa, plavix, statin.   Hypertensive emergency - resolved  -Monitor closely, currently on isordil hydralazine metoprolol and losartan -We'll assess serum creatinine given the patient is on losartan   Generalized Weakness -TSH is elevated; euthyroid sick syndrome? I will check t4.   Abdominal Pain Lower  -bladder scan over 500cc urine. Will order one time straight cath. If continues to retain then she may need Foley.  -check Abd XR as well.   Hyperlipidemia -Lipid panel -Continue statin and may need dose adjustment  Diabetes type 2 A1c 9.3 -Accu-Cheks, insulin sliding scale to sensitive  -diabetic coordinator consulted -Levemir 16 units  Chronic kidney disease stage III -Her creatinine appears to be at baseline of 1.3. In the past she has fluctuated from 1.1-1.4. -Avoid nephrotoxic drugs, monitor urine output. Monitor creatinine  Obstructive sleep apnea -CPAP as  needed  Increasing urinary frequency -UA negative for urinary tract infection which showed glucosuria.  DVT prophylaxis: Heparin Code Status: Full code Family Communication:   Disposition Plan: Tx to Telemetry.   Consultants:   Neuro  Procedures:   None  Antimicrobials:   None   Subjective: No new complaints reported today.  Objective: Vitals:   10/24/16 2359 10/25/16 0042 10/25/16 0444 10/25/16 0910  BP: 132/67 (!) 165/76 (!) 145/83 (!) 149/78  Pulse:  71 80 72  Resp:  20 20 18   Temp:  97.6 F (36.4 C) 98 F (36.7 C) 97.8 F (36.6 C)  TempSrc:  Oral Oral Oral  SpO2:  94% 97% 95%  Weight:      Height:        Intake/Output Summary (Last 24 hours) at 10/25/16 1530 Last data filed at 10/25/16 0600  Gross per 24 hour  Intake              280 ml  Output              216 ml  Net               64 ml   Filed Weights   10/23/16 0350  Weight: 106.6 kg (235 lb 0.2 oz)    Examination:  General exam: Appears calm and comfortable, in nad. Respiratory system: Clear to auscultation. Respiratory effort normal. Equal chest rise. Cardiovascular system: S1 & S2 heard, RRR. No JVD, murmurs, rubs, gallops or clicks. No pedal edema. Gastrointestinal system: Abdomen is nondistended, soft and nontender. No organomegaly or masses felt.  Normal bowel sounds heard. Central nervous system: Alert and oriented. Left sided facial droop. dryarthria Extremities: 1/5 strength in LUE and LLE.  Skin: No rashes, lesions or ulcers Psychiatry: Judgement and insight appear normal. Mood & affect appropriate.     Data Reviewed:   CBC:  Recent Labs Lab 10/21/16 0254 10/21/16 0259 10/21/16 0854 10/22/16 0333 10/24/16 0257  WBC 11.9*  --  11.4* 11.8* 10.4  NEUTROABS 9.1*  --   --   --   --   HGB 12.8 13.3 12.1 12.9 12.5  HCT 39.1 39.0 37.0 39.0 38.7  MCV 87.5  --  86.2 86.5 87.8  PLT 281  --  274 324 272   Basic Metabolic Panel:  Recent Labs Lab 10/21/16 0254 10/21/16 0259  10/21/16 0854 10/22/16 0333 10/24/16 0257  NA 135 137  --  135 138  K 4.6 4.7  --  3.9 4.1  CL 97* 100*  --  98* 103  CO2 25  --   --  22 24  GLUCOSE 254* 259*  --  274* 222*  BUN 22* 27*  --  23* 21*  CREATININE 1.41* 1.30* 1.33* 1.37* 1.30*  CALCIUM 9.2  --   --  9.4 9.0   GFR: Estimated Creatinine Clearance: 46.9 mL/min (by C-G formula based on SCr of 1.3 mg/dL (H)). Liver Function Tests:  Recent Labs Lab 10/21/16 0254 10/24/16 0257  AST 31 44*  ALT 27 34  ALKPHOS 49 42  BILITOT 0.5 1.2  PROT 7.9 6.8  ALBUMIN 4.4 3.8   No results for input(s): LIPASE, AMYLASE in the last 168 hours.  Recent Labs Lab 10/24/16 0257  AMMONIA 20   Coagulation Profile:  Recent Labs Lab 10/21/16 0254  INR 1.00   Cardiac Enzymes: No results for input(s): CKTOTAL, CKMB, CKMBINDEX, TROPONINI in the last 168 hours. BNP (last 3 results) No results for input(s): PROBNP in the last 8760 hours. HbA1C: No results for input(s): HGBA1C in the last 72 hours. CBG:  Recent Labs Lab 10/24/16 1140 10/24/16 1636 10/24/16 2154 10/25/16 0632 10/25/16 1139  GLUCAP 262* 216* 188* 293* 272*   Lipid Profile: No results for input(s): CHOL, HDL, LDLCALC, TRIG, CHOLHDL, LDLDIRECT in the last 72 hours. Thyroid Function Tests:  Recent Labs  10/24/16 0257 10/24/16 0919  TSH 10.143*  --   T4TOTAL  --  10.0  FREET4  --  1.36*   Anemia Panel: No results for input(s): VITAMINB12, FOLATE, FERRITIN, TIBC, IRON, RETICCTPCT in the last 72 hours. Sepsis Labs: No results for input(s): PROCALCITON, LATICACIDVEN in the last 168 hours.  Recent Results (from the past 240 hour(s))  MRSA PCR Screening     Status: None   Collection Time: 10/21/16 10:30 AM  Result Value Ref Range Status   MRSA by PCR NEGATIVE NEGATIVE Final    Comment:        The GeneXpert MRSA Assay (FDA approved for NASAL specimens only), is one component of a comprehensive MRSA colonization surveillance program. It is  not intended to diagnose MRSA infection nor to guide or monitor treatment for MRSA infections.      Radiology Studies: Dg Abd Portable 1v  Result Date: 10/24/2016 CLINICAL DATA:  Abdominal pain EXAM: PORTABLE ABDOMEN - 1 VIEW COMPARISON:  None. FINDINGS: Contrast material is noted throughout the large and small bowel consistent with the recent modified barium swallow. No obstructive changes are seen. No free air is noted. No findings of ascites are seen. Degenerative change of the thoracolumbar  spine is noted. IMPRESSION: No acute abnormality seen. Electronically Signed   By: Alcide Clever M.D.   On: 10/24/2016 19:07   Scheduled Meds: . aspirin EC  325 mg Oral Daily  . atorvastatin  40 mg Oral q1800  . clopidogrel  75 mg Oral Daily  . heparin  5,000 Units Subcutaneous Q8H  . hydrALAZINE  25 mg Oral Q6H  . insulin aspart  0-5 Units Subcutaneous QHS  . insulin aspart  0-9 Units Subcutaneous TID WC  . insulin detemir  16 Units Subcutaneous Daily  . isosorbide dinitrate  10 mg Oral TID  . lipase/protease/amylase  36,000 Units Oral TID AC  . losartan  100 mg Oral Daily  . metoprolol succinate  100 mg Oral BID   Continuous Infusions:    LOS: 3 days    Time spent: 35 mins   Penny Pia, MD [facility] Pager [phone]  If 7PM-7AM, please contact night-coverage [email or web address] Password [email or web address] 10/25/2016, 3:30 PM

## 2016-10-26 LAB — BASIC METABOLIC PANEL
ANION GAP: 13 (ref 5–15)
BUN: 27 mg/dL — ABNORMAL HIGH (ref 6–20)
CALCIUM: 9.1 mg/dL (ref 8.9–10.3)
CO2: 24 mmol/L (ref 22–32)
Chloride: 99 mmol/L — ABNORMAL LOW (ref 101–111)
Creatinine, Ser: 1.37 mg/dL — ABNORMAL HIGH (ref 0.44–1.00)
GFR, EST AFRICAN AMERICAN: 44 mL/min — AB (ref >60)
GFR, EST NON AFRICAN AMERICAN: 38 mL/min — AB (ref >60)
Glucose, Bld: 210 mg/dL — ABNORMAL HIGH (ref 65–99)
POTASSIUM: 3.9 mmol/L (ref 3.5–5.1)
Sodium: 136 mmol/L (ref 135–145)

## 2016-10-26 LAB — GLUCOSE, CAPILLARY
GLUCOSE-CAPILLARY: 246 mg/dL — AB (ref 65–99)
GLUCOSE-CAPILLARY: 301 mg/dL — AB (ref 65–99)
GLUCOSE-CAPILLARY: 229 mg/dL — AB (ref 65–99)
Glucose-Capillary: 244 mg/dL — ABNORMAL HIGH (ref 65–99)

## 2016-10-26 LAB — VITAMIN B12: Vitamin B-12: 254 pg/mL (ref 180–914)

## 2016-10-26 LAB — FOLATE: Folate: 30.4 ng/mL (ref >5.9)

## 2016-10-26 LAB — AMMONIA: Ammonia: 31 umol/L (ref 9–35)

## 2016-10-26 MED ORDER — PREMIER PROTEIN SHAKE
11.0000 [oz_av] | Freq: Two times a day (BID) | ORAL | Status: DC
  Administered 2016-10-26 – 2016-10-27 (×3): 11 [oz_av] via ORAL
  Filled 2016-10-26 (×5): qty 325.31

## 2016-10-26 NOTE — Progress Notes (Addendum)
Physical Therapy Treatment Patient Details Name: Jamie KaufmannLinda Durnil MRN: [id number] DOB: 06/07/1946 Today's Date: 10/26/2016    History of Present Illness Jamie KaufmannLinda Colan is a 71 y.o. female with a Past Medical History of significant for 2 prior strokes approximately 15 years ago, chronic kidney disease, hypertension, hyperlipidemia who presents with sudden onset left-sided weakness since yesterday afternoon around 3 PM.  Refusing MRI;  has a pertinent past medical history of Arthritis;Chronic bronchitis; Chronic kidney disease;  Hyperlipidemia; Hypertension; Pellucid marginal degeneration of cornea; Stroke (HCC) (09/2005, 07/2006); and Type 2 diabetes mellitus (HCC).    PT Comments    Pt performed co-treat with PT and OT to perform seated balance during ADLs edge of bed.  Emphasis placed on positioning and weight shifting to engage core strength.  Pt will continue to benefit from skilled placement at SNF to improve strength, functional mobility and coordination.    Follow Up Recommendations  SNF     Equipment Recommendations   (TBD SNF)    Recommendations for Other Services OT consult;Speech consult     Precautions / Restrictions Precautions Precautions: Fall Precaution Comments: dense L hemi Restrictions Weight Bearing Restrictions: No    Mobility  Bed Mobility Overal bed mobility: Needs Assistance Bed Mobility: Supine to Sit;Sit to Supine Rolling: Mod assist;Max assist (mod to L and max to right)   Supine to sit: Max assist;+2 for safety/equipment;+2 for physical assistance Sit to supine: Max assist;+2 for physical assistance   General bed mobility comments: cues for technique, assist for LEs and trunk elevation.  Pt with posterior and lateral lean sitting edge of bed.    Transfers Overall transfer level:  (not performed remains weak with poor trunk control.  )                  Ambulation/Gait                 Stairs            Wheelchair Mobility     Modified Rankin (Stroke Patients Only)       Balance Overall balance assessment: Needs assistance Sitting-balance support: Feet supported;Single extremity supported Sitting balance-Leahy Scale: Poor Sitting balance - Comments: R lateral lean Postural control: Posterior lean;Right lateral lean Standing balance support: Bilateral upper extremity supported;During functional activity Standing balance-Leahy Scale: Zero Standing balance comment: Pt performed propping on elbows to the R and L, forward weight shifting, lateral weight shifting and static sitting.                      Cognition Arousal/Alertness: Awake/alert Behavior During Therapy:  (at times labile, talk constantly) Overall Cognitive Status: Impaired/Different from baseline Area of Impairment: Attention;Following commands;Memory   Current Attention Level: Sustained Memory: Decreased short-term memory Following Commands: Follows one step commands with increased time     Problem Solving: Slow processing;Difficulty sequencing;Requires verbal cues;Requires tactile cues General Comments: Pt talking constantly about her desire to get better.    Exercises Low Level/ICU Exercises Stabilized Bridging: AROM;Both;5 reps;Supine Other Exercises Other Exercises: max assist to locate L UE to protect during bed mobility and to position on pillow    General Comments        Pertinent Vitals/Pain Pain Assessment: Faces Faces Pain Scale: Hurts a little bit Pain Location: R LE when touched Pain Descriptors / Indicators: Guarding Pain Intervention(s): Monitored during session    Home Living  Prior Function            PT Goals (current goals can now be found in the care plan section) Acute Rehab PT Goals Patient Stated Goal: to get as strong as I can Potential to Achieve Goals: Good Progress towards PT goals: Progressing toward goals    Frequency    Min 3X/week      PT Plan  Current plan remains appropriate    Co-evaluation PT/OT/SLP Co-Evaluation/Treatment: Yes Reason for Co-Treatment: For patient/therapist safety PT goals addressed during session: Mobility/safety with mobility;Balance OT goals addressed during session: Strengthening/ROM;ADL's and self-care     End of Session   Activity Tolerance: Patient tolerated treatment well Patient left: in bed;with call bell/phone within reach Nurse Communication: Mobility status PT Visit Diagnosis: Hemiplegia and hemiparesis Hemiplegia - Right/Left: Left Hemiplegia - dominant/non-dominant: Non-dominant Hemiplegia - caused by: Cerebral infarction     Time: 1610-9604 PT Time Calculation (min) (ACUTE ONLY): 45 min  Charges:  $Therapeutic Activity: 23-37 mins                    G Codes:       Florestine Avers 11/25/2016, 3:36 PM Joycelyn Rua, PTA pager [phone]

## 2016-10-26 NOTE — Progress Notes (Signed)
Initial Nutrition Assessment  DOCUMENTATION CODES:   Morbid obesity  INTERVENTION:   Premier Protein BID, each supplement provides 160kcal and 30g protein.   NUTRITION DIAGNOSIS:   Inadequate oral intake related to acute illness, poor appetite. CVA as evidenced by meal completion < 50%.  GOAL:   Patient will meet greater than or equal to 90% of their needs  MONITOR:   PO intake, Labs, Weight trends  REASON FOR ASSESSMENT:   Low Braden    ASSESSMENT:   71 year old female with past medical history of 2 CVAs, chronic kidney disease, hypertension, lipidemia came with complaints of sudden onset of left-sided weakness starting the prior to her admission. She stated she had been having intermittent stroke type of symptoms for past 2 or 3 weeks. She is on Plavix at home. At first she was refusing CT of the head but later showed suspicion for  infarction in left MCA region with some old lacunar infarcts.   Met with pt in room today. Pt reports good appetite and oral intake pta. Pt reports that she has not eaten well in 6 days. Pt currently eating 25% meals. Pt does not like supplements but is willing to try Premier Protein; RD will order. Per chart, pt has lost 14lbs(6%) in 3 months; this is not significant. Pt currently on DYS 3/thin diet. Continue to encourage supplements and po intake. Pt not appropriate for education today. Pt appears slightly confused and asking to use rest room while RD in room.      Medications reviewed and include: aspirin, plavix, heparin, insulin, creon  Labs reviewed: Cl 99(L), BUN 27(H), creat 1.37(H) cbgs- 222, 210 x 48hrs AIC -9.3(H) 3/11  Nutrition-Focused physical exam completed. Findings are no fat depletion, no muscle depletion, and no edema.   Diet Order:  DIET DYS 3 Room service appropriate? Yes; Fluid consistency: Thin  Skin:  Reviewed, no issues  Last BM:  3/14  Height:   Ht Readings from Last 1 Encounters:  10/21/16 _0  (1.575 m)     Weight:   Wt Readings from Last 1 Encounters:  10/26/16 235 lb (106.6 kg)    Ideal Body Weight:  50 kg  BMI:  Body mass index is 42.98 kg/m.  Estimated Nutritional Needs:   Kcal:  1800-2100kcal/day   Protein:  85-94g/day   Fluid:  >1.8L/day   EDUCATION NEEDS:   Education needs no appropriate at this time  Koleen Distance, RD, [employer] Pager #[phone]

## 2016-10-26 NOTE — Progress Notes (Signed)
PROGRESS NOTE    Jamie Aguirre  HKV:[id number] DOB: 1945-10-13 DOA: 10/21/2016 PCP: Myrlene Broker, MD   Brief Narrative:  71 year old female with past medical history of 2 CVAs, chronic kidney disease, hypertension, lipidemia came with complaints of sudden onset of left-sided weakness starting the prior to her admission. She stated she had been having intermittent stroke type of symptoms for past 2 or 3 weeks. She is on Plavix at home. At first she was refusing CT of the head but later showed suspicion for  infarction in left MCA region with some old lacunar infarcts.   Assessment & Plan:   Principal Problem:   Acute ischemic left MCA stroke (HCC) Active Problems:   Type 2 diabetes mellitus (HCC)   HTN (hypertension)   Hyperlipidemia   OSA (obstructive sleep apnea)   Hypertensive emergency   Stroke [facility])   Urinary frequency   Left hip pain  Subacute-chronic ischemic stroke in left MCA territory -CT of the head was done _ PT rec - SNF -S&S - rec Dysphagia 3 diet -Supportive care -cont asa, plavix, statin.   AMS - Will work up. Could be valium but patient still confused. Will plan for further work up with folate, vitamin b 12, rpr, ammonia levels.  - reassess next am. - d/c valium  Hypertensive emergency - resolved  -Monitor closely, currently on isordil hydralazine metoprolol and losartan -We'll assess serum creatinine given the patient is on losartan   Generalized Weakness -TSH is elevated; euthyroid sick syndrome? I will check t4.   Abdominal Pain Lower  -bladder scan over 500cc urine. Will order one time straight cath. If continues to retain then she may need Foley.  -check Abd XR as well.   Hyperlipidemia -Lipid panel -Continue statin and may need dose adjustment  Diabetes type 2 A1c 9.3 -Accu-Cheks, insulin sliding scale to sensitive  -diabetic coordinator consulted -Levemir 16 units  Chronic kidney disease stage III -Her creatinine appears to be  at baseline of 1.3. In the past she has fluctuated from 1.1-1.4. -Avoid nephrotoxic drugs, monitor urine output. Monitor creatinine  Obstructive sleep apnea -CPAP as needed  Increasing urinary frequency -UA negative for urinary tract infection which showed glucosuria.  DVT prophylaxis: Heparin Code Status: Full code Family Communication:   Disposition Plan: Tx to Telemetry.   Consultants:   Neuro  Procedures:   None  Antimicrobials:   None   Subjective: No new complaints reported today. Patient confused asking about things that did not happen.  Objective: Vitals:   10/26/16 0926 10/26/16 0952 10/26/16 1230 10/26/16 1448  BP:  (!) 188/85 (!) 152/81 (!) 156/78  Pulse:  70  82  Resp:  18  18  Temp:  98 F (36.7 C)  98.3 F (36.8 C)  TempSrc:  Oral  Oral  SpO2:  97%  96%  Weight: 106.6 kg (235 lb)     Height:       No intake or output data in the 24 hours ending 10/26/16 1525 Filed Weights   10/26/16 0926  Weight: 106.6 kg (235 lb)    Examination:  General exam: Appears calm and comfortable, in nad. confused Respiratory system: Clear to auscultation. Respiratory effort normal. Equal chest rise. Cardiovascular system: S1 & S2 heard, RRR. No JVD, murmurs, rubs, gallops or clicks. No pedal edema. Gastrointestinal system: Abdomen is nondistended, soft and nontender. No organomegaly or masses felt. Normal bowel sounds heard. Central nervous system: Alert and oriented. Left sided facial droop. dryarthria Extremities: 1/5 strength in  LUE and LLE.  Skin: No rashes, lesions or ulcers Psychiatry: Judgement and insight appear normal. Mood & affect appropriate.     Data Reviewed:   CBC:  Recent Labs Lab 10/21/16 0254 10/21/16 0259 10/21/16 0854 10/22/16 0333 10/24/16 0257  WBC 11.9*  --  11.4* 11.8* 10.4  NEUTROABS 9.1*  --   --   --   --   HGB 12.8 13.3 12.1 12.9 12.5  HCT 39.1 39.0 37.0 39.0 38.7  MCV 87.5  --  86.2 86.5 87.8  PLT 281  --  274 324 272     Basic Metabolic Panel:  Recent Labs Lab 10/21/16 0254 10/21/16 0259 10/21/16 0854 10/22/16 0333 10/24/16 0257 10/26/16 0306  NA 135 137  --  135 138 136  K 4.6 4.7  --  3.9 4.1 3.9  CL 97* 100*  --  98* 103 99*  CO2 25  --   --  22 24 24   GLUCOSE 254* 259*  --  274* 222* 210*  BUN 22* 27*  --  23* 21* 27*  CREATININE 1.41* 1.30* 1.33* 1.37* 1.30* 1.37*  CALCIUM 9.2  --   --  9.4 9.0 9.1   GFR: Estimated Creatinine Clearance: 44.5 mL/min (A) (by C-G formula based on SCr of 1.37 mg/dL (H)). Liver Function Tests:  Recent Labs Lab 10/21/16 0254 10/24/16 0257  AST 31 44*  ALT 27 34  ALKPHOS 49 42  BILITOT 0.5 1.2  PROT 7.9 6.8  ALBUMIN 4.4 3.8   No results for input(s): LIPASE, AMYLASE in the last 168 hours.  Recent Labs Lab 10/24/16 0257 10/26/16 1022  AMMONIA 20 31   Coagulation Profile:  Recent Labs Lab 10/21/16 0254  INR 1.00   Cardiac Enzymes: No results for input(s): CKTOTAL, CKMB, CKMBINDEX, TROPONINI in the last 168 hours. BNP (last 3 results) No results for input(s): PROBNP in the last 8760 hours. HbA1C: No results for input(s): HGBA1C in the last 72 hours. CBG:  Recent Labs Lab 10/25/16 1139 10/25/16 1632 10/25/16 2200 10/26/16 0639 10/26/16 1149  GLUCAP 272* 291* 258* 246* 244*   Lipid Profile: No results for input(s): CHOL, HDL, LDLCALC, TRIG, CHOLHDL, LDLDIRECT in the last 72 hours. Thyroid Function Tests:  Recent Labs  10/24/16 0257 10/24/16 0919  TSH 10.143*  --   T4TOTAL  --  10.0  FREET4  --  1.36*   Anemia Panel:  Recent Labs  10/26/16 1022  VITAMINB12 254  FOLATE 30.4   Sepsis Labs: No results for input(s): PROCALCITON, LATICACIDVEN in the last 168 hours.  Recent Results (from the past 240 hour(s))  MRSA PCR Screening     Status: None   Collection Time: 10/21/16 10:30 AM  Result Value Ref Range Status   MRSA by PCR NEGATIVE NEGATIVE Final    Comment:        The GeneXpert MRSA Assay (FDA approved for  NASAL specimens only), is one component of a comprehensive MRSA colonization surveillance program. It is not intended to diagnose MRSA infection nor to guide or monitor treatment for MRSA infections.      Radiology Studies: Dg Abd Portable 1v  Result Date: 10/24/2016 CLINICAL DATA:  Abdominal pain EXAM: PORTABLE ABDOMEN - 1 VIEW COMPARISON:  None. FINDINGS: Contrast material is noted throughout the large and small bowel consistent with the recent modified barium swallow. No obstructive changes are seen. No free air is noted. No findings of ascites are seen. Degenerative change of the thoracolumbar spine is noted. IMPRESSION: No  acute abnormality seen. Electronically Signed   By: Alcide CleverMark  Lukens M.D.   On: 10/24/2016 19:07   Scheduled Meds: . aspirin EC  325 mg Oral Daily  . atorvastatin  40 mg Oral q1800  . clopidogrel  75 mg Oral Daily  . heparin  5,000 Units Subcutaneous Q8H  . hydrALAZINE  25 mg Oral Q6H  . insulin aspart  0-5 Units Subcutaneous QHS  . insulin aspart  0-9 Units Subcutaneous TID WC  . insulin detemir  16 Units Subcutaneous Daily  . isosorbide dinitrate  10 mg Oral TID  . lipase/protease/amylase  36,000 Units Oral TID AC  . losartan  100 mg Oral Daily  . metoprolol succinate  100 mg Oral BID  . protein supplement shake  11 oz Oral BID BM   Continuous Infusions:    LOS: 4 days    Time spent: 35 mins   Penny PiaVEGA, Jaking Thayer, MD [facility] Pager [phone]  If 7PM-7AM, please contact night-coverage [email or web address] Password [email or web address] 10/26/2016, 3:25 PM

## 2016-10-26 NOTE — Progress Notes (Signed)
Occupational Therapy Treatment Patient Details Name: Jamie KaufmannLinda Aguirre MRN: [id number] DOB: 27-Jan-1946 Today's Date: 10/26/2016    History of present illness Jamie Aguirre is a 71 y.o. female with a Past Medical History of significant for 2 prior strokes approximately 15 years ago, chronic kidney disease, hypertension, hyperlipidemia who presents with sudden onset left-sided weakness since yesterday afternoon around 3 PM.  Refusing MRI;  has a pertinent past medical history of Arthritis;Chronic bronchitis; Chronic kidney disease;  Hyperlipidemia; Hypertension; Pellucid marginal degeneration of cornea; Stroke (HCC) (09/2005, 07/2006); and Type 2 diabetes mellitus (HCC).   OT comments  Pt cooperative and eager to work with therapy. Focus of session on bed mobility for use of bed pan and pericare and trunk control at EOB with lateral and anterior weight shifting. Pt requiring max assist initially for sitting balance, increased to moderate and then minimal assistance, briefly. Pt continues to demonstrate decreased attention, requiring verbal cues and repetition of verbal directions to follow through. Mild increased tone in L UE with decreased sensation remains. Will continue to follow.   Follow Up Recommendations  SNF;Supervision/Assistance - 24 hour    Equipment Recommendations       Recommendations for Other Services      Precautions / Restrictions Precautions Precautions: Fall Precaution Comments: dense L hemi Restrictions Weight Bearing Restrictions: No       Mobility Bed Mobility Overal bed mobility: Needs Assistance Bed Mobility: Rolling;Supine to Sit;Sit to Supine Rolling: Mod assist;Max assist (mod to L, max to R)   Supine to sit: Max assist;+2 for safety/equipment Sit to supine: Max assist;+2 for physical assistance   General bed mobility comments: cues for technique, assist for LEs and trunk  Transfers                      Balance Overall balance assessment: Needs  assistance Sitting-balance support: Feet supported;Single extremity supported Sitting balance-Leahy Scale: Poor   Postural control: Posterior lean;Right lateral lean                         ADL Overall ADL's : Needs assistance/impaired     Grooming: Brushing hair;Sitting;Maximal assistance Grooming Details (indicate cue type and reason): only combs R side of head         Upper Body Dressing : Maximal assistance;Bed level           Toileting- Clothing Manipulation and Hygiene: Total assistance;Bed level         General ADL Comments: Pt with urinary incontinence, assist to clean up and change gown and bed linen. Pt able to state when needing to use bed pan.      Vision                 Additional Comments: appears intact, pt wears glasses   Perception     Praxis      Cognition   Behavior During Therapy:  (at times labile, talks constantly) Overall Cognitive Status: Impaired/Different from baseline Area of Impairment: Attention;Following commands;Memory   Current Attention Level: Sustained Memory: Decreased short-term memory  Following Commands: Follows one step commands with increased time     Problem Solving: Slow processing;Difficulty sequencing;Requires verbal cues;Requires tactile cues General Comments: Pt talking constantly about her desire to get better.      Exercises Other Exercises Other Exercises: max assist to locate L UE to protect during bed mobility and to position on pillow   Shoulder Instructions  General Comments      Pertinent Vitals/ Pain       Pain Assessment: Faces Faces Pain Scale: Hurts a little bit Pain Location: R LE when touched Pain Descriptors / Indicators: Guarding Pain Intervention(s): Monitored during session  Home Living                                          Prior Functioning/Environment              Frequency  Min 2X/week        Progress Toward Goals  OT  Goals(current goals can now be found in the care plan section)  Progress towards OT goals: Progressing toward goals  Acute Rehab OT Goals Patient Stated Goal: to get as strong as I can Time For Goal Achievement: 11/05/16 Potential to Achieve Goals: Good  Plan Discharge plan remains appropriate    Co-evaluation    PT/OT/SLP Co-Evaluation/Treatment: Yes Reason for Co-Treatment: For patient/therapist safety   OT goals addressed during session: Strengthening/ROM      End of Session    OT Visit Diagnosis: Muscle weakness (generalized) (M62.81);Low vision, both eyes (H54.2);Hemiplegia and hemiparesis;Unsteadiness on feet (R26.81);Pain;Other symptoms and signs involving cognitive function Hemiplegia - Right/Left: Left Hemiplegia - dominant/non-dominant: Non-Dominant Hemiplegia - caused by: Unspecified Pain - Right/Left: Right Pain - part of body: Leg   Activity Tolerance Patient tolerated treatment well   Patient Left in bed;with call bell/phone within reach   Nurse Communication          Time: 1610-9604 OT Time Calculation (min): 45 min  Charges: OT General Charges $OT Visit: 1 Procedure OT Treatments $Therapeutic Activity: 8-22 mins   Evern Bio 10/26/2016, 3:18 PM  [phone]

## 2016-10-27 LAB — GLUCOSE, CAPILLARY
GLUCOSE-CAPILLARY: 195 mg/dL — AB (ref 65–99)
GLUCOSE-CAPILLARY: 279 mg/dL — AB (ref 65–99)

## 2016-10-27 LAB — RPR: RPR: NONREACTIVE

## 2016-10-27 MED ORDER — WHITE PETROLATUM GEL
Status: AC
  Administered 2016-10-27: 07:00:00
  Filled 2016-10-27: qty 1

## 2016-10-27 MED ORDER — ATORVASTATIN CALCIUM 40 MG PO TABS: 40.0000 mg | ORAL_TABLET | Freq: Every day | ORAL | 0 refills | Status: DC

## 2016-10-27 MED ORDER — ASPIRIN 325 MG PO TBEC: 325.0000 mg | DELAYED_RELEASE_TABLET | Freq: Every day | ORAL | 0 refills | Status: AC

## 2016-10-27 NOTE — Progress Notes (Signed)
Pt [facility] at [facility] all [facility] belongings. IV discontinued, dry dressing applied. Discharge instructions provided along with prescription provided with verbal understanding. No noted distress. Report called in to nurse, Sue LushAndrea at facility.

## 2016-10-27 NOTE — Clinical Social Work Placement (Addendum)
   CLINICAL SOCIAL WORK PLACEMENT  NOTE  Date:  10/27/2016  Patient Details  Name: Maryelizabeth KaufmannLinda Shafran MRN: [id number] Date of Birth: 1946-01-06  Clinical Social Work is seeking post-discharge placement for this patient at the Skilled  Nursing Facility level of care (*CSW will initial, date and re-position this form in  chart as items are completed):  Yes   Patient/family provided with [place] Clinical Social Work Department's list of facilities offering this level of care within the geographic area requested by the patient (or if unable, by the patient's family).  Yes   Patient/family informed of their freedom to choose among providers that offer the needed level of care, that participate in Medicare, Medicaid or managed care program needed by the patient, have an available bed and are willing to accept the patient.  Yes   Patient/family informed of [place]'s ownership interest in [facility] and [facility], as well as of the fact that they are under no obligation to receive care at these facilities.  PASRR submitted to EDS on 10/24/16     PASRR number received on 10/24/16     Existing PASRR number confirmed on       FL2 transmitted to all facilities in geographic area requested by pt/family on 10/24/16     FL2 transmitted to all facilities within larger geographic area on       Patient informed that his/her managed care company has contracts with or will negotiate with certain facilities, including the following:        Yes   Patient/family informed of bed offers received.  [facility]     Physician recommends and patient chooses bed at      Patient to be [facility] on 10/27/16.  Patient to be transferred to facility by [facility]     Patient family notified on 10/27/16 of transfer.  Name of family member notified:  Crist Fatravis-son     PHYSICIAN       Additional Comment:  Pt ready for d/[facility] today [facility]. Pt and friend/POA-[name] agreeable to d/c plan. CSW communicated with Olegario MessierKathy (admissions) at [facility] for bed confirmation and room and report. CSW provided room and report to RN and put in treatment team sticky note. CSW arranged transportation with [facility]. CSW notified pt, RN, POA, and facility of d/c. Pt asked for chaplain to come to room. RN to notify Chaplain of request. CSW signing off as no other SW needs identified.  _______________________________________________ Dominic PeaJeneya G Tila Millirons, [name] 10/27/2016, 2:21 PM

## 2016-10-27 NOTE — Progress Notes (Signed)
  Speech Language Pathology Treatment: Dysphagia  Patient Details Name: Jamie Aguirre MRN: [id number] DOB: [birth year] Today's Date: 10/27/2016 Time: [id number] SLP Time Calculation (min) (ACUTE ONLY): 20 min  Assessment / Plan / Recommendation Clinical Impression  Pt tolerating current diet with good recall of precautions when queried, but intermittent execution.  Cues needed to decrease impulsivity/rate and attend to left buccal cavity.  Pt instructed in use of chin-tuck-against-resistance exercise for improving submental strength.  Carried out independently -written instructions provided.  Pt intermittently crying; provided support/encouragement.   HPI HPI: Jamie Aguirre a 71 y.o.femalewith a Past Medical History of significant for 2 prior strokes approximately 15 years ago, chronic kidney disease, hypertension, hyperlipidemia who presents with sudden onset left-sided weakness since yesterday afternoon around 3 PM. Also has had intermittent stroke like symptoms intermittently for 2-3 weeks associated with slurred speech and facial droop. 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 10/21/16 Suspicious for acute infarction in the left MCA territory, nonhemorrhagic. No mass effect or midline shift. Moderate atrophy and chronic microvascular disease, with remote lacunar infarctions and remote right pons infarction. No prior swallowing evaluations in chart. SLP consult ordered for swallowing and cognitive linguistic evaluation.       SLP Plan  Continue with current plan of care       Recommendations  Diet recommendations: Dysphagia 2 (fine chop);Thin liquid Liquids provided via: Cup;Straw Medication Administration: Whole meds with puree Supervision: Staff to assist with self feeding;Full supervision/cueing for compensatory strategies Compensations: Slow rate;Small sips/bites;Lingual sweep for clearance of pocketing;Multiple dry swallows after each  bite/sip Postural Changes and/or Swallow Maneuvers: Seated upright 90 degrees                Oral Care Recommendations: Oral care BID Follow up Recommendations: Skilled Nursing facility SLP Visit Diagnosis: Dysphagia, unspecified (R13.10) Plan: Continue with current plan of care       GO                Jamie Aguirre 10/27/2016, 11:47 AM

## 2016-10-27 NOTE — Care Management Important Message (Signed)
Important Message  Patient Details  Name: Jamie KaufmannLinda Aguirre MRN: [id number] Date of Birth: 11-20-1945   Medicare Important Message Given:  Yes    [name] 10/27/2016, 1:03 PM

## 2016-10-27 NOTE — Discharge Summary (Signed)
Physician Discharge Summary  Jamie Aguirre ZOX:[id number] DOB: [birth year] DOA: 10/21/2016  PCP: Myrlene Broker, MD  Admit date: 10/21/2016 Discharge date: 10/27/2016  Time spent: > 35 minutes  Recommendations for Outpatient Follow-up:  1. Monitor blood pressures 2. Ensure patient continue physical therapy 3. Pt had AMS secondary to polypharmacy. Discontinue antihistamines, muscle relaxants, and benzodiazepine   Discharge Diagnoses:  Principal Problem:   Acute ischemic left MCA stroke (HCC) Active Problems:   Type 2 diabetes mellitus (HCC)   HTN (hypertension)   Hyperlipidemia   OSA (obstructive sleep apnea)   Hypertensive emergency   Stroke [facility])   Urinary frequency   Left hip pain   Discharge Condition: stable  Diet recommendation: dysphagia 3 diet  Filed Weights   10/26/16 0926  Weight: 106.6 kg (235 lb)    History of present illness:  71 year old female with past medical history of 2 CVAs, chronic kidney disease, hypertension, lipidemia came with complaints of sudden onset of left-sided weakness starting the prior to her admission. She stated she had been having intermittent stroke type of symptoms for past 2 or 3 weeks. She is on Plavix at home. At first she was refusing CT of the head but later showed suspicion for  infarction in left MCA region with some old lacunar infarcts.   Hospital stay complicated by AMS secondary to medication (benzodiazepine suspected)  Hospital Course:  Subacute-chronic ischemic stroke in left MCA territory -Neurology consulted and followed While patient in-house _ PT rec - SNF -S&S - rec Dysphagia 3 diet -cont asa, plavix, statin.   AMS - resolved. Work up negative as such suspected was 2ary to benzodiazepine. - d/c valium  Hypertensive emergency - resolved  -continue current medication regimen. Recommend continued monitoring.  Abdominal Pain Lower  -resolved  Hyperlipidemia -Continue statin please see  below  Diabetes type 2 A1c 9.3 - Accu-Cheks, insulin sliding scale to sensitive  - diabetic coordinator consulted - Levemir 16 units  Chronic kidney disease stage III -Her creatinine appears to be at baseline of 1.3. In the past she has fluctuated from 1.1-1.4. -Avoid nephrotoxic drugs, monitor urine output. Monitor creatinine  Obstructive sleep apnea -CPAP as needed  Increasing urinary frequency -UA negative for urinary tract infection which showed glucosuria.  Procedures:  None  Consultations:  Neurology  Discharge Exam: Vitals:   10/27/16 0831 10/27/16 1200  BP: (!) 147/60 135/76  Pulse: 66 69  Resp: 18 18  Temp: 98.4 F (36.9 C) 98.2 F (36.8 C)    General: Pt in nad, alert and awake Cardiovascular: rrr, no rubs Respiratory: no increased wob, no wheezes  Discharge Instructions   Discharge Instructions    Ambulatory referral to Neurology    Complete by:  As directed    Follow up with Dr. Allena Katz at [facility] in 6 weeks. Thanks.   Call MD for:  difficulty breathing, headache or visual disturbances    Complete by:  As directed    Call MD for:  severe uncontrolled pain    Complete by:  As directed    Call MD for:  temperature >100.4    Complete by:  As directed    Diet - low sodium heart healthy    Complete by:  As directed    Increase activity slowly    Complete by:  As directed      Current Discharge Medication List    START taking these medications   Details  aspirin EC 325 MG EC tablet Take 1 tablet (325 mg total)  by mouth daily. Qty: 30 tablet, Refills: 0      CONTINUE these medications which have CHANGED   Details  atorvastatin (LIPITOR) 40 MG tablet Take 1 tablet (40 mg total) by mouth daily at 6 PM. Qty: 30 tablet, Refills: 0      CONTINUE these medications which have NOT CHANGED   Details  clopidogrel (PLAVIX) 75 MG tablet TAKE 1 TABLET BY MOUTH ONCE DAILY Qty: 90 tablet, Refills: 4    JENTADUETO 2.12-998 MG TABS TAKE 1 TABLET BY  MOUTH TWICE DAILY Qty: 180 tablet, Refills: 3    silver sulfADIAZINE (SILVADENE) 1 % cream Apply 1 application topically as needed (for infection).     triamcinolone cream (KENALOG) 0.1 % Apply 1 application topically 2 (two) times daily as needed. Qty: 80 g, Refills: 0    albuterol (VENTOLIN HFA) 108 (90 BASE) MCG/ACT inhaler Inhale 2 puffs into the lungs every 6 (six) hours as needed. Qty: 18 g, Refills: 5    lipase/protease/amylase (CREON) 36000 UNITS CPEP capsule Take 1 capsule (36,000 Units total) by mouth 3 (three) times daily before meals. Qty: 180 capsule, Refills: 6    losartan (COZAAR) 100 MG tablet TAKE 1 TABLET BY MOUTH EVERY DAY Qty: 90 tablet, Refills: 1    metoprolol succinate (TOPROL-XL) 100 MG 24 hr tablet TAKE 1 TABLET BY MOUTH TWICE A DAY Qty: 180 tablet, Refills: 1      STOP taking these medications     aspirin 81 MG tablet      benzonatate (TESSALON) 200 MG capsule      diphenhydrAMINE (BENADRYL) 25 MG tablet      loratadine (CLARITIN) 10 MG tablet      baclofen (LIORESAL) 10 MG tablet      cyclobenzaprine (FLEXERIL) 5 MG tablet      diazepam (VALIUM) 5 MG tablet      gabapentin (NEURONTIN) 100 MG capsule        Allergies  Allergen Reactions  . Codeine Shortness Of Breath and Other (See Comments)    Flu-like symptoms (also)  . Penicillins Anaphylaxis    Has patient had a PCN reaction causing immediate rash, facial/tongue/throat swelling, SOB or lightheadedness with hypotension: Yes Has patient had a PCN reaction causing severe rash involving mucus membranes or skin necrosis: Unk Has patient had a PCN reaction that required hospitalization: Yes (but was already in the hospital) Has patient had a PCN reaction occurring within the last 10 years: No If all of the above answers are "NO", then may proceed with Cephalosporin use.   . Ciprofloxacin Other (See Comments)    Results in IMMEDIATE VERTIGO  . Lactose Intolerance (Gi) Diarrhea  . Latex  Dermatitis    Also causes skin to become red & bumpy  . Spinach Diarrhea    Nor kale, nor lettuce, no green foods, nor spicy foods (EXPLOSIVE DIARRHEA)  . Sulfa Antibiotics Other (See Comments)    Reaction unknown to patient (allergy from childhood)  . Tape Rash    Causes skin to become red & bumpy    Contact information for follow-up providers    PATEL, DONIKA, DO. Schedule an appointment as soon as possible for a visit in 6 week(s).   Specialty:  Neurology Contact information: [address] [place] [address] [phone]            Contact information for after-discharge care    Destination    [facility] .   Specialty:  Skilled Nursing  Facility Contact information: [address] [place] [id number] [phone]                   The results of significant diagnostics from this hospitalization (including imaging, microbiology, ancillary and laboratory) are listed below for reference.    Significant Diagnostic Studies: Ct Head Wo Contrast  Result Date: 10/23/2016 CLINICAL DATA:  Initial evaluation for left arm and leg weakness. EXAM: CT HEAD WITHOUT CONTRAST TECHNIQUE: Contiguous axial images were obtained from the base of the skull through the vertex without intravenous contrast. COMPARISON:  Prior CT from 10/21/2016. FINDINGS: Brain: Now evident is an evolving acute/early subacute ischemic infarcts involving the posterior right lentiform nucleus extending towards the right lateral ventricle (series 201, image 20). This is increased in prominence relative to prior exam. No significant mass effect. No associated hemorrhage. No other acute or evolving infarct identified. Gray-white matter differentiation otherwise maintained. No acute intracranial hemorrhage. Underlying cerebral atrophy with mild chronic small vessel ischemic disease. Additional remote lacunar infarcts noted within the pons and right basal ganglia. No  mass lesion, midline shift or mass effect. No hydrocephalus. No extra-axial fluid collection. Vascular: No hyperdense vessel. Extensive intracranial atherosclerosis noted. Skull: Scalp soft tissues demonstrate no acute abnormality. Calvarium intact. Sinuses/Orbits: Globes and orbital soft tissues within normal limits. Patient status post lens extraction bilaterally. Paranasal sinuses and mastoids are clear. Other: None. IMPRESSION: 1. Evolving acute/early subacute ischemic nonhemorrhagic lacunar type infarct involving the right basal ganglia. 2. Otherwise stable appearance of the brain with underlying atrophy and chronic microvascular ischemic disease. Electronically Signed   By: Rise MuBenjamin  McClintock M.D.   On: 10/23/2016 05:48   Ct Head Wo Contrast  Addendum Date: 10/21/2016   ADDENDUM REPORT: 10/21/2016 08:39 ADDENDUM: I was asked by the admitting team to review this case at 8:18 am on 10/21/2016. No changes of acute cortically based infarct are identified. There is widespread patchy cerebral white matter hypodensity. There is a chronic appearing lacunar infarct in the right paracentral pons. Dr. Clovis RileyMitchell was suspicious of hyperdense left MCA. There is extensive Calcified atherosclerosis at the skull base. To me the MCA M1 segments appear symmetric. CONCLUSION: No acute cortically based infarct identified by CT. Changes of advanced chronic small vessel ischemia. This was discussed by telephone with Dr. Dierdre Searlesawn Langeland On 10/21/2016 at 0838 hours. Electronically Signed   By: Odessa FlemingH  Hall M.D.   On: 10/21/2016 08:39   Result Date: 10/21/2016 CLINICAL DATA:  Left upper extremity weakness, onset yesterday afternoon EXAM: CT HEAD WITHOUT CONTRAST TECHNIQUE: Contiguous axial images were obtained from the base of the skull through the vertex without intravenous contrast. COMPARISON:  08/28/2007 FINDINGS: Brain: There is no intracranial hemorrhage, mass or mass effect. Probable remote lacunar infarction of the right caudate  and right putamen. Remote right pons infarction. There is mild generalized atrophy. There is moderate chronic microvascular ischemic change. There is no significant extra-axial fluid collection. Vascular: Hyperdense left MCA. Skull: Normal. Negative for fracture or focal lesion. Sinuses/Orbits: No acute finding. IMPRESSION: Suspicious for acute infarction in the left MCA territory, nonhemorrhagic. No mass effect or midline shift. Moderate atrophy and chronic microvascular disease, with remote lacunar infarctions and remote right pons infarction. These results will be called to the ordering clinician or representative by the Radiologist Assistant, and communication documented in the PACS or zVision Dashboard. Electronically Signed: By: Ellery Plunkaniel R Mitchell M.D. On: 10/21/2016 06:35   Dg Abd Portable 1v  Result Date: 10/24/2016 CLINICAL DATA:  Abdominal pain EXAM: PORTABLE  ABDOMEN - 1 VIEW COMPARISON:  None. FINDINGS: Contrast material is noted throughout the large and small bowel consistent with the recent modified barium swallow. No obstructive changes are seen. No free air is noted. No findings of ascites are seen. Degenerative change of the thoracolumbar spine is noted. IMPRESSION: No acute abnormality seen. Electronically Signed   By: Alcide Clever M.D.   On: 10/24/2016 19:07   Dg Hips Bilat With Pelvis 3-4 Views  Result Date: 10/21/2016 CLINICA DATA: Hip pain after being moved in bed. EXAM: DG HIP (WITH OR WITHOUT PELVIS) 3-4V BILAT COMPARISON:  None. FINDINGS: No fracture or dislocation. Bilateral hip joint spaces are preserved. No evidence avascular necrosis. Moderate colonic stool burden. Several phleboliths overlie the lower pelvis. Regional soft tissues appear otherwise normal. IMPRESSION: Unremarkable pelvic and bilateral hip radiographs. Electronically Signed   By: Simonne Come M.D.   On: 10/21/2016 10:30    Microbiology: Recent Results (from the past 240 hour(s))  MRSA PCR Screening     Status:  None   Collection Time: 10/21/16 10:30 AM  Result Value Ref Range Status   MRSA by PCR NEGATIVE NEGATIVE Final    Comment:        The GeneXpert MRSA Assay (FDA approved for NASAL specimens only), is one component of a comprehensive MRSA colonization surveillance program. It is not intended to diagnose MRSA infection nor to guide or monitor treatment for MRSA infections.      Labs: Basic Metabolic Panel:  Recent Labs Lab 10/21/16 0254 10/21/16 0259 10/21/16 0854 10/22/16 0333 10/24/16 0257 10/26/16 0306  NA 135 137  --  135 138 136  K 4.6 4.7  --  3.9 4.1 3.9  CL 97* 100*  --  98* 103 99*  CO2 25  --   --  22 24 24   GLUCOSE 254* 259*  --  274* 222* 210*  BUN 22* 27*  --  23* 21* 27*  CREATININE 1.41* 1.30* 1.33* 1.37* 1.30* 1.37*  CALCIUM 9.2  --   --  9.4 9.0 9.1   Liver Function Tests:  Recent Labs Lab 10/21/16 0254 10/24/16 0257  AST 31 44*  ALT 27 34  ALKPHOS 49 42  BILITOT 0.5 1.2  PROT 7.9 6.8  ALBUMIN 4.4 3.8   No results for input(s): LIPASE, AMYLASE in the last 168 hours.  Recent Labs Lab 10/24/16 0257 10/26/16 1022  AMMONIA 20 31   CBC:  Recent Labs Lab 10/21/16 0254 10/21/16 0259 10/21/16 0854 10/22/16 0333 10/24/16 0257  WBC 11.9*  --  11.4* 11.8* 10.4  NEUTROABS 9.1*  --   --   --   --   HGB 12.8 13.3 12.1 12.9 12.5  HCT 39.1 39.0 37.0 39.0 38.7  MCV 87.5  --  86.2 86.5 87.8  PLT 281  --  274 324 272   Cardiac Enzymes: No results for input(s): CKTOTAL, CKMB, CKMBINDEX, TROPONINI in the last 168 hours. BNP: BNP (last 3 results) No results for input(s): BNP in the last 8760 hours.  ProBNP (last 3 results) No results for input(s): PROBNP in the last 8760 hours.  CBG:  Recent Labs Lab 10/26/16 1149 10/26/16 1645 10/26/16 2124 10/27/16 0613 10/27/16 1135  GLUCAP 244* 301* 229* 195* 279*   Signed:  Penny Pia MD.  [facility] 10/27/2016, 1:53 PM

## 2016-10-27 NOTE — Care Management (Signed)
Pt [facility]. No further needs per CM.

## 2016-10-27 NOTE — Progress Notes (Addendum)
Inpatient Diabetes Program Recommendations  AACE/ADA: New Consensus Statement on Inpatient Glycemic Control (2015)  Target Ranges:  Prepandial:   less than 140 mg/dL      Peak postprandial:   less than 180 mg/dL (1-2 hours)      Critically ill patients:  140 - 180 mg/dL   Lab Results  Component Value Date   GLUCAP 195 (H) 10/27/2016   HGBA1C 9.3 (H) 10/22/2016    Review of Glycemic Control  Diabetes history: DM2, Obesity Outpatient Diabetes medications: Jentadueto 2.12/998 mg BID  Current Insulin Orders: Levemir 16 units daily, Novolog Sensitive Correction Scale/ SSI (0-9 units) TID AC + HS  Inpatient Diabetes Program Recommendations:   Insulin - Basal: Please consider increasing to Levemir 20 units daily  Insulin - Meal Coverage: Please consider Novolog 4 units TID with meals (hold if pt eats <50% of meal)   Thank you,  Kristine LineaKaren Jazmeen Axtell, RN, MSN Diabetes Coordinator Inpatient Diabetes Program [phone] (Team Pager)

## 2016-12-15 ENCOUNTER — Ambulatory Visit: Payer: Medicare Other | Admitting: Neurology

## 2017-01-01 ENCOUNTER — Ambulatory Visit: Payer: Medicare Other | Admitting: Internal Medicine

## 2017-01-02 ENCOUNTER — Ambulatory Visit: Payer: Medicare Other | Admitting: Neurology

## 2017-01-02 DIAGNOSIS — Z029 Encounter for administrative examinations, unspecified: Secondary | ICD-10-CM

## 2017-01-03 ENCOUNTER — Encounter: Payer: Self-pay | Admitting: Neurology

## 2017-01-11 ENCOUNTER — Telehealth: Payer: Self-pay | Admitting: Internal Medicine

## 2017-01-11 NOTE — Telephone Encounter (Signed)
Marcelino DusterMichelle from [facility] called requesting an extension of the pts OT for two weeks (two times a week for two weeks as of 01/28/17). Please advise.

## 2017-01-12 DIAGNOSIS — Z7982 Long term (current) use of aspirin: Secondary | ICD-10-CM | POA: Diagnosis not present

## 2017-01-12 DIAGNOSIS — I69354 Hemiplegia and hemiparesis following cerebral infarction affecting left non-dominant side: Secondary | ICD-10-CM | POA: Diagnosis not present

## 2017-01-12 DIAGNOSIS — Z7902 Long term (current) use of antithrombotics/antiplatelets: Secondary | ICD-10-CM | POA: Diagnosis not present

## 2017-01-12 DIAGNOSIS — N183 Chronic kidney disease, stage 3 (moderate): Secondary | ICD-10-CM | POA: Diagnosis not present

## 2017-01-12 DIAGNOSIS — Z9181 History of falling: Secondary | ICD-10-CM | POA: Diagnosis not present

## 2017-01-12 DIAGNOSIS — I129 Hypertensive chronic kidney disease with stage 1 through stage 4 chronic kidney disease, or unspecified chronic kidney disease: Secondary | ICD-10-CM | POA: Diagnosis not present

## 2017-01-12 DIAGNOSIS — E1122 Type 2 diabetes mellitus with diabetic chronic kidney disease: Secondary | ICD-10-CM | POA: Diagnosis not present

## 2017-01-12 NOTE — Telephone Encounter (Signed)
Fine

## 2017-01-12 NOTE — Telephone Encounter (Signed)
Verbal ok given.

## 2017-01-22 ENCOUNTER — Emergency Department (HOSPITAL_COMMUNITY): Payer: Medicare Other

## 2017-01-22 ENCOUNTER — Telehealth: Payer: Self-pay | Admitting: Internal Medicine

## 2017-01-22 ENCOUNTER — Emergency Department (HOSPITAL_COMMUNITY)
Admission: EM | Admit: 2017-01-22 | Discharge: 2017-01-22 | Disposition: A | Discharge status: Home / Self Care | Payer: Medicare Other | Attending: Emergency Medicine | Admitting: Emergency Medicine

## 2017-01-22 DIAGNOSIS — E1122 Type 2 diabetes mellitus with diabetic chronic kidney disease: Secondary | ICD-10-CM | POA: Diagnosis not present

## 2017-01-22 DIAGNOSIS — I129 Hypertensive chronic kidney disease with stage 1 through stage 4 chronic kidney disease, or unspecified chronic kidney disease: Secondary | ICD-10-CM | POA: Diagnosis not present

## 2017-01-22 DIAGNOSIS — Y92129 Unspecified place in nursing home as the place of occurrence of the external cause: Secondary | ICD-10-CM | POA: Diagnosis not present

## 2017-01-22 DIAGNOSIS — Z8673 Personal history of transient ischemic attack (TIA), and cerebral infarction without residual deficits: Secondary | ICD-10-CM | POA: Diagnosis not present

## 2017-01-22 DIAGNOSIS — J45909 Unspecified asthma, uncomplicated: Secondary | ICD-10-CM | POA: Diagnosis not present

## 2017-01-22 DIAGNOSIS — S0003XA Contusion of scalp, initial encounter: Secondary | ICD-10-CM | POA: Diagnosis not present

## 2017-01-22 DIAGNOSIS — N189 Chronic kidney disease, unspecified: Secondary | ICD-10-CM | POA: Insufficient documentation

## 2017-01-22 DIAGNOSIS — Y939 Activity, unspecified: Secondary | ICD-10-CM | POA: Insufficient documentation

## 2017-01-22 DIAGNOSIS — Z91040 Latex allergy status: Secondary | ICD-10-CM | POA: Insufficient documentation

## 2017-01-22 DIAGNOSIS — W19XXXA Unspecified fall, initial encounter: Secondary | ICD-10-CM

## 2017-01-22 DIAGNOSIS — W050XXA Fall from non-moving wheelchair, initial encounter: Secondary | ICD-10-CM | POA: Insufficient documentation

## 2017-01-22 DIAGNOSIS — Z7982 Long term (current) use of aspirin: Secondary | ICD-10-CM | POA: Diagnosis not present

## 2017-01-22 DIAGNOSIS — Y999 Unspecified external cause status: Secondary | ICD-10-CM | POA: Insufficient documentation

## 2017-01-22 DIAGNOSIS — Z87891 Personal history of nicotine dependence: Secondary | ICD-10-CM | POA: Diagnosis not present

## 2017-01-22 DIAGNOSIS — Z79899 Other long term (current) drug therapy: Secondary | ICD-10-CM | POA: Diagnosis not present

## 2017-01-22 DIAGNOSIS — S0990XA Unspecified injury of head, initial encounter: Secondary | ICD-10-CM | POA: Diagnosis present

## 2017-01-22 MED ORDER — LORAZEPAM 2 MG/ML IJ SOLN
1.0000 mg | INTRAMUSCULAR | Status: DC | PRN
  Administered 2017-01-22: 1 mg via INTRAVENOUS
  Filled 2017-01-22: qty 1

## 2017-01-22 NOTE — Telephone Encounter (Signed)
Yes, okay.

## 2017-01-22 NOTE — Discharge Instructions (Signed)
No specific instructions. Expect Eccymosis around eye to spread to eye, face, etc.

## 2017-01-22 NOTE — ED Triage Notes (Addendum)
Pt arrived via EMS after falling [facility] wheelchair [facility]. Pt has large hematoma to her left eyebrow and pain in left shoulder and left hip. Pt has hx of Sundowners. Pt is alert, but not oriented at baseline.

## 2017-01-22 NOTE — Telephone Encounter (Signed)
LVM with Monique giving ok

## 2017-01-22 NOTE — ED Notes (Signed)
[facility] called for Pt 

## 2017-01-22 NOTE — Telephone Encounter (Signed)
Monique from [facility] called stating that she would like to put the pt on hold for 2 weeks due to a UTI and altered mental status causing her to be unable to cooperate with treatment. They will recertify the pt and restart treatment on February 11, 2017.

## 2017-01-22 NOTE — ED Provider Notes (Signed)
[facility]-EMERGENCY DEPT Provider Note   CSN: [id number] Arrival date & time: 01/22/17  1818     History   Chief Complaint Chief Complaint  Patient presents with  . Fall    HPI Jamie Aguirre is a 71 y.o. female. Chief complaint is fall.  HPI 71 year old female with history of dementia and previous stroke. Resides at skilled nursing facility. She was leaning forward and fell 4 out of her wheelchair and struck her left frontal for head on the floor. Complains of pain with her shoulder. Immobilized with cervical collar and transferred here.  Past Medical History:  Diagnosis Date  . Allergic rhinitis, seasonal    assocaited with vertigo sensation  . Arthritis   . Asthma   . Chronic bronchitis   . Chronic kidney disease   . Diverticulitis   . Headaches, cluster   . Hives    idiopathic  . Hyperlipidemia   . Hypertension   . Pellucid marginal degeneration of cornea   . 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 Type 2 diabetes mellitus [facility])     Patient Active Problem List   Diagnosis Date Noted  . Hypertensive emergency 10/21/2016  . Stroke (HCC) 10/21/2016  . Urinary frequency 10/21/2016  . Left hip pain 10/21/2016  . Arthritis of left ankle 11/09/2015  . Abnormal PFTs (pulmonary function tests) 07/22/2015  . Chronic venous insufficiency 04/12/2015  . Routine general medical examination at a health care facility 08/27/2014  . Chronic diarrhea of unknown origin 05/15/2014  . Arthritis of right ankle 07/08/2013  . Depressive disorder 10/09/2012  . Vertigo   . OSA (obstructive sleep apnea)   . Obese 01/01/2012  . Type 2 diabetes mellitus (HCC)   . HTN (hypertension)   . Hyperlipidemia   . Acute ischemic left MCA stroke (HCC)   . Allergic rhinitis, seasonal     Past Surgical History:  Procedure Laterality Date  . CATARACT EXTRACTION W/ INTRAOCULAR LENS IMPLANT Left 02/04/13   Dr Valere Dross    OB History    No data available       Home Medications     Prior to Admission medications   Medication Sig Start Date End Date Taking? Authorizing Provider  albuterol (VENTOLIN HFA) 108 (90 BASE) MCG/ACT inhaler Inhale 2 puffs into the lungs every 6 (six) hours as needed. Patient not taking: Reported on 10/21/2016 08/17/14   Myrlene Broker, MD  aspirin EC 325 MG EC tablet Take 1 tablet (325 mg total) by mouth daily. 10/28/16   Penny Pia, MD  atorvastatin (LIPITOR) 40 MG tablet Take 1 tablet (40 mg total) by mouth daily at 6 PM. 10/27/16   Penny Pia, MD  clopidogrel (PLAVIX) 75 MG tablet TAKE 1 TABLET BY MOUTH ONCE DAILY 08/27/15   Myrlene Broker, MD  JENTADUETO 2.12-998 MG TABS TAKE 1 TABLET BY MOUTH TWICE DAILY 12/24/15   Myrlene Broker, MD  lipase/protease/amylase (CREON) 36000 UNITS CPEP capsule Take 1 capsule (36,000 Units total) by mouth 3 (three) times daily before meals. Patient not taking: Reported on 10/21/2016 07/27/16   Myrlene Broker, MD  losartan (COZAAR) 100 MG tablet TAKE 1 TABLET BY MOUTH EVERY DAY 09/04/16   Myrlene Broker, MD  metoprolol succinate (TOPROL-XL) 100 MG 24 hr tablet TAKE 1 TABLET BY MOUTH TWICE A DAY 09/04/16   Myrlene Broker, MD  silver sulfADIAZINE (SILVADENE) 1 % cream Apply 1 application topically as needed (for infection).  08/26/14   [provider]  triamcinolone cream (KENALOG) 0.1 % Apply 1 application topically 2 (two) times daily as needed. Patient taking differently: Apply 1 application topically 2 (two) times daily as needed (for irritation).  02/28/16   Myrlene Broker, MD    Family History Family History  Problem Relation Age of Onset  . Heart disease Father   . Arthritis Other   . Stroke Other   . Hypertension Other   . Hyperlipidemia Other   . Diabetes Other     Social History Social History  Substance Use Topics  . Smoking status: Former Smoker    Packs/day: 3.50    Years: 20.00    Types: Cigarettes    Quit date: 08/15/1975  . Smokeless  tobacco: Never Used  . Alcohol use No     Allergies   Codeine; Penicillins; Ciprofloxacin; Lactose intolerance (gi); Latex; Spinach; Sulfa antibiotics; and Tape   Review of Systems Review of Systems  Unable to perform ROS: Dementia  Level V caveat   Physical Exam Updated Vital Signs BP (!) 181/86   Pulse 98   Resp (!) 22   SpO2 94%   Physical Exam  Constitutional: She appears well-developed and well-nourished. No distress.  HENT:  Head: Normocephalic.  Large left frontal forehead scalp hematoma. Facial droop movements normal. No proptosis or enophthalmos. Lobe normal. Symmetric reactive pupils. No blood over the TMs or mastoids. No instability of the nasal, or facial bones. No mandibular or maxillary trauma apparent.  Eyes: Conjunctivae are normal. Pupils are equal, round, and reactive to light. No scleral icterus.  Neck: Normal range of motion. Neck supple. No thyromegaly present.  Cardiovascular: Normal rate and regular rhythm.  Exam reveals no gallop and no friction rub.   No murmur heard. Pulmonary/Chest: Effort normal and breath sounds normal. No respiratory distress. She has no wheezes. She has no rales.  Abdominal: Soft. Bowel sounds are normal. She exhibits no distension. There is no tenderness. There is no rebound.  Musculoskeletal: Normal range of motion.  Left-sided weakness noted. No complaint of pain over the hip, or shoulder.  Neurological: She is alert.  Left hemiparesis.  Skin: Skin is warm and dry. No rash noted.  Psychiatric: She has a normal mood and affect. Her behavior is normal.     ED Treatments / Results  Labs (all labs ordered are listed, but only abnormal results are displayed) Labs Reviewed - No data to display  EKG  EKG Interpretation None       Radiology Ct Head Wo Contrast  Result Date: 01/22/2017 CLINICAL DATA:  Fall from wheelchair at nursing home today with left periorbital hematoma. EXAM: CT HEAD WITHOUT CONTRAST CT CERVICAL  SPINE WITHOUT CONTRAST TECHNIQUE: Multidetector CT imaging of the head and cervical spine was performed following the standard protocol without intravenous contrast. Multiplanar CT image reconstructions of the cervical spine were also generated. COMPARISON:  Head CT 10/23/2016 FINDINGS: CT HEAD FINDINGS There is motion artifact. Brain: Ventricles and cisterns are within normal. There is chronic ischemic microvascular disease present. Old lacunar infarct over the head of the right caudate nucleus as well as the right periventricular region unchanged. Chronic ischemic change over the junction of the midbrain to pons. No evidence of mass, mass effect, shift of midline structures or acute hemorrhage. No definite acute infarction. Vascular: No hyperdense vessel or unexpected calcification. Skull: Normal. Negative for fracture or focal lesion. Sinuses/Orbits: Orbits are within normal.  Sinuses are clear. Other: Large left periorbital/ frontal scalp contusion. No underlying fracture.  CT CERVICAL SPINE FINDINGS There is mild motion artifact. Alignment: Mild curvature convex left. Subtle posterior subluxation of C5 on C6 likely degenerative. Skull base and vertebrae: Mild to moderate spondylosis of the cervical spine to include uncovertebral joint spurring and facet arthropathy. Atlantoaxial articulation is within normal. No definite acute fracture or traumatic subluxation. Significant neural foraminal narrowing at several levels due to adjacent bony spurring. Soft tissues and spinal canal: No prevertebral fluid or swelling. No visible canal hematoma. Disc levels: Moderate disc space narrowing at the C5-6 level and to lesser extent at the C6-7 level. Upper chest: 1.2 cm hypodense nodule over the left thyroid. Other: None. IMPRESSION: No acute intracranial findings. Large left periportal/ frontal scalp contusion. No underlying fracture. Chronic ischemic microvascular disease and small old lacunar infarcts as described. No  acute cervical spine injury. Moderate spondylosis of the cervical spine with multilevel disc disease and multilevel neural foraminal narrowing as described. Electronically Signed   By: Elberta Fortisaniel  Boyle M.D.   On: 01/22/2017 20:27   Ct Cervical Spine Wo Contrast  Result Date: 01/22/2017 CLINICAL DATA:  Fall from wheelchair at nursing home today with left periorbital hematoma. EXAM: CT HEAD WITHOUT CONTRAST CT CERVICAL SPINE WITHOUT CONTRAST TECHNIQUE: Multidetector CT imaging of the head and cervical spine was performed following the standard protocol without intravenous contrast. Multiplanar CT image reconstructions of the cervical spine were also generated. COMPARISON:  Head CT 10/23/2016 FINDINGS: CT HEAD FINDINGS There is motion artifact. Brain: Ventricles and cisterns are within normal. There is chronic ischemic microvascular disease present. Old lacunar infarct over the head of the right caudate nucleus as well as the right periventricular region unchanged. Chronic ischemic change over the junction of the midbrain to pons. No evidence of mass, mass effect, shift of midline structures or acute hemorrhage. No definite acute infarction. Vascular: No hyperdense vessel or unexpected calcification. Skull: Normal. Negative for fracture or focal lesion. Sinuses/Orbits: Orbits are within normal.  Sinuses are clear. Other: Large left periorbital/ frontal scalp contusion. No underlying fracture. CT CERVICAL SPINE FINDINGS There is mild motion artifact. Alignment: Mild curvature convex left. Subtle posterior subluxation of C5 on C6 likely degenerative. Skull base and vertebrae: Mild to moderate spondylosis of the cervical spine to include uncovertebral joint spurring and facet arthropathy. Atlantoaxial articulation is within normal. No definite acute fracture or traumatic subluxation. Significant neural foraminal narrowing at several levels due to adjacent bony spurring. Soft tissues and spinal canal: No prevertebral  fluid or swelling. No visible canal hematoma. Disc levels: Moderate disc space narrowing at the C5-6 level and to lesser extent at the C6-7 level. Upper chest: 1.2 cm hypodense nodule over the left thyroid. Other: None. IMPRESSION: No acute intracranial findings. Large left periportal/ frontal scalp contusion. No underlying fracture. Chronic ischemic microvascular disease and small old lacunar infarcts as described. No acute cervical spine injury. Moderate spondylosis of the cervical spine with multilevel disc disease and multilevel neural foraminal narrowing as described. Electronically Signed   By: Elberta Fortisaniel  Boyle M.D.   On: 01/22/2017 20:27   Dg Shoulder Left  Result Date: 01/22/2017 CLINICAL DATA:  Left shoulder and hip pain after a fall EXAM: LEFT SHOULDER - 2+ VIEW COMPARISON:  None. FINDINGS: Left lung apex is clear. Moderate AC joint degenerative changes. No fracture or dislocation. IMPRESSION: No acute osseous abnormality. Electronically Signed   By: Jasmine PangKim  Fujinaga M.D.   On: 01/22/2017 19:51   Dg Hip Unilat W Or Wo Pelvis 2-3 Views Left  Result Date: 01/22/2017 CLINICAL DATA:  Left hip pain after a fall EXAM: DG HIP (WITH OR WITHOUT PELVIS) 2-3V LEFT COMPARISON:  10/21/2016 FINDINGS: The SI joints are symmetric. Pubic symphysis appears intact. No acute displaced fracture or malalignment. IMPRESSION: No acute osseous abnormality Electronically Signed   By: Jasmine Pang M.D.   On: 01/22/2017 19:53    Procedures Procedures (including critical care time)  Medications Ordered in ED Medications  LORazepam (ATIVAN) injection 1 mg (1 mg Intravenous Given 01/22/17 1903)     Initial Impression / Assessment and Plan / ED Course  I have reviewed the triage vital signs and the nursing notes.  Pertinent labs & imaging results that were available during my care of the patient were reviewed by me and considered in my medical decision making (see chart for details).     Imaging reassuring other than  hematoma. Plan symptomatically treatment. Discharged home.  Final Clinical Impressions(s) / ED Diagnoses   Final diagnoses:  Fall, initial encounter    New Prescriptions New Prescriptions   No medications on file     Rolland Porter, MD 01/22/17 2248

## 2017-01-22 NOTE — Telephone Encounter (Signed)
Is this ok? Please advise in Dr.crawfords absence

## 2017-01-23 ENCOUNTER — Telehealth: Payer: Self-pay | Admitting: Internal Medicine

## 2017-01-23 NOTE — Telephone Encounter (Signed)
[facility] requesting verbal orders for home health OT to be placed on hold for 2 weeks due to the pt having a UTI and not following directions well. She also wanted to let us know the pt fell last night and was taken to the ER. She has since been returned back to assisted living. When [place] checked [place] BP today it was 180/100. The assisted living location is monitoring this.

## 2017-01-23 NOTE — Telephone Encounter (Signed)
Please advise since I sent the other message regarding patient to you the other day

## 2017-01-24 NOTE — Telephone Encounter (Signed)
SPOKE WITH MICHELLE AND GIVE HER THE VERBALS.

## 2017-01-24 NOTE — Telephone Encounter (Signed)
Yes, okay for verbal orders for the request listed

## 2017-01-24 NOTE — Telephone Encounter (Signed)
Jamie Aguirre is calling again about this. She would like a call back with. [phone]

## 2017-01-26 ENCOUNTER — Emergency Department (HOSPITAL_COMMUNITY): Payer: Medicare Other

## 2017-01-26 ENCOUNTER — Encounter (HOSPITAL_COMMUNITY): Payer: Self-pay

## 2017-01-26 ENCOUNTER — Inpatient Hospital Stay (HOSPITAL_COMMUNITY)
Admission: EM | Admit: 2017-01-26 | Discharge: 2017-01-31 | DRG: 682 | Disposition: A | Discharge status: Nursing Home (Includes ICF) | Payer: Medicare Other | Attending: Family Medicine | Admitting: Family Medicine

## 2017-01-26 DIAGNOSIS — Z515 Encounter for palliative care: Secondary | ICD-10-CM

## 2017-01-26 DIAGNOSIS — Z7189 Other specified counseling: Secondary | ICD-10-CM

## 2017-01-26 DIAGNOSIS — J42 Unspecified chronic bronchitis: Secondary | ICD-10-CM | POA: Diagnosis present

## 2017-01-26 DIAGNOSIS — Z881 Allergy status to other antibiotic agents status: Secondary | ICD-10-CM | POA: Diagnosis not present

## 2017-01-26 DIAGNOSIS — Z794 Long term (current) use of insulin: Secondary | ICD-10-CM

## 2017-01-26 DIAGNOSIS — G9341 Metabolic encephalopathy: Secondary | ICD-10-CM | POA: Diagnosis present

## 2017-01-26 DIAGNOSIS — E785 Hyperlipidemia, unspecified: Secondary | ICD-10-CM | POA: Diagnosis not present

## 2017-01-26 DIAGNOSIS — J45909 Unspecified asthma, uncomplicated: Secondary | ICD-10-CM | POA: Diagnosis present

## 2017-01-26 DIAGNOSIS — I69922 Dysarthria following unspecified cerebrovascular disease: Secondary | ICD-10-CM

## 2017-01-26 DIAGNOSIS — F419 Anxiety disorder, unspecified: Secondary | ICD-10-CM | POA: Diagnosis not present

## 2017-01-26 DIAGNOSIS — R451 Restlessness and agitation: Secondary | ICD-10-CM | POA: Diagnosis not present

## 2017-01-26 DIAGNOSIS — G4733 Obstructive sleep apnea (adult) (pediatric): Secondary | ICD-10-CM | POA: Diagnosis present

## 2017-01-26 DIAGNOSIS — N183 Chronic kidney disease, stage 3 (moderate): Secondary | ICD-10-CM | POA: Diagnosis present

## 2017-01-26 DIAGNOSIS — Z882 Allergy status to sulfonamides status: Secondary | ICD-10-CM | POA: Diagnosis not present

## 2017-01-26 DIAGNOSIS — Z7902 Long term (current) use of antithrombotics/antiplatelets: Secondary | ICD-10-CM

## 2017-01-26 DIAGNOSIS — Z66 Do not resuscitate: Secondary | ICD-10-CM | POA: Diagnosis present

## 2017-01-26 DIAGNOSIS — Z6841 Body Mass Index (BMI) 40.0 and over, adult: Secondary | ICD-10-CM

## 2017-01-26 DIAGNOSIS — I63311 Cerebral infarction due to thrombosis of right middle cerebral artery: Secondary | ICD-10-CM | POA: Diagnosis not present

## 2017-01-26 DIAGNOSIS — E1122 Type 2 diabetes mellitus with diabetic chronic kidney disease: Secondary | ICD-10-CM | POA: Diagnosis present

## 2017-01-26 DIAGNOSIS — R52 Pain, unspecified: Secondary | ICD-10-CM | POA: Diagnosis not present

## 2017-01-26 DIAGNOSIS — N390 Urinary tract infection, site not specified: Secondary | ICD-10-CM | POA: Diagnosis present

## 2017-01-26 DIAGNOSIS — I10 Essential (primary) hypertension: Secondary | ICD-10-CM

## 2017-01-26 DIAGNOSIS — N170 Acute kidney failure with tubular necrosis: Principal | ICD-10-CM | POA: Diagnosis present

## 2017-01-26 DIAGNOSIS — N179 Acute kidney failure, unspecified: Secondary | ICD-10-CM | POA: Diagnosis not present

## 2017-01-26 DIAGNOSIS — Z885 Allergy status to narcotic agent status: Secondary | ICD-10-CM | POA: Diagnosis not present

## 2017-01-26 DIAGNOSIS — Z880 Allergy status to penicillin: Secondary | ICD-10-CM | POA: Diagnosis not present

## 2017-01-26 DIAGNOSIS — I69322 Dysarthria following cerebral infarction: Secondary | ICD-10-CM

## 2017-01-26 DIAGNOSIS — E119 Type 2 diabetes mellitus without complications: Secondary | ICD-10-CM

## 2017-01-26 DIAGNOSIS — I639 Cerebral infarction, unspecified: Secondary | ICD-10-CM | POA: Diagnosis present

## 2017-01-26 DIAGNOSIS — G40909 Epilepsy, unspecified, not intractable, without status epilepticus: Secondary | ICD-10-CM | POA: Diagnosis present

## 2017-01-26 DIAGNOSIS — E872 Acidosis: Secondary | ICD-10-CM | POA: Diagnosis present

## 2017-01-26 DIAGNOSIS — Z87891 Personal history of nicotine dependence: Secondary | ICD-10-CM | POA: Diagnosis not present

## 2017-01-26 DIAGNOSIS — E875 Hyperkalemia: Secondary | ICD-10-CM | POA: Diagnosis present

## 2017-01-26 DIAGNOSIS — I129 Hypertensive chronic kidney disease with stage 1 through stage 4 chronic kidney disease, or unspecified chronic kidney disease: Secondary | ICD-10-CM | POA: Diagnosis present

## 2017-01-26 DIAGNOSIS — F845 Asperger's syndrome: Secondary | ICD-10-CM | POA: Diagnosis present

## 2017-01-26 DIAGNOSIS — E1159 Type 2 diabetes mellitus with other circulatory complications: Secondary | ICD-10-CM | POA: Diagnosis not present

## 2017-01-26 DIAGNOSIS — F0280 Dementia in other diseases classified elsewhere without behavioral disturbance: Secondary | ICD-10-CM | POA: Diagnosis present

## 2017-01-26 DIAGNOSIS — I69954 Hemiplegia and hemiparesis following unspecified cerebrovascular disease affecting left non-dominant side: Secondary | ICD-10-CM | POA: Diagnosis not present

## 2017-01-26 DIAGNOSIS — I69320 Aphasia following cerebral infarction: Secondary | ICD-10-CM

## 2017-01-26 DIAGNOSIS — E1151 Type 2 diabetes mellitus with diabetic peripheral angiopathy without gangrene: Secondary | ICD-10-CM | POA: Diagnosis present

## 2017-01-26 DIAGNOSIS — Z79899 Other long term (current) drug therapy: Secondary | ICD-10-CM

## 2017-01-26 DIAGNOSIS — Z7982 Long term (current) use of aspirin: Secondary | ICD-10-CM

## 2017-01-26 LAB — URINALYSIS, ROUTINE W REFLEX MICROSCOPIC
Bilirubin Urine: NEGATIVE
Glucose, UA: NEGATIVE mg/dL
KETONES UR: NEGATIVE mg/dL
Nitrite: NEGATIVE
PROTEIN: 30 mg/dL — AB
SQUAMOUS EPITHELIAL / LPF: NONE SEEN
Specific Gravity, Urine: 1.016 (ref 1.005–1.030)
pH: 5 (ref 5.0–8.0)

## 2017-01-26 LAB — VALPROIC ACID LEVEL: Valproic Acid Lvl: 23 ug/mL — ABNORMAL LOW (ref 50.0–100.0)

## 2017-01-26 LAB — CBC WITH DIFFERENTIAL/PLATELET
BASOS ABS: 0 10*3/uL (ref 0.0–0.1)
Basophils Relative: 0 %
Eosinophils Absolute: 1.4 10*3/uL — ABNORMAL HIGH (ref 0.0–0.7)
Eosinophils Relative: 13 %
HEMATOCRIT: 34.5 % — AB (ref 36.0–46.0)
Hemoglobin: 10.8 g/dL — ABNORMAL LOW (ref 12.0–15.0)
LYMPHS PCT: 13 %
Lymphs Abs: 1.5 10*3/uL (ref 0.7–4.0)
MCH: 27.6 pg (ref 26.0–34.0)
MCHC: 31.3 g/dL (ref 30.0–36.0)
MCV: 88 fL (ref 78.0–100.0)
Monocytes Absolute: 1.5 10*3/uL — ABNORMAL HIGH (ref 0.1–1.0)
Monocytes Relative: 13 %
NEUTROS ABS: 6.9 10*3/uL (ref 1.7–7.7)
NEUTROS PCT: 61 %
Platelets: 314 10*3/uL (ref 150–400)
RBC: 3.92 MIL/uL (ref 3.87–5.11)
RDW: 17.1 % — ABNORMAL HIGH (ref 11.5–15.5)
WBC: 11.3 10*3/uL — AB (ref 4.0–10.5)

## 2017-01-26 LAB — GLUCOSE, CAPILLARY
GLUCOSE-CAPILLARY: 113 mg/dL — AB (ref 65–99)
GLUCOSE-CAPILLARY: 68 mg/dL (ref 65–99)
GLUCOSE-CAPILLARY: 95 mg/dL (ref 65–99)
Glucose-Capillary: 99 mg/dL (ref 65–99)

## 2017-01-26 LAB — COMPREHENSIVE METABOLIC PANEL
ALT: 13 U/L — AB (ref 14–54)
AST: 22 U/L (ref 15–41)
Albumin: 3.3 g/dL — ABNORMAL LOW (ref 3.5–5.0)
Alkaline Phosphatase: 47 U/L (ref 38–126)
Anion gap: 10 (ref 5–15)
BUN: 60 mg/dL — AB (ref 6–20)
CHLORIDE: 108 mmol/L (ref 101–111)
CO2: 19 mmol/L — ABNORMAL LOW (ref 22–32)
CREATININE: 4.75 mg/dL — AB (ref 0.44–1.00)
Calcium: 9.2 mg/dL (ref 8.9–10.3)
GFR calc Af Amer: 10 mL/min — ABNORMAL LOW (ref >60)
GFR, EST NON AFRICAN AMERICAN: 9 mL/min — AB (ref >60)
Glucose, Bld: 77 mg/dL (ref 65–99)
Potassium: 5.2 mmol/L — ABNORMAL HIGH (ref 3.5–5.1)
Sodium: 137 mmol/L (ref 135–145)
TOTAL PROTEIN: 6.9 g/dL (ref 6.5–8.1)
Total Bilirubin: 0.6 mg/dL (ref 0.3–1.2)

## 2017-01-26 LAB — MRSA PCR SCREENING: MRSA BY PCR: NEGATIVE

## 2017-01-26 LAB — CREATININE, URINE, RANDOM: CREATININE, URINE: 130.25 mg/dL

## 2017-01-26 LAB — SODIUM, URINE, RANDOM: Sodium, Ur: 43 mmol/L

## 2017-01-26 MED ORDER — CLOPIDOGREL BISULFATE 75 MG PO TABS
75.0000 mg | ORAL_TABLET | Freq: Every day | ORAL | Status: DC
  Administered 2017-01-26 – 2017-01-29 (×4): 75 mg via ORAL
  Filled 2017-01-26 (×4): qty 1

## 2017-01-26 MED ORDER — ALUM & MAG HYDROXIDE-SIMETH 200-200-20 MG/5ML PO SUSP: 30.0000 mL | Freq: Four times a day (QID) | ORAL | Status: DC | PRN

## 2017-01-26 MED ORDER — GUAIFENESIN 100 MG/5ML PO SOLN
400.0000 mg | ORAL | Status: DC | PRN
  Filled 2017-01-26: qty 20

## 2017-01-26 MED ORDER — MELATONIN 3 MG PO TABS
3.0000 mg | ORAL_TABLET | Freq: Every day | ORAL | Status: DC | PRN
  Administered 2017-01-29 – 2017-01-30 (×2): 3 mg via ORAL
  Filled 2017-01-26 (×4): qty 1

## 2017-01-26 MED ORDER — BUSPIRONE HCL 15 MG PO TABS
15.0000 mg | ORAL_TABLET | Freq: Two times a day (BID) | ORAL | Status: DC
Start: 2017-01-26 — End: 2017-01-31
  Administered 2017-01-26 – 2017-01-31 (×9): 15 mg via ORAL
  Filled 2017-01-26 (×9): qty 1

## 2017-01-26 MED ORDER — SODIUM CHLORIDE 0.9 % IV BOLUS (SEPSIS)
1000.0000 mL | Freq: Once | INTRAVENOUS | Status: AC
  Administered 2017-01-26: 1000 mL via INTRAVENOUS

## 2017-01-26 MED ORDER — HEPARIN SODIUM (PORCINE) 5000 UNIT/ML IJ SOLN
5000.0000 [IU] | Freq: Three times a day (TID) | INTRAMUSCULAR | Status: DC
  Administered 2017-01-27 – 2017-01-31 (×13): 5000 [IU] via SUBCUTANEOUS
  Filled 2017-01-26 (×14): qty 1

## 2017-01-26 MED ORDER — CEFEPIME HCL 1 G IJ SOLR
1.0000 g | Freq: Once | INTRAMUSCULAR | Status: DC
  Filled 2017-01-26: qty 1

## 2017-01-26 MED ORDER — ONDANSETRON HCL 4 MG PO TABS: 4.0000 mg | ORAL_TABLET | Freq: Four times a day (QID) | ORAL | Status: DC | PRN

## 2017-01-26 MED ORDER — SODIUM BICARBONATE 650 MG PO TABS
1300.0000 mg | ORAL_TABLET | Freq: Three times a day (TID) | ORAL | Status: DC
  Administered 2017-01-26 – 2017-01-29 (×7): 1300 mg via ORAL
  Filled 2017-01-26 (×7): qty 2

## 2017-01-26 MED ORDER — ACETAMINOPHEN 325 MG PO TABS
650.0000 mg | ORAL_TABLET | Freq: Four times a day (QID) | ORAL | Status: DC | PRN
  Filled 2017-01-26: qty 2

## 2017-01-26 MED ORDER — TRAZODONE HCL 100 MG PO TABS
100.0000 mg | ORAL_TABLET | Freq: Every day | ORAL | Status: DC
  Administered 2017-01-26 – 2017-01-30 (×4): 100 mg via ORAL
  Filled 2017-01-26 (×5): qty 1

## 2017-01-26 MED ORDER — INSULIN ASPART 100 UNIT/ML ~~LOC~~ SOLN: 0.0000 [IU] | SUBCUTANEOUS | Status: DC

## 2017-01-26 MED ORDER — INSULIN DETEMIR 100 UNIT/ML ~~LOC~~ SOLN
16.0000 [IU] | Freq: Two times a day (BID) | SUBCUTANEOUS | Status: DC
  Administered 2017-01-27 (×2): 16 [IU] via SUBCUTANEOUS
  Filled 2017-01-26 (×9): qty 0.16

## 2017-01-26 MED ORDER — LOPERAMIDE HCL 2 MG PO CAPS: 2.0000 mg | ORAL_CAPSULE | ORAL | Status: DC | PRN

## 2017-01-26 MED ORDER — LORAZEPAM 0.5 MG PO TABS
0.5000 mg | ORAL_TABLET | Freq: Three times a day (TID) | ORAL | Status: DC | PRN
  Administered 2017-01-27 – 2017-01-29 (×2): 0.5 mg via ORAL
  Filled 2017-01-26 (×2): qty 1

## 2017-01-26 MED ORDER — ALBUTEROL SULFATE (2.5 MG/3ML) 0.083% IN NEBU: 2.5000 mg | INHALATION_SOLUTION | RESPIRATORY_TRACT | Status: DC | PRN

## 2017-01-26 MED ORDER — ATORVASTATIN CALCIUM 40 MG PO TABS
40.0000 mg | ORAL_TABLET | Freq: Every day | ORAL | Status: DC
  Administered 2017-01-26 – 2017-01-29 (×4): 40 mg via ORAL
  Filled 2017-01-26 (×4): qty 1

## 2017-01-26 MED ORDER — ZINC OXIDE 40 % EX OINT
TOPICAL_OINTMENT | CUTANEOUS | Status: DC | PRN
  Filled 2017-01-26: qty 114

## 2017-01-26 MED ORDER — ZINC OXIDE 13 % EX CREA: 1.0000 "application " | TOPICAL_CREAM | CUTANEOUS | Status: DC

## 2017-01-26 MED ORDER — ASPIRIN EC 325 MG PO TBEC
325.0000 mg | DELAYED_RELEASE_TABLET | Freq: Every day | ORAL | Status: DC
  Administered 2017-01-27 – 2017-01-30 (×3): 325 mg via ORAL
  Filled 2017-01-26 (×3): qty 1

## 2017-01-26 MED ORDER — PANCRELIPASE (LIP-PROT-AMYL) 36000-114000 UNITS PO CPEP
36000.0000 [IU] | ORAL_CAPSULE | Freq: Three times a day (TID) | ORAL | Status: DC
  Administered 2017-01-26 – 2017-01-31 (×11): 36000 [IU] via ORAL
  Filled 2017-01-26 (×13): qty 1

## 2017-01-26 MED ORDER — SIMETHICONE 80 MG PO CHEW
160.0000 mg | CHEWABLE_TABLET | Freq: Two times a day (BID) | ORAL | Status: DC
  Administered 2017-01-26 – 2017-01-30 (×7): 160 mg via ORAL
  Filled 2017-01-26 (×7): qty 2

## 2017-01-26 MED ORDER — METOPROLOL SUCCINATE ER 100 MG PO TB24
100.0000 mg | ORAL_TABLET | Freq: Two times a day (BID) | ORAL | Status: DC
  Administered 2017-01-26 – 2017-01-31 (×9): 100 mg via ORAL
  Filled 2017-01-26 (×9): qty 1

## 2017-01-26 MED ORDER — ONDANSETRON HCL 4 MG/2ML IJ SOLN
4.0000 mg | Freq: Four times a day (QID) | INTRAMUSCULAR | Status: DC | PRN
  Administered 2017-01-28: 4 mg via INTRAVENOUS
  Filled 2017-01-26: qty 2

## 2017-01-26 MED ORDER — MIRTAZAPINE 15 MG PO TABS
15.0000 mg | ORAL_TABLET | Freq: Every day | ORAL | Status: DC
  Administered 2017-01-26 – 2017-01-29 (×3): 15 mg via ORAL
  Filled 2017-01-26 (×4): qty 1

## 2017-01-26 MED ORDER — DIVALPROEX SODIUM 250 MG PO DR TAB
250.0000 mg | DELAYED_RELEASE_TABLET | Freq: Two times a day (BID) | ORAL | Status: DC
  Administered 2017-01-26 – 2017-01-31 (×8): 250 mg via ORAL
  Filled 2017-01-26 (×11): qty 1

## 2017-01-26 MED ORDER — SODIUM CHLORIDE 0.9 % IV SOLN
INTRAVENOUS | Status: DC
  Administered 2017-01-27 – 2017-01-28 (×2): via INTRAVENOUS

## 2017-01-26 MED ORDER — DEXTROSE 5 % IV SOLN
1.0000 g | INTRAVENOUS | Status: DC
  Administered 2017-01-26 – 2017-01-27 (×2): 1 g via INTRAVENOUS
  Filled 2017-01-26 (×3): qty 1

## 2017-01-26 NOTE — H&P (Addendum)
History and Physical    Marcus Groll ZOX:[id number] DOB: 10-26-1945 DOA: 01/26/2017  Referring MD/NP/PA: Dr. Rosalia Hammers PCP: Myrlene Broker, MD  Patient coming from: [facility] via EMS  Chief Complaint: Abnormal lab work  HPI: Jamie Aguirre is a 71 y.o. 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 Patient had just had a recent fall approximately 2 days ago for which x-rays reveal no acute fractures.  Patient was just recently admitted to the hospital in 3/ 2018 for CVA. Following the CVA patient noted weakness for which she was discharged to a nursing facility thereafter due to the inability to care for herself. Patient notes since that time she's had decreased overall by mouth intake. She also reports nausea has worsened over the last few weeks with intermittent vomiting immediately after meals. Patient recently diagnosed with urinary tract infection which was Recent medications included vancomycin ( but developed toxic ankle level), then was switched to gentamicin, and lastly has been treated on 3 days of Levaquin, but reports allergy. Cultures were positive for pan sensitive pseudomoas.    ED Course: Renal ultrasound otherwise noted to be negative Review of Systems: As per HPI otherwise 10 point review of systems negative.   Past Medical History:  Diagnosis Date  . Allergic rhinitis, seasonal    assocaited with vertigo sensation  . Arthritis   . Asthma   . Chronic bronchitis   . Chronic kidney disease   . Diverticulitis   . Headaches, cluster   . Hives    idiopathic  . Hyperlipidemia   . Hypertension   . Pellucid marginal degeneration of cornea   . 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 Type 2 diabetes mellitus (HCC)     Past Surgical History:  Procedure Laterality Date  . CATARACT EXTRACTION W/ INTRAOCULAR LENS  IMPLANT Left 02/04/13   Dr Valere Dross     reports that she quit smoking about 41 years ago. Her smoking use included Cigarettes. She has a 70.00 pack-year smoking history. She has never used smokeless tobacco. She reports that she does not drink alcohol or use drugs.  Allergies  Allergen Reactions  . Codeine Shortness Of Breath and Other (See Comments)    Flu-like symptoms (also)  . Penicillins Anaphylaxis    Has patient had a PCN reaction causing immediate rash, facial/tongue/throat swelling, SOB or lightheadedness with hypotension: Yes Has patient had a PCN reaction causing severe rash involving mucus membranes or skin necrosis: Unk Has patient had a PCN reaction that required hospitalization: Yes (but was already in the hospital) Has patient had a PCN reaction occurring within the last 10 years: No If all of the above answers are "NO", then may proceed with Cephalosporin use.   . Ciprofloxacin Other (See Comments)    Results in IMMEDIATE VERTIGO  . Lactose Intolerance (Gi) Diarrhea  . Latex Dermatitis    Also causes skin to become red & bumpy  . Spinach Diarrhea    Nor kale, nor lettuce, no green foods, nor spicy foods (EXPLOSIVE DIARRHEA)  . Sulfa Antibiotics Other (See Comments)    Reaction unknown to patient (allergy from childhood)  . Tape Rash    Causes skin to become red & bumpy    Family History  Problem Relation Age of Onset  . Heart disease Father   . Arthritis Other   .  Stroke Other   . Hypertension Other   . Hyperlipidemia Other   . Diabetes Other     Prior to Admission medications   Medication Sig Start Date End Date Taking? Authorizing Provider  acetaminophen (TYLENOL 8 HOUR) 650 MG CR tablet Take 650 mg by mouth every 8 (eight) hours as needed (lower back pain). May also take every 6 hours for moderate pain   Yes [provider]  aspirin EC 325 MG EC tablet Take 1 tablet (325 mg total) by mouth daily. 10/28/16  Yes Penny Pia, MD  atorvastatin  (LIPITOR) 40 MG tablet Take 1 tablet (40 mg total) by mouth daily at 6 PM. 10/27/16  Yes Penny Pia, MD  busPIRone (BUSPAR) 15 MG tablet Take 15 mg by mouth 2 (two) times daily.   Yes [provider]  clopidogrel (PLAVIX) 75 MG tablet TAKE 1 TABLET BY MOUTH ONCE DAILY 08/27/15  Yes Myrlene Broker, MD  divalproex (DEPAKOTE) 125 MG DR tablet Take 250 mg by mouth 2 (two) times daily. 12/18/16  Yes [provider]  LEVEMIR FLEXTOUCH 100 UNIT/ML Pen Inject 16 Units into the skin 2 (two) times daily. 01/14/17  Yes [provider]  lipase/protease/amylase (CREON) 36000 UNITS CPEP capsule Take 1 capsule (36,000 Units total) by mouth 3 (three) times daily before meals. 07/27/16  Yes Myrlene Broker, MD  LORazepam (ATIVAN) 0.5 MG tablet Take 0.5 mg by mouth every 8 (eight) hours as needed for anxiety.  12/18/16  Yes [provider]  Melatonin 3 MG TABS Take 3 mg by mouth daily as needed (sleep).    Yes [provider]  metoprolol succinate (TOPROL-XL) 100 MG 24 hr tablet TAKE 1 TABLET BY MOUTH TWICE A DAY 09/04/16  Yes Myrlene Broker, MD  mirtazapine (REMERON) 15 MG tablet Take 15 mg by mouth at bedtime. For appetite stimulant 01/24/17  Yes [provider]  NOVOLOG FLEXPEN 100 UNIT/ML FlexPen Inject 0-10 Units into the skin 3 (three) times daily before meals. If 70-100=0u, 101-150= 2u, 151-200= 4u, 201-250= 6u, 251-300= 8u, 301-400= 10u 12/18/16  Yes [provider]  simethicone (MYLICON) 80 MG chewable tablet Chew 160 mg by mouth 2 (two) times daily.   Yes [provider]  traZODone (DESYREL) 50 MG tablet Take 100 mg by mouth at bedtime.  01/19/17  Yes [provider]  Zinc Oxide (DESITIN) 13 % CREA Apply 1 application topically as directed. Apply to buttocks with each incontinence and after every episode   Yes [provider]  albuterol (VENTOLIN HFA) 108 (90 BASE) MCG/ACT inhaler Inhale 2 puffs into the lungs  every 6 (six) hours as needed. 08/17/14   Myrlene Broker, MD  alum & mag hydroxide-simeth [facility]) 200-200-20 MG/5ML suspension Take 30 mLs by mouth every 6 (six) hours as needed.    [provider]  bisacodyl (DULCOLAX) 5 MG EC tablet Take 10 mg by mouth daily as needed (for constipation).    [provider]  guaiFENesin (ROBITUSSIN) 100 MG/5ML liquid Take 400 mg by mouth every 4 (four) hours as needed for cough.    [provider]  JENTADUETO 2.12-998 MG TABS TAKE 1 TABLET BY MOUTH TWICE DAILY Patient not taking: Reported on 01/26/2017 12/24/15   Myrlene Broker, MD  loperamide (IMODIUM A-D) 2 MG tablet Take 2 mg by mouth every 4 (four) hours as needed for diarrhea or loose stools.    [provider]  losartan (COZAAR) 100 MG tablet TAKE 1 TABLET  BY MOUTH EVERY DAY Patient not taking: Reported on 01/26/2017 09/04/16   Myrlene Brokerrawford, Elizabeth A, MD  triamcinolone cream (KENALOG) 0.1 % Apply 1 application topically 2 (two) times daily as needed. Patient not taking: Reported on 01/26/2017 02/28/16   Myrlene Brokerrawford, Elizabeth A, MD    Physical Exam:    Constitutional: Obese female who appears in no acute distress Vitals:   01/26/17 1200 01/26/17 1230 01/26/17 1345 01/26/17 1400  BP: (!) 143/95  (!) 167/78   Pulse: 73  75   Resp: 18 (!) 21  14  Temp:   97.6 F (36.4 C)   TempSrc:   Oral   SpO2: 99%  100%   Weight:      Height:       Eyes: PERRL, lids and conjunctivae normal ENMT: Mucous membranes are dry. Posterior pharynx clear of any exudate or lesions. Poor dentition.  Neck: normal, supple, no masses, no thyromegaly Respiratory: clear to auscultation bilaterally, no wheezing, no crackles. Normal respiratory effort. No accessory muscle use.  Cardiovascular: Regular rate and rhythm, no murmurs / rubs / gallops. +1 edema of the upper and lower extremity . 2+ pedal pulses. No carotid bruits.  Abdomen: no tenderness, no masses palpated. No  hepatosplenomegaly. Bowel sounds positive.  Musculoskeletal: no clubbing / cyanosis. No joint deformity upper and lower extremities. Good ROM, no contractures. Normal muscle tone.  Skin: Contusion present over the left for forehead with bruising present Neurologic: CN 2-12 grossly intact. Sensation intact, DTR normal. Dysarthria with expressive aphasia. Left-sided hemiparesis noted. Psychiatric: Normal judgment and insight. Alert and oriented x 3. Normal mood.     Labs on Admission: I have personally reviewed following labs and imaging studies  CBC:  Recent Labs Lab 01/26/17 1127  WBC 11.3*  NEUTROABS 6.9  HGB 10.8*  HCT 34.5*  MCV 88.0  PLT 314   Basic Metabolic Panel:  Recent Labs Lab 01/26/17 1127  NA 137  K 5.2*  CL 108  CO2 19*  GLUCOSE 77  BUN 60*  CREATININE 4.75*  CALCIUM 9.2   GFR: Estimated Creatinine Clearance: 13.1 mL/min (A) (by C-G formula based on SCr of 4.75 mg/dL (H)). Liver Function Tests:  Recent Labs Lab 01/26/17 1127  AST 22  ALT 13*  ALKPHOS 47  BILITOT 0.6  PROT 6.9  ALBUMIN 3.3*   No results for input(s): LIPASE, AMYLASE in the last 168 hours. No results for input(s): AMMONIA in the last 168 hours. Coagulation Profile: No results for input(s): INR, PROTIME in the last 168 hours. Cardiac Enzymes: No results for input(s): CKTOTAL, CKMB, CKMBINDEX, TROPONINI in the last 168 hours. BNP (last 3 results) No results for input(s): PROBNP in the last 8760 hours. HbA1C: No results for input(s): HGBA1C in the last 72 hours. CBG: No results for input(s): GLUCAP in the last 168 hours. Lipid Profile: No results for input(s): CHOL, HDL, LDLCALC, TRIG, CHOLHDL, LDLDIRECT in the last 72 hours. Thyroid Function Tests: No results for input(s): TSH, T4TOTAL, FREET4, T3FREE, THYROIDAB in the last 72 hours. Anemia Panel: No results for input(s): VITAMINB12, FOLATE, FERRITIN, TIBC, IRON, RETICCTPCT in the last 72 hours. Urine analysis:      Component Value Date/Time   COLORURINE STRAW (A) 10/21/2016 0919   APPEARANCEUR CLEAR 10/21/2016 0919   LABSPEC 1.009 10/21/2016 0919   PHURINE 8.0 10/21/2016 0919   GLUCOSEU >=500 (A) 10/21/2016 0919   GLUCOSEU NEGATIVE 01/11/2016 1109   HGBUR NEGATIVE 10/21/2016 0919   BILIRUBINUR NEGATIVE 10/21/2016 0919   BILIRUBINUR neg  02/07/2013 1543   KETONESUR NEGATIVE 10/21/2016 0919   PROTEINUR 30 (A) 10/21/2016 0919   UROBILINOGEN 0.2 01/11/2016 1109   NITRITE NEGATIVE 10/21/2016 0919   LEUKOCYTESUR NEGATIVE 10/21/2016 0919   Sepsis Labs: No results found for this or any previous visit (from the past 240 hour(s)).   Radiological Exams on Admission: Dg Chest 1 View  Result Date: 01/26/2017 CLINICAL DATA:  Fall. EXAM: CHEST 1 VIEW COMPARISON:  Radiographs of Jan 06, 2015. FINDINGS: Stable cardiomediastinal silhouette. No pneumothorax or pleural effusion is noted. No acute pulmonary disease is noted. Bony thorax is unremarkable. IMPRESSION: No acute cardiopulmonary abnormality seen. Electronically Signed   By: Lupita Raider, M.D.   On: 01/26/2017 13:48   Dg Knee 1-2 Views Left  Result Date: 01/26/2017 CLINICAL DATA:  Left knee pain after fall. EXAM: LEFT KNEE - 1-2 VIEW COMPARISON:  None. FINDINGS: No evidence of fracture, dislocation, or joint effusion. Severe narrowing of medial joint space is noted. Osteophyte formation is noted laterally. Soft tissues are unremarkable. IMPRESSION: Severe degenerative joint disease. No acute abnormality seen in the left knee. Electronically Signed   By: Lupita Raider, M.D.   On: 01/26/2017 13:49   Ct Head Wo Contrast  Result Date: 01/26/2017 CLINICAL DATA:  Fall with pain EXAM: CT HEAD WITHOUT CONTRAST TECHNIQUE: Contiguous axial images were obtained from the base of the skull through the vertex without intravenous contrast. COMPARISON:  January 22, 2017 FINDINGS: Brain: There is moderate diffuse atrophy. There is no intracranial mass, hemorrhage,  extra-axial fluid collection, or midline shift. There is small vessel disease throughout the centra semiovale bilaterally. There is a prior small infarct in the left centrum semiovale. There is small vessel disease in each external capsule. There is a small prior infarct involving a portion of the head of the caudate nucleus on the right. There is a prior infarct in the medial pons-midbrain junction on the right. There is no acute infarct evident. Vascular: There is no appreciable hyperdense vessel. There is calcification in each carotid siphon. Skull: There is a left frontal scalp hematoma. No fracture evident. Bony calvarium appears intact. Sinuses/Orbits: There is mucosal thickening in several ethmoid air cells bilaterally. There is moderate opacification in the posterior sphenoid sinuses bilaterally, more on the right than on the left. Visualized paranasal sinuses elsewhere clear. Visualized orbits appear symmetric bilaterally. Other: Mastoid air cells are clear. IMPRESSION: Atrophy with small vessel disease and prior infarcts as noted. No intracranial mass hemorrhage, or extra-axial fluid collection. No acute infarct evident. There are focal areas of vascular calcification. There is a left frontal scalp hematoma without underlying fracture. There are areas of paranasal sinus disease. Electronically Signed   By: Bretta Bang III M.D.   On: 01/26/2017 13:00   Ct Hip Left Wo Contrast  Result Date: 01/26/2017 CLINICAL DATA:  Left hip pain due to a fall 01/22/2017. Initial encounter. EXAM: CT OF THE LEFT HIP WITHOUT CONTRAST TECHNIQUE: Multidetector CT imaging of the left hip was performed according to the standard protocol. Multiplanar CT image reconstructions were also generated. COMPARISON:  Plain films left hip 01/22/2017. FINDINGS: Bones/Joint/Cartilage The left hip is located. No fracture is identified. No focal bony lesion is seen. Mild degenerative change is present about the left SI joint and hip.  Degenerative disc disease is partially visualized at L5-S1. Ligaments Suboptimally assessed by CT. Muscles and Tendons Intact and unremarkable. Soft tissues No acute abnormality is seen. Sigmoid diverticulosis and atherosclerosis are noted. IMPRESSION: Negative for fracture or  other acute abnormality. Very mild degenerative disease about the left hip and SI joint. Degenerative disc disease L5-S1. Sigmoid diverticulosis. Atherosclerosis. Electronically Signed   By: Drusilla Kanner M.D.   On: 01/26/2017 13:03   Dg Shoulder Left  Result Date: 01/26/2017 CLINICAL DATA:  Left shoulder pain after fall. EXAM: LEFT SHOULDER - 2+ VIEW COMPARISON:  Radiographs of January 22, 2017. FINDINGS: There is no evidence of fracture or dislocation. Moderate degenerative changes seen involving the left acromioclavicular joint. Soft tissues are unremarkable. IMPRESSION: Moderate degenerative joint disease of left acromioclavicular joint. No acute abnormality seen in the left shoulder. Electronically Signed   By: Lupita Raider, M.D.   On: 01/26/2017 13:43   Dg Foot 2 Views Left  Result Date: 01/26/2017 CLINICAL DATA:  Left foot pain after fall. EXAM: LEFT FOOT - 2 VIEW COMPARISON:  None. FINDINGS: There is no evidence of fracture or dislocation. There is no evidence of arthropathy or other focal bone abnormality. Soft tissues are unremarkable. IMPRESSION: No significant abnormality seen in the left foot. Electronically Signed   By: Lupita Raider, M.D.   On: 01/26/2017 13:41    EKG: Independently reviewed. Sinus rhythm with LAFB Assessment/Plan Acute renal failure: Acute. Patient presents with significantly elevated creatinine previously 1.37 on 3/15 and now 4.75 with a BUN of 60 on admission. Given history patient reports decreased oral intake suspect symptoms related to dehydration, but question possibility of AIN.  - Admit to a med-surg - Strict ins and outs - Avoid nephrotoxic agents - Follow-up urine studies - NS  IVF at 156ml/hr as tolerated - Appreciate nephrology consultative services, will follow-up for further recommendations  Urinary tract infection: Acute. Still patient appears to have a urinary tract infection on admission. - Follow-up urine culture - Cefepime performance  Hyperkalemia: Acute. Potassium 5.2 admission. - IVF as seen above  Diabetes mellitus type 2 - Hypoglycemic protocol - Continue Levemir 16 units bid - CBGs every before meals with sensitive SSI  Essential hypertension - Continue metoprolol  Anxiety - Continue Ativan prn anxiety, BuSpar  History of CVA with expressive aphasia and dysarthria - Continue Plavix and aspirin  Hyperlipidemia - Continue atorvastatin  DVT prophylaxis: heparin   Code Status: Full  Family Communication: discussed with family present at the bedside Disposition Plan: likely discharge back to nursing facility once medically stable Consults called: Nephrology  Admission status: Inpatient  Clydie Braun MD [facility] Pager [phone]  If 7PM-7AM, please contact night-coverage [email or web address] Password [email or web address]  01/26/2017, 2:02 PM

## 2017-01-26 NOTE — Progress Notes (Signed)
Subjective: Interval History: [age] yr female seen by Dr. Eliott Nineunham yest.  Has AKI.  Cr in 1/18 1.1, 3/18 1.30-1.41, 12/13/16 2.86, 12/15/16 2.59, 01/25/17 4.18.  Had toxic Vanc level on 12/13/16, and was put on Gentamicin recently. Hx pos Urine culture for pansensitive Pseudomonas recently.  Had 3 d of Levaquin but was "allergic".  She give limited hx , had major RMCA CVA 3/18.  Apparently prob voiding, has to sit awhile.  She cannot give more hx. aSlos on ARB  Objective: Vital signs in last 24 hours: Temp:  [96.6 F (35.9 C)] 96.6 F (35.9 C) (06/15 1153) Pulse Rate:  [72] 72 (06/15 1110) Resp:  [20] 20 (06/15 1110) BP: (152)/(85) 152/85 (06/15 1110) SpO2:  [100 %] 100 % (06/15 1110) Weight:  [106.6 kg (235 lb)] 106.6 kg (235 lb) (06/15 1111) Weight change:   Intake/Output from previous day: No intake/output data recorded. Intake/Output this shift: No intake/output data recorded.  General appearance: cooperative, moderately obese, pale and  Ox 1 Eyes: cooperative, moderately obese, pale and bruise L forehead and above L eye Ears: RHA, L ok Throat: RHA, L ok Neck: PCL Resp: diminished breath sounds bilaterally Cardio: S1, S2 normal and systolic murmur: systolic ejection 2/6, decrescendo at 2nd left intercostal space GI: obese, pos bs, soft, striae Extremities: edema 2+ on L , none on R, Larm drawn across chest and doesn't move, contracted Skin: bruises above, pale, striae Lymph nodes: Cervical adenopathy: PCL Neurologic: Motodoes not move L arm or leg. Dysesthetic diffuselydoes not move L arm or leg. Dysesthetic diffusely  Lab Results: No results for input(s): WBC, HGB, HCT, PLT in the last 72 hours. BMET: No results for input(s): NA, K, CL, CO2, GLUCOSE, BUN, CREATININE, CALCIUM in the last 72 hours. No results for input(s): PTH in the last 72 hours. Iron Studies: No results for input(s): IRON, TIBC, TRANSFERRIN, FERRITIN in the last 72 hours.  Studies/Results: No results found.  I  have reviewed the patient's current medications. Prior to Admission:  (Not in a hospital admission)  Assessment/Plan: 1 AKI most likely toxic, R/O obstruction. Vanc, Levaquin, Gent, ARB,  Cannot r/o AIN from infx.  Acidemic, mild ^ K, .  Need to assess 2 DM 3 CVA 4 HTN use Amlod 5 UTI  Culture 6 confusion 7 Obesity 8 OSA per primary P U/S, UA, foley, Urine for eos.     LOS: 0 days   Timon Geissinger L 01/26/2017,11:54 AM

## 2017-01-26 NOTE — ED Notes (Signed)
ED Provider at bedside. 

## 2017-01-26 NOTE — ED Notes (Signed)
Hospitalist t bedside

## 2017-01-26 NOTE — ED Notes (Signed)
Unable to place catheter 16 FR. Able to pass 8 FR fem cath and 20 cc voided. Specimen sent to lab.

## 2017-01-26 NOTE — ED Notes (Signed)
Pt returns from xray. C/o pain but unable to spedify site.

## 2017-01-26 NOTE — ED Notes (Signed)
IV attempted x 3 without success. IV team consulted. Dr. Arn Medalondell contacted and he states ok to send pt. To floor for IV team to see.

## 2017-01-26 NOTE — ED Notes (Signed)
IV does not flow or  Flush. Unable to withdraw. Appears clotted.

## 2017-01-26 NOTE — ED Notes (Signed)
IV attempted x2 without success.

## 2017-01-26 NOTE — ED Notes (Signed)
Patient transported to CT 

## 2017-01-26 NOTE — ED Triage Notes (Signed)
Pt arrives [facility] with c/o abnormal labs from [facility] . Seen Monday for fall with multiple bruising noted and pt screams when moved from stretcher.

## 2017-01-26 NOTE — ED Notes (Signed)
Warm blankets applied

## 2017-01-26 NOTE — ED Notes (Addendum)
Pt noted as having no information on monitor. Pt currently in US and VS collected by this nurse in US. Pt remains alert but not oriented.

## 2017-01-26 NOTE — ED Notes (Signed)
Dr. Arn Medalondell contacted to inform of foley non placement. States to attempt more appropriate size on floor.

## 2017-01-26 NOTE — ED Provider Notes (Signed)
[facility]-EMERGENCY DEPT Provider Note   CSN: [id number] Arrival date & time: 01/26/17  1050     History   Chief Complaint Chief Complaint  Patient presents with  . Weakness    HPI Jamie KaufmannLinda Aguirre is a 71 y.o. female.  HPI 71 year old female with recent fall from nursing home via EMS with reports she has abnormal labs. Review of prior workup reveals recent UTI with Pseudomonas. She had fall from wheelchair and was seen here 2 days ago and x-rays of her head, left shoulder, left hip. No fractures were seen. She has multiple contusions. Past Medical History:  Diagnosis Date  . Allergic rhinitis, seasonal    assocaited with vertigo sensation  . Arthritis   . Asthma   . Chronic bronchitis   . Chronic kidney disease   . Diverticulitis   . Headaches, cluster   . Hives    idiopathic  . Hyperlipidemia   . Hypertension   . Pellucid marginal degeneration of cornea   . 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 Type 2 diabetes mellitus [facility](HCC)     Patient Active Problem List   Diagnosis Date Noted  . Hypertensive emergency 10/21/2016  . Stroke (HCC) 10/21/2016  . Urinary frequency 10/21/2016  . Left hip pain 10/21/2016  . Arthritis of left ankle 11/09/2015  . Abnormal PFTs (pulmonary function tests) 07/22/2015  . Chronic venous insufficiency 04/12/2015  . Routine general medical examination at a health care facility 08/27/2014  . Chronic diarrhea of unknown origin 05/15/2014  . Arthritis of right ankle 07/08/2013  . Depressive disorder 10/09/2012  . Vertigo   . OSA (obstructive sleep apnea)   . Obese 01/01/2012  . Type 2 diabetes mellitus (HCC)   . HTN (hypertension)   . Hyperlipidemia   . Acute ischemic left MCA stroke (HCC)   . Allergic rhinitis, seasonal     Past Surgical History:  Procedure Laterality Date  . CATARACT EXTRACTION W/ INTRAOCULAR LENS IMPLANT Left 02/04/13   Dr Valere DrossGiegengack    OB History    No data available       Home Medications    Prior  to Admission medications   Medication Sig Start Date End Date Taking? Authorizing Provider  acetaminophen (TYLENOL 8 HOUR) 650 MG CR tablet Take 650 mg by mouth every 8 (eight) hours as needed (lower back pain). May also take every 6 hours for moderate pain   Yes [provider]  aspirin EC 325 MG EC tablet Take 1 tablet (325 mg total) by mouth daily. 10/28/16  Yes Penny PiaVega, [place], MD  atorvastatin (LIPITOR) 40 MG tablet Take 1 tablet (40 mg total) by mouth daily at 6 PM. 10/27/16  Yes Penny PiaVega, [place], MD  busPIRone (BUSPAR) 15 MG tablet Take 15 mg by mouth 2 (two) times daily.   Yes [provider]  clopidogrel (PLAVIX) 75 MG tablet TAKE 1 TABLET BY MOUTH ONCE DAILY 08/27/15  Yes Myrlene Brokerrawford, Elizabeth A, MD  divalproex (DEPAKOTE) 125 MG DR tablet Take 250 mg by mouth 2 (two) times daily. 12/18/16  Yes [provider]  LEVEMIR FLEXTOUCH 100 UNIT/ML Pen Inject 16 Units into the skin 2 (two) times daily. 01/14/17  Yes [provider]  lipase/protease/amylase (CREON) 36000 UNITS CPEP capsule Take 1 capsule (36,000 Units total) by mouth 3 (three) times daily before meals. 07/27/16  Yes Myrlene Brokerrawford, Elizabeth A, MD  LORazepam (ATIVAN) 0.5 MG tablet Take 0.5 mg by mouth every 8 (eight) hours as needed for anxiety.  12/18/16  Yes [provider]  Melatonin 3 MG TABS Take 3 mg by mouth daily as needed (sleep).    Yes [provider]  metoprolol succinate (TOPROL-XL) 100 MG 24 hr tablet TAKE 1 TABLET BY MOUTH TWICE A DAY 09/04/16  Yes Myrlene Broker, MD  mirtazapine (REMERON) 15 MG tablet Take 15 mg by mouth at bedtime. For appetite stimulant 01/24/17  Yes [provider]  NOVOLOG FLEXPEN 100 UNIT/ML FlexPen Inject 0-10 Units into the skin 3 (three) times daily before meals. If 70-100=0u, 101-150= 2u, 151-200= 4u, 201-250= 6u, 251-300= 8u, 301-400= 10u 12/18/16  Yes [provider]  simethicone (MYLICON) 80 MG chewable tablet Chew 160 mg by mouth 2 (two)  times daily.   Yes [provider]  traZODone (DESYREL) 50 MG tablet Take 100 mg by mouth at bedtime.  01/19/17  Yes [provider]  Zinc Oxide (DESITIN) 13 % CREA Apply 1 application topically as directed. Apply to buttocks with each incontinence and after every episode   Yes [provider]  albuterol (VENTOLIN HFA) 108 (90 BASE) MCG/ACT inhaler Inhale 2 puffs into the lungs every 6 (six) hours as needed. 08/17/14   Myrlene Broker, MD  alum & mag hydroxide-simeth [facility]) 200-200-20 MG/5ML suspension Take 30 mLs by mouth every 6 (six) hours as needed.    [provider]  bisacodyl (DULCOLAX) 5 MG EC tablet Take 10 mg by mouth daily as needed (for constipation).    [provider]  guaiFENesin (ROBITUSSIN) 100 MG/5ML liquid Take 400 mg by mouth every 4 (four) hours as needed for cough.    [provider]  JENTADUETO 2.12-998 MG TABS TAKE 1 TABLET BY MOUTH TWICE DAILY Patient not taking: Reported on 01/26/2017 12/24/15   Myrlene Broker, MD  loperamide (IMODIUM A-D) 2 MG tablet Take 2 mg by mouth every 4 (four) hours as needed for diarrhea or loose stools.    [provider]  losartan (COZAAR) 100 MG tablet TAKE 1 TABLET BY MOUTH EVERY DAY Patient not taking: Reported on 01/26/2017 09/04/16   Myrlene Broker, MD  triamcinolone cream (KENALOG) 0.1 % Apply 1 application topically 2 (two) times daily as needed. Patient not taking: Reported on 01/26/2017 02/28/16   Myrlene Broker, MD    Family History Family History  Problem Relation Age of Onset  . Heart disease Father   . Arthritis Other   . Stroke Other   . Hypertension Other   . Hyperlipidemia Other   . Diabetes Other     Social History Social History  Substance Use Topics  . Smoking status: Former Smoker    Packs/day: 3.50    Years: 20.00    Types: Cigarettes    Quit date: 08/15/1975  . Smokeless tobacco: Never Used  . Alcohol use No     Allergies    Codeine; Penicillins; Ciprofloxacin; Lactose intolerance (gi); Latex; Spinach; Sulfa antibiotics; and Tape   Review of Systems Review of Systems  All other systems reviewed and are negative.    Physical Exam Updated Vital Signs BP (!) 143/95   Pulse 73   Temp (!) 96.6 F (35.9 C) (Rectal)   Resp (!) 21   Ht 1.6 m (5\' 3" )   Wt 106.6 kg (235 lb)   SpO2 99%   BMI 41.63 kg/m   Physical Exam  Constitutional: She appears well-developed and well-nourished. She appears distressed.  HENT:  Head: Normocephalic.  Right Ear: External ear normal.  Left  Ear: External ear normal.  Left periorbital contusion and left forehead swelling  Eyes: Pupils are equal, round, and reactive to light.  Neck: Normal range of motion.  Cardiovascular: Normal rate.   Pulmonary/Chest: Effort normal and breath sounds normal.  Abdominal: Soft. Bowel sounds are normal.  Musculoskeletal:  Left shoulder held in adduction with diffuse tenderness on palpation. The tenderness noted of her elbow or wrist. Left hip tenderness to palpation Left foot with contusion and tenderness to palpation dorsal pedalis pulses are intact  Neurological: She is alert. No cranial nerve deficit. Coordination normal.  Skin: Skin is warm. Capillary refill takes less than 2 seconds.  Nursing note and vitals reviewed.    ED Treatments / Results  Labs (all labs ordered are listed, but only abnormal results are displayed) Labs Reviewed  VALPROIC ACID LEVEL - Abnormal; Notable for the following:       Result Value   Valproic Acid Lvl 23 (*)    All other components within normal limits  CBC WITH DIFFERENTIAL/PLATELET - Abnormal; Notable for the following:    WBC 11.3 (*)    Hemoglobin 10.8 (*)    HCT 34.5 (*)    RDW 17.1 (*)    Monocytes Absolute 1.5 (*)    Eosinophils Absolute 1.4 (*)    All other components within normal limits  COMPREHENSIVE METABOLIC PANEL - Abnormal; Notable for the following:    Potassium 5.2 (*)     CO2 19 (*)    BUN 60 (*)    Creatinine, Ser 4.75 (*)    Albumin 3.3 (*)    ALT 13 (*)    GFR calc non Af Amer 9 (*)    GFR calc Af Amer 10 (*)    All other components within normal limits  CULTURE, BLOOD (ROUTINE X 2)  CULTURE, BLOOD (ROUTINE X 2)  URINE CULTURE  SODIUM, URINE, RANDOM  CREATININE, URINE, RANDOM  CBC  DIFFERENTIAL  URINALYSIS, ROUTINE W REFLEX MICROSCOPIC  CYTOLOGY - NON PAP    EKG  EKG Interpretation  Date/Time:  Friday January 26 2017 11:53:38 EDT Ventricular Rate:  77 PR Interval:    QRS Duration: 94 QT Interval:  387 QTC Calculation: 438 R Axis:   -48 Text Interpretation:  Sinus rhythm Left anterior fascicular block Abnormal R-wave progression, late transition LVH with secondary repolarization abnormality Confirmed by Margarita Grizzle [phone]) on 01/26/2017 1:13:32 PM       Radiology Ct Head Wo Contrast  Result Date: 01/26/2017 CLINICAL DATA:  Fall with pain EXAM: CT HEAD WITHOUT CONTRAST TECHNIQUE: Contiguous axial images were obtained from the base of the skull through the vertex without intravenous contrast. COMPARISON:  January 22, 2017 FINDINGS: Brain: There is moderate diffuse atrophy. There is no intracranial mass, hemorrhage, extra-axial fluid collection, or midline shift. There is small vessel disease throughout the centra semiovale bilaterally. There is a prior small infarct in the left centrum semiovale. There is small vessel disease in each external capsule. There is a small prior infarct involving a portion of the head of the caudate nucleus on the right. There is a prior infarct in the medial pons-midbrain junction on the right. There is no acute infarct evident. Vascular: There is no appreciable hyperdense vessel. There is calcification in each carotid siphon. Skull: There is a left frontal scalp hematoma. No fracture evident. Bony calvarium appears intact. Sinuses/Orbits: There is mucosal thickening in several ethmoid air cells bilaterally. There is  moderate opacification in the posterior sphenoid sinuses bilaterally, more on  the right than on the left. Visualized paranasal sinuses elsewhere clear. Visualized orbits appear symmetric bilaterally. Other: Mastoid air cells are clear. IMPRESSION: Atrophy with small vessel disease and prior infarcts as noted. No intracranial mass hemorrhage, or extra-axial fluid collection. No acute infarct evident. There are focal areas of vascular calcification. There is a left frontal scalp hematoma without underlying fracture. There are areas of paranasal sinus disease. Electronically Signed   By: Bretta Bang III M.D.   On: 01/26/2017 13:00   Ct Hip Left Wo Contrast  Result Date: 01/26/2017 CLINICAL DATA:  Left hip pain due to a fall 01/22/2017. Initial encounter. EXAM: CT OF THE LEFT HIP WITHOUT CONTRAST TECHNIQUE: Multidetector CT imaging of the left hip was performed according to the standard protocol. Multiplanar CT image reconstructions were also generated. COMPARISON:  Plain films left hip 01/22/2017. FINDINGS: Bones/Joint/Cartilage The left hip is located. No fracture is identified. No focal bony lesion is seen. Mild degenerative change is present about the left SI joint and hip. Degenerative disc disease is partially visualized at L5-S1. Ligaments Suboptimally assessed by CT. Muscles and Tendons Intact and unremarkable. Soft tissues No acute abnormality is seen. Sigmoid diverticulosis and atherosclerosis are noted. IMPRESSION: Negative for fracture or other acute abnormality. Very mild degenerative disease about the left hip and SI joint. Degenerative disc disease L5-S1. Sigmoid diverticulosis. Atherosclerosis. Electronically Signed   By: Drusilla Kanner M.D.   On: 01/26/2017 13:03    Procedures Procedures (including critical care time)  Medications Ordered in ED Medications  sodium chloride 0.9 % bolus 1,000 mL (1,000 mLs Intravenous New Bag/Given 01/26/17 1216)     Initial Impression / Assessment  and Plan / ED Course  I have reviewed the triage vital signs and the nursing notes.  Pertinent labs & imaging results that were available during my care of the patient were reviewed by me and considered in my medical decision making (see chart for details).     71 y.o cva Dm, hypertension, presents with aki/renal failure.  AKI ?secondary  Abx, vs post obstructive vs volume depletion.  Plan foley  uti- pseudomonas Hyperkalemia- mild at 5.2, likely will resolve with iv hydration Repeat ct head and ct hip without acute fracture.  LEFT shoulder, knee and foot plain radiograph pending. Plan admission to medicine for further tx Dr. Darrick Penna saw in ED for nephrology.  D/w Dr. Katrinka Blazing and will see and admit Final Clinical Impressions(s) / ED Diagnoses   Final diagnoses:  AKI (acute kidney injury) (HCC)  Pain    New Prescriptions New Prescriptions   No medications on file     Margarita Grizzle, MD 01/26/17 [phone]

## 2017-01-27 LAB — RENAL FUNCTION PANEL
ANION GAP: 9 (ref 5–15)
Albumin: 2.6 g/dL — ABNORMAL LOW (ref 3.5–5.0)
BUN: 53 mg/dL — ABNORMAL HIGH (ref 6–20)
CALCIUM: 8.3 mg/dL — AB (ref 8.9–10.3)
CO2: 17 mmol/L — ABNORMAL LOW (ref 22–32)
CREATININE: 3.98 mg/dL — AB (ref 0.44–1.00)
Chloride: 112 mmol/L — ABNORMAL HIGH (ref 101–111)
GFR calc non Af Amer: 11 mL/min — ABNORMAL LOW (ref >60)
GFR, EST AFRICAN AMERICAN: 12 mL/min — AB (ref >60)
GLUCOSE: 97 mg/dL (ref 65–99)
Phosphorus: 4 mg/dL (ref 2.5–4.6)
Potassium: 4.3 mmol/L (ref 3.5–5.1)
SODIUM: 138 mmol/L (ref 135–145)

## 2017-01-27 LAB — URINE CULTURE

## 2017-01-27 LAB — GLUCOSE, CAPILLARY
GLUCOSE-CAPILLARY: 95 mg/dL (ref 65–99)
GLUCOSE-CAPILLARY: 105 mg/dL — AB (ref 65–99)
GLUCOSE-CAPILLARY: 110 mg/dL — AB (ref 65–99)
GLUCOSE-CAPILLARY: 128 mg/dL — AB (ref 65–99)
Glucose-Capillary: 105 mg/dL — ABNORMAL HIGH (ref 65–99)
Glucose-Capillary: 123 mg/dL — ABNORMAL HIGH (ref 65–99)

## 2017-01-27 LAB — CBC
HCT: 30.2 % — ABNORMAL LOW (ref 36.0–46.0)
HEMOGLOBIN: 9.4 g/dL — AB (ref 12.0–15.0)
MCH: 27.2 pg (ref 26.0–34.0)
MCHC: 31.1 g/dL (ref 30.0–36.0)
MCV: 87.5 fL (ref 78.0–100.0)
PLATELETS: 282 10*3/uL (ref 150–400)
RBC: 3.45 MIL/uL — ABNORMAL LOW (ref 3.87–5.11)
RDW: 17.1 % — ABNORMAL HIGH (ref 11.5–15.5)
WBC: 9.9 10*3/uL (ref 4.0–10.5)

## 2017-01-27 LAB — URINALYSIS, ROUTINE W REFLEX MICROSCOPIC
BILIRUBIN URINE: NEGATIVE
Glucose, UA: NEGATIVE mg/dL
Ketones, ur: NEGATIVE mg/dL
NITRITE: NEGATIVE
PH: 5 (ref 5.0–8.0)
Protein, ur: 30 mg/dL — AB
SPECIFIC GRAVITY, URINE: 1.011 (ref 1.005–1.030)
SQUAMOUS EPITHELIAL / LPF: NONE SEEN

## 2017-01-27 LAB — BLOOD GAS, ARTERIAL
Acid-base deficit: 7.8 mmol/L — ABNORMAL HIGH (ref 0.0–2.0)
Bicarbonate: 16.7 mmol/L — ABNORMAL LOW (ref 20.0–28.0)
DRAWN BY: 313061
FIO2: 21
O2 Saturation: 95.5 %
PATIENT TEMPERATURE: 98.6
PH ART: 7.351 (ref 7.350–7.450)
pCO2 arterial: 30.9 mmHg — ABNORMAL LOW (ref 32.0–48.0)
pO2, Arterial: 77.8 mmHg — ABNORMAL LOW (ref 83.0–108.0)

## 2017-01-27 LAB — AMMONIA: AMMONIA: 17 umol/L (ref 9–35)

## 2017-01-27 LAB — IRON AND TIBC
Iron: 52 ug/dL (ref 28–170)
SATURATION RATIOS: 23 % (ref 10.4–31.8)
TIBC: 223 ug/dL — ABNORMAL LOW (ref 250–450)
UIBC: 171 ug/dL

## 2017-01-27 MED ORDER — INSULIN ASPART 100 UNIT/ML ~~LOC~~ SOLN: 0.0000 [IU] | Freq: Three times a day (TID) | SUBCUTANEOUS | Status: DC

## 2017-01-27 NOTE — Progress Notes (Signed)
PROGRESS NOTE    Jamie Aguirre  ZOX:[id number] DOB: Sep 01, 1945 DOA: 01/26/2017 PCP: Myrlene Broker, MD     Brief Narrative:  Jamie Aguirre is a 71 y.o. 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 Patient was just recently admitted to the hospital in 10/2016 for CVA. Following the CVA patient noted weakness for which she was discharged to a nursing facility thereafter due to the inability to care for herself. Patient had just had a recent fall out of her wheelchair approximately 2 days ago for which x-rays reveal no acute fractures, she was sent back to facility from the ED. Patient recently diagnosed with urinary tract infection which was recent medications included vancomycin (but developed toxic vanclevel), then was switched to gentamicin, and lastly has been treated on 3 days of Levaquin, but reports allergy. Cultures were positive for pan sensitive pseudomoas. She is admitted due to acute kidney injury as well as urinary tract infection.  Assessment & Plan:   Active Problems:   Type 2 diabetes mellitus (HCC)   HTN (hypertension)   Hyperlipidemia   Stroke (HCC)   ARF (acute renal failure) (HCC)   Acute lower UTI   Hyperkalemia   Anxiety  Acute kidney injury, ATN vs AIN  -Appreciate nephrology -Avoid nephrotoxins -IV fluids -Trend BMP   Urinary tract infection, POA -Recent history for pansensitive pseudomonas -Urine culture with contaminant, repeat UA  -Blood culture pending  -Continue cefepime   Acute encephalopathy -? Unclear baseline, per her previous emergency department and inpatient hospital notes, it appears she may be alert but confused at baseline. Also history of dementia. 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 -Continue aspirin, Plavix  Hyperlipidemia  -Continue Lipitor  Seizure disorder -Continue depakote   Anxiety -Continue Ativan, BuSpar   DVT prophylaxis: Heparin subq Code Status: Full code Family Communication: Unable to reach any family over the phone today Disposition Plan: Discharge back to nursing facility once medically stable   Consultants:   Nephrology  Procedures:   None  Antimicrobials:  Anti-infectives    Start     Dose/Rate Route Frequency Ordered Stop   01/26/17 2000  ceFEPIme (MAXIPIME) 1 g in dextrose 5 % 50 mL IVPB     1 g 100 mL/hr over 30 Minutes Intravenous Every 24 hours 01/26/17 1900     01/26/17 1930  ceFEPIme (MAXIPIME) 1 g in dextrose 5 % 50 mL IVPB  Status:  Discontinued     1 g 100 mL/hr over 30 Minutes Intravenous  Once 01/26/17 1857 01/26/17 1900         Subjective: Lethargic, moaning. She arouses briefly to verbal and physical stimuli, but does not open her eyes. She is unable to answer any questions for me.   Objective: Vitals:   01/26/17 1759 01/26/17 2007 01/27/17 0545 01/27/17 0933  BP: 136/81 (!) 157/81 (!) 154/77 (!) 162/64  Pulse: 69 86 77 66  Resp: 17 16 16 16   Temp: 98 F (36.7 C) 97.5 F (36.4 C) 98.1 F (36.7 C) 97.8 F (36.6 C)  TempSrc: Oral Oral Oral Oral  SpO2: 98% 98% 97%  99%  Weight:  107.1 kg (236 lb 1.8 oz)    Height:        Intake/Output Summary (Last 24 hours) at 01/27/17 1406 Last data filed at 01/27/17 1000  Gross per 24 hour  Intake              450 ml  Output              886 ml  Net             -436 ml   Filed Weights   01/26/17 1111 01/26/17 2007  Weight: 106.6 kg (235 lb) 107.1 kg (236 lb 1.8 oz)    Examination:  General exam: Appears calm and comfortable, unable to arouse easily, very lethargic and  somnolent  Respiratory system: Clear to auscultation. Respiratory effort normal. Cardiovascular system: S1 & S2 heard, RRR. No JVD, murmurs, rubs, gallops or clicks. No pedal edema. Gastrointestinal system: Abdomen is nondistended, soft and nontender. No organomegaly or masses felt. Normal bowel sounds heard. Central nervous system: Unable to test  Extremities: Symmetric in appearance  Skin: No rashes, lesions or ulcers, +bruising over left forehead   Data Reviewed: I have personally reviewed following labs and imaging studies  CBC:  Recent Labs Lab 01/26/17 1127 01/27/17 0501  WBC 11.3* 9.9  NEUTROABS 6.9  --   HGB 10.8* 9.4*  HCT 34.5* 30.2*  MCV 88.0 87.5  PLT 314 282   Basic Metabolic Panel:  Recent Labs Lab 01/26/17 1127 01/27/17 0501  NA 137 138  K 5.2* 4.3  CL 108 112*  CO2 19* 17*  GLUCOSE 77 97  BUN 60* 53*  CREATININE 4.75* 3.98*  CALCIUM 9.2 8.3*  PHOS  --  4.0   GFR: Estimated Creatinine Clearance: 15.6 mL/min (A) (by C-G formula based on SCr of 3.98 mg/dL (H)). Liver Function Tests:  Recent Labs Lab 01/26/17 1127 01/27/17 0501  AST 22  --   ALT 13*  --   ALKPHOS 47  --   BILITOT 0.6  --   PROT 6.9  --   ALBUMIN 3.3* 2.6*   No results for input(s): LIPASE, AMYLASE in the last 168 hours. No results for input(s): AMMONIA in the last 168 hours. Coagulation Profile: No results for input(s): INR, PROTIME in the last 168 hours. Cardiac Enzymes: No results for input(s): CKTOTAL, CKMB, CKMBINDEX, TROPONINI in the last 168 hours. BNP (last 3 results) No results for input(s): PROBNP in the last 8760 hours. HbA1C: No results for input(s): HGBA1C in the last 72 hours. CBG:  Recent Labs Lab 01/26/17 2039 01/27/17 0001 01/27/17 0407 01/27/17 0806 01/27/17 1239  GLUCAP 95 99 95 105* 123*   Lipid Profile: No results for input(s): CHOL, HDL, LDLCALC, TRIG, CHOLHDL, LDLDIRECT in the last 72 hours. Thyroid Function Tests: No results for input(s):  TSH, T4TOTAL, FREET4, T3FREE, THYROIDAB in the last 72 hours. Anemia Panel:  Recent Labs  01/27/17 1159  TIBC 223*  IRON 52   Sepsis Labs: No results for input(s): PROCALCITON, LATICACIDVEN in the last 168 hours.  Recent Results (from the past 240 hour(s))  Blood culture (routine x 2)     Status: None (Preliminary result)   Collection Time: 01/26/17 11:30 AM  Result Value Ref Range Status   Specimen Description BLOOD RIGHT ANTECUBITAL  Final   Special Requests   Final    BOTTLES DRAWN AEROBIC AND ANAEROBIC Blood Culture adequate volume   Culture NO GROWTH < 24 HOURS  Final  Report Status PENDING  Incomplete  Blood culture (routine x 2)     Status: None (Preliminary result)   Collection Time: 01/26/17 11:38 AM  Result Value Ref Range Status   Specimen Description BLOOD RIGHT HAND  Final   Special Requests IN PEDIATRIC BOTTLE Blood Culture adequate volume  Final   Culture NO GROWTH < 24 HOURS  Final   Report Status PENDING  Incomplete  Urine Culture     Status: Abnormal   Collection Time: 01/26/17  4:15 PM  Result Value Ref Range Status   Specimen Description URINE, CATHETERIZED  Final   Special Requests NONE  Final   Culture MULTIPLE SPECIES PRESENT, SUGGEST RECOLLECTION (A)  Final   Report Status 01/27/2017 FINAL  Final  MRSA PCR Screening     Status: None   Collection Time: 01/26/17  5:30 PM  Result Value Ref Range Status   MRSA by PCR NEGATIVE NEGATIVE Final    Comment:        The GeneXpert MRSA Assay (FDA approved for NASAL specimens only), is one component of a comprehensive MRSA colonization surveillance program. It is not intended to diagnose MRSA infection nor to guide or monitor treatment for MRSA infections.        Radiology Studies: Dg Chest 1 View  Result Date: 01/26/2017 CLINICAL DATA:  Fall. EXAM: CHEST 1 VIEW COMPARISON:  Radiographs of Jan 06, 2015. FINDINGS: Stable cardiomediastinal silhouette. No pneumothorax or pleural effusion is noted. No  acute pulmonary disease is noted. Bony thorax is unremarkable. IMPRESSION: No acute cardiopulmonary abnormality seen. Electronically Signed   By: Lupita RaiderJames  Green Jr, M.D.   On: 01/26/2017 13:48   Dg Knee 1-2 Views Left  Result Date: 01/26/2017 CLINICAL DATA:  Left knee pain after fall. EXAM: LEFT KNEE - 1-2 VIEW COMPARISON:  None. FINDINGS: No evidence of fracture, dislocation, or joint effusion. Severe narrowing of medial joint space is noted. Osteophyte formation is noted laterally. Soft tissues are unremarkable. IMPRESSION: Severe degenerative joint disease. No acute abnormality seen in the left knee. Electronically Signed   By: Lupita RaiderJames  Green Jr, M.D.   On: 01/26/2017 13:49   Ct Head Wo Contrast  Result Date: 01/26/2017 CLINICAL DATA:  Fall with pain EXAM: CT HEAD WITHOUT CONTRAST TECHNIQUE: Contiguous axial images were obtained from the base of the skull through the vertex without intravenous contrast. COMPARISON:  January 22, 2017 FINDINGS: Brain: There is moderate diffuse atrophy. There is no intracranial mass, hemorrhage, extra-axial fluid collection, or midline shift. There is small vessel disease throughout the centra semiovale bilaterally. There is a prior small infarct in the left centrum semiovale. There is small vessel disease in each external capsule. There is a small prior infarct involving a portion of the head of the caudate nucleus on the right. There is a prior infarct in the medial pons-midbrain junction on the right. There is no acute infarct evident. Vascular: There is no appreciable hyperdense vessel. There is calcification in each carotid siphon. Skull: There is a left frontal scalp hematoma. No fracture evident. Bony calvarium appears intact. Sinuses/Orbits: There is mucosal thickening in several ethmoid air cells bilaterally. There is moderate opacification in the posterior sphenoid sinuses bilaterally, more on the right than on the left. Visualized paranasal sinuses elsewhere clear.  Visualized orbits appear symmetric bilaterally. Other: Mastoid air cells are clear. IMPRESSION: Atrophy with small vessel disease and prior infarcts as noted. No intracranial mass hemorrhage, or extra-axial fluid collection. No acute infarct evident. There are focal areas of vascular  calcification. There is a left frontal scalp hematoma without underlying fracture. There are areas of paranasal sinus disease. Electronically Signed   By: Bretta Bang III M.D.   On: 01/26/2017 13:00   US Renal  Result Date: 01/26/2017 CLINICAL DATA:  Acute kidney injury. EXAM: RENAL / URINARY TRACT ULTRASOUND COMPLETE COMPARISON:  04/23/2009 FINDINGS: Right Kidney: Length: 10.1 cm. Echogenicity within normal limits. No mass or hydronephrosis visualized. Left Kidney: Length: 9.7 cm. Echogenicity within normal limits. No mass or hydronephrosis visualized. Bladder: Appears normal for degree of bladder distention. IMPRESSION: Normal renal ultrasound. Electronically Signed   By: Paulina Fusi M.D.   On: 01/26/2017 14:02   Ct Hip Left Wo Contrast  Result Date: 01/26/2017 CLINICAL DATA:  Left hip pain due to a fall 01/22/2017. Initial encounter. EXAM: CT OF THE LEFT HIP WITHOUT CONTRAST TECHNIQUE: Multidetector CT imaging of the left hip was performed according to the standard protocol. Multiplanar CT image reconstructions were also generated. COMPARISON:  Plain films left hip 01/22/2017. FINDINGS: Bones/Joint/Cartilage The left hip is located. No fracture is identified. No focal bony lesion is seen. Mild degenerative change is present about the left SI joint and hip. Degenerative disc disease is partially visualized at L5-S1. Ligaments Suboptimally assessed by CT. Muscles and Tendons Intact and unremarkable. Soft tissues No acute abnormality is seen. Sigmoid diverticulosis and atherosclerosis are noted. IMPRESSION: Negative for fracture or other acute abnormality. Very mild degenerative disease about the left hip and SI joint.  Degenerative disc disease L5-S1. Sigmoid diverticulosis. Atherosclerosis. Electronically Signed   By: Drusilla Kanner M.D.   On: 01/26/2017 13:03   Dg Shoulder Left  Result Date: 01/26/2017 CLINICAL DATA:  Left shoulder pain after fall. EXAM: LEFT SHOULDER - 2+ VIEW COMPARISON:  Radiographs of January 22, 2017. FINDINGS: There is no evidence of fracture or dislocation. Moderate degenerative changes seen involving the left acromioclavicular joint. Soft tissues are unremarkable. IMPRESSION: Moderate degenerative joint disease of left acromioclavicular joint. No acute abnormality seen in the left shoulder. Electronically Signed   By: Lupita Raider, M.D.   On: 01/26/2017 13:43   Dg Foot 2 Views Left  Result Date: 01/26/2017 CLINICAL DATA:  Left foot pain after fall. EXAM: LEFT FOOT - 2 VIEW COMPARISON:  None. FINDINGS: There is no evidence of fracture or dislocation. There is no evidence of arthropathy or other focal bone abnormality. Soft tissues are unremarkable. IMPRESSION: No significant abnormality seen in the left foot. Electronically Signed   By: Lupita Raider, M.D.   On: 01/26/2017 13:41      Scheduled Meds: . aspirin  325 mg Oral Daily  . atorvastatin  40 mg Oral q1800  . busPIRone  15 mg Oral BID  . clopidogrel  75 mg Oral Q supper  . divalproex  250 mg Oral BID  . heparin  5,000 Units Subcutaneous Q8H  . insulin aspart  0-9 Units Subcutaneous TID WC  . insulin detemir  16 Units Subcutaneous BID  . lipase/protease/amylase  36,000 Units Oral TID AC  . metoprolol succinate  100 mg Oral BID  . mirtazapine  15 mg Oral QHS  . simethicone  160 mg Oral BID  . sodium bicarbonate  1,300 mg Oral TID  . traZODone  100 mg Oral QHS   Continuous Infusions: . sodium chloride 25 mL/hr at 01/27/17 1041  . ceFEPime (MAXIPIME) IV Stopped (01/26/17 2130)     LOS: 1 day    Time spent: 40 minutes   Noralee Stain, DO [facility] [email or web address] Password [email or web address] 01/27/2017, 2:06 PM

## 2017-01-27 NOTE — Progress Notes (Signed)
Subjective: Interval History: confused, somnolent,  Objective: Vital signs in last 24 hours: Temp:  [96.6 F (35.9 C)-98.1 F (36.7 C)] 98.1 F (36.7 C) (06/16 0545) Pulse Rate:  [69-86] 77 (06/16 0545) Resp:  [14-21] 16 (06/16 0545) BP: (136-189)/(77-146) 154/77 (06/16 0545) SpO2:  [96 %-100 %] 97 % (06/16 0545) Weight:  [106.6 kg (235 lb)-107.1 kg (236 lb 1.8 oz)] 107.1 kg (236 lb 1.8 oz) (06/15 2007) Weight change:   Intake/Output from previous day: 06/15 0701 - 06/16 0700 In: 450 [I.V.:400; IV Piggyback:50] Out: 586 [Urine:585; Stool:1] Intake/Output this shift: No intake/output data recorded.  General appearance: no distress, moderately obese, pale and not coop, moans, sleeping Resp: diminished breath sounds bilaterally and rhonchi bibasilar Cardio: S1, S2 normal and systolic murmur: holosystolic 2/6, blowing at apex GI: obese, pos bs Extremities: edema 2+  Lab Results:  Recent Labs  01/26/17 1127 01/27/17 0501  WBC 11.3* 9.9  HGB 10.8* 9.4*  HCT 34.5* 30.2*  PLT 314 282   BMET:  Recent Labs  01/26/17 1127 01/27/17 0501  NA 137 138  K 5.2* 4.3  CL 108 112*  CO2 19* 17*  GLUCOSE 77 97  BUN 60* 53*  CREATININE 4.75* 3.98*  CALCIUM 9.2 8.3*   No results for input(s): PTH in the last 72 hours. Iron Studies: No results for input(s): IRON, TIBC, TRANSFERRIN, FERRITIN in the last 72 hours.  Studies/Results: Dg Chest 1 View  Result Date: 01/26/2017 CLINICAL DATA:  Fall. EXAM: CHEST 1 VIEW COMPARISON:  Radiographs of Jan 06, 2015. FINDINGS: Stable cardiomediastinal silhouette. No pneumothorax or pleural effusion is noted. No acute pulmonary disease is noted. Bony thorax is unremarkable. IMPRESSION: No acute cardiopulmonary abnormality seen. Electronically Signed   By: Lupita Raider, M.D.   On: 01/26/2017 13:48   Dg Knee 1-2 Views Left  Result Date: 01/26/2017 CLINICAL DATA:  Left knee pain after fall. EXAM: LEFT KNEE - 1-2 VIEW COMPARISON:  None. FINDINGS:  No evidence of fracture, dislocation, or joint effusion. Severe narrowing of medial joint space is noted. Osteophyte formation is noted laterally. Soft tissues are unremarkable. IMPRESSION: Severe degenerative joint disease. No acute abnormality seen in the left knee. Electronically Signed   By: Lupita Raider, M.D.   On: 01/26/2017 13:49   Ct Head Wo Contrast  Result Date: 01/26/2017 CLINICAL DATA:  Fall with pain EXAM: CT HEAD WITHOUT CONTRAST TECHNIQUE: Contiguous axial images were obtained from the base of the skull through the vertex without intravenous contrast. COMPARISON:  January 22, 2017 FINDINGS: Brain: There is moderate diffuse atrophy. There is no intracranial mass, hemorrhage, extra-axial fluid collection, or midline shift. There is small vessel disease throughout the centra semiovale bilaterally. There is a prior small infarct in the left centrum semiovale. There is small vessel disease in each external capsule. There is a small prior infarct involving a portion of the head of the caudate nucleus on the right. There is a prior infarct in the medial pons-midbrain junction on the right. There is no acute infarct evident. Vascular: There is no appreciable hyperdense vessel. There is calcification in each carotid siphon. Skull: There is a left frontal scalp hematoma. No fracture evident. Bony calvarium appears intact. Sinuses/Orbits: There is mucosal thickening in several ethmoid air cells bilaterally. There is moderate opacification in the posterior sphenoid sinuses bilaterally, more on the right than on the left. Visualized paranasal sinuses elsewhere clear. Visualized orbits appear symmetric bilaterally. Other: Mastoid air cells are clear. IMPRESSION: Atrophy with small vessel  disease and prior infarcts as noted. No intracranial mass hemorrhage, or extra-axial fluid collection. No acute infarct evident. There are focal areas of vascular calcification. There is a left frontal scalp hematoma without  underlying fracture. There are areas of paranasal sinus disease. Electronically Signed   By: Bretta BangWilliam  Woodruff III M.D.   On: 01/26/2017 13:00   [place] Renal  Result Date: 01/26/2017 CLINICAL DATA:  Acute kidney injury. EXAM: RENAL / URINARY TRACT ULTRASOUND COMPLETE COMPARISON:  04/23/2009 FINDINGS: Right Kidney: Length: 10.1 cm. Echogenicity within normal limits. No mass or hydronephrosis visualized. Left Kidney: Length: 9.7 cm. Echogenicity within normal limits. No mass or hydronephrosis visualized. Bladder: Appears normal for degree of bladder distention. IMPRESSION: Normal renal ultrasound. Electronically Signed   By: Paulina FusiMark  Shogry M.D.   On: 01/26/2017 14:02   Ct Hip Left Wo Contrast  Result Date: 01/26/2017 CLINICAL DATA:  Left hip pain due to a fall 01/22/2017. Initial encounter. EXAM: CT OF THE LEFT HIP WITHOUT CONTRAST TECHNIQUE: Multidetector CT imaging of the left hip was performed according to the standard protocol. Multiplanar CT image reconstructions were also generated. COMPARISON:  Plain films left hip 01/22/2017. FINDINGS: Bones/Joint/Cartilage The left hip is located. No fracture is identified. No focal bony lesion is seen. Mild degenerative change is present about the left SI joint and hip. Degenerative disc disease is partially visualized at L5-S1. Ligaments Suboptimally assessed by CT. Muscles and Tendons Intact and unremarkable. Soft tissues No acute abnormality is seen. Sigmoid diverticulosis and atherosclerosis are noted. IMPRESSION: Negative for fracture or other acute abnormality. Very mild degenerative disease about the left hip and SI joint. Degenerative disc disease L5-S1. Sigmoid diverticulosis. Atherosclerosis. Electronically Signed   By: Drusilla Kannerhomas  Dalessio M.D.   On: 01/26/2017 13:03   Dg Shoulder Left  Result Date: 01/26/2017 CLINICAL DATA:  Left shoulder pain after fall. EXAM: LEFT SHOULDER - 2+ VIEW COMPARISON:  Radiographs of January 22, 2017. FINDINGS: There is no evidence of  fracture or dislocation. Moderate degenerative changes seen involving the left acromioclavicular joint. Soft tissues are unremarkable. IMPRESSION: Moderate degenerative joint disease of left acromioclavicular joint. No acute abnormality seen in the left shoulder. Electronically Signed   By: Lupita RaiderJames  Green Jr, M.D.   On: 01/26/2017 13:43   Dg Foot 2 Views Left  Result Date: 01/26/2017 CLINICAL DATA:  Left foot pain after fall. EXAM: LEFT FOOT - 2 VIEW COMPARISON:  None. FINDINGS: There is no evidence of fracture or dislocation. There is no evidence of arthropathy or other focal bone abnormality. Soft tissues are unremarkable. IMPRESSION: No significant abnormality seen in the left foot. Electronically Signed   By: Lupita RaiderJames  Green Jr, M.D.   On: 01/26/2017 13:41    I have reviewed the patient's current medications.  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 Backround of ARB.  Cr improving.  Still acidemic 2 UTI culture pending on Cefapine 3 CVA  4 DM controlled 5 OSA 6 confusion  ?? What is baseline 7 Obesity P check Fe, PTH, cont foley, AB, low vol ivf,  Avoid ARB/ACEI   LOS: 1 day   Kellar Westberg L 01/27/2017,9:58 AM

## 2017-01-28 LAB — RENAL FUNCTION PANEL
ALBUMIN: 2.5 g/dL — AB (ref 3.5–5.0)
Anion gap: 7 (ref 5–15)
BUN: 46 mg/dL — AB (ref 6–20)
CALCIUM: 8.4 mg/dL — AB (ref 8.9–10.3)
CO2: 20 mmol/L — AB (ref 22–32)
Chloride: 115 mmol/L — ABNORMAL HIGH (ref 101–111)
Creatinine, Ser: 3.57 mg/dL — ABNORMAL HIGH (ref 0.44–1.00)
GFR calc Af Amer: 14 mL/min — ABNORMAL LOW (ref >60)
GFR calc non Af Amer: 12 mL/min — ABNORMAL LOW (ref >60)
GLUCOSE: 79 mg/dL (ref 65–99)
PHOSPHORUS: 3.3 mg/dL (ref 2.5–4.6)
Potassium: 4.4 mmol/L (ref 3.5–5.1)
SODIUM: 142 mmol/L (ref 135–145)

## 2017-01-28 LAB — CBC WITH DIFFERENTIAL/PLATELET
BASOS ABS: 0 10*3/uL (ref 0.0–0.1)
BASOS PCT: 0 %
EOS PCT: 16 %
Eosinophils Absolute: 1.4 10*3/uL — ABNORMAL HIGH (ref 0.0–0.7)
HEMATOCRIT: 30.9 % — AB (ref 36.0–46.0)
Hemoglobin: 9.6 g/dL — ABNORMAL LOW (ref 12.0–15.0)
Lymphocytes Relative: 13 %
Lymphs Abs: 1.1 10*3/uL (ref 0.7–4.0)
MCH: 27.3 pg (ref 26.0–34.0)
MCHC: 31.1 g/dL (ref 30.0–36.0)
MCV: 87.8 fL (ref 78.0–100.0)
MONO ABS: 1.3 10*3/uL — AB (ref 0.1–1.0)
MONOS PCT: 15 %
Neutro Abs: 4.7 10*3/uL (ref 1.7–7.7)
Neutrophils Relative %: 56 %
PLATELETS: 257 10*3/uL (ref 150–400)
RBC: 3.52 MIL/uL — ABNORMAL LOW (ref 3.87–5.11)
RDW: 17.2 % — AB (ref 11.5–15.5)
WBC: 8.5 10*3/uL (ref 4.0–10.5)

## 2017-01-28 LAB — URINE CULTURE: CULTURE: NO GROWTH

## 2017-01-28 LAB — GLUCOSE, CAPILLARY
GLUCOSE-CAPILLARY: 89 mg/dL (ref 65–99)
GLUCOSE-CAPILLARY: 91 mg/dL (ref 65–99)
GLUCOSE-CAPILLARY: 127 mg/dL — AB (ref 65–99)
GLUCOSE-CAPILLARY: 109 mg/dL — AB (ref 65–99)
Glucose-Capillary: 129 mg/dL — ABNORMAL HIGH (ref 65–99)

## 2017-01-28 MED ORDER — FERUMOXYTOL INJECTION 510 MG/17 ML
510.0000 mg | Freq: Once | INTRAVENOUS | Status: AC
  Administered 2017-01-28: 510 mg via INTRAVENOUS
  Filled 2017-01-28: qty 17

## 2017-01-28 NOTE — Progress Notes (Addendum)
PROGRESS NOTE    [name]  KGM:[id number] DOB: 1945/08/17 DOA: 01/26/2017 PCP: Myrlene Broker, MD     Brief Narrative:  Jamie Aguirre is a 71 y.o. 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 Patient was just recently admitted to the hospital in 10/2016 for CVA. Following the CVA patient noted weakness for which she was discharged to a nursing facility thereafter due to the inability to care for herself. Patient had just had a recent fall out of her wheelchair approximately 2 days ago for which x-rays reveal no acute fractures, she was sent back to facility from the ED. Patient recently diagnosed with urinary tract infection which was recent medications included vancomycin (but developed toxic vanclevel), then was switched to gentamicin, and lastly has been treated on 3 days of Levaquin, but reports allergy. Cultures were positive for pan sensitive pseudomoas. She is admitted due to acute kidney injury as well as urinary tract infection.  Assessment & Plan:   Active Problems:   Type 2 diabetes mellitus (HCC)   HTN (hypertension)   Hyperlipidemia   Stroke (HCC)   ARF (acute renal failure) (HCC)   Acute lower UTI   Hyperkalemia   Anxiety  Acute kidney injury, ATN vs AIN  -Appreciate nephrology -Avoid nephrotoxins -IV fluids -Trend BMP  -Improving  Pyuria  -Recent history for pansensitive pseudomonas -Urine culture with contaminant -Blood culture negative to date -Repeat urine culture negative, stop antibiotics   Acute encephalopathy -I spoke with her friend from church today. She tells me that patient has no family aside from a distant cousin who is uninvolved with patient's life. One of the other church member is her healthcare power of attorney (in chart). Multiple church members  care for this patient. She states that patient was diagnosed with Asperger syndrome years ago. Patient was independent, until her recent stroke. Since then, she has had expressive aphasia, left-sided weakness, not acting herself. At rehabilitation, patient has been intermittently refusing medications, refusing food and water. She tells me that patient sometimes will not respond or answer questions, although if she has her hearing aids in, she is able to understand you. Sometimes she will talk nonsensically and sometimes she will make sense. We spoke at length regarding patient's refusal to take her medications or eat, her quality of life. She believes that palliative care conversation, comfort care, hospice conversation would be beneficial for patient. Palliative care consulted for goals of care  Diabetes mellitus type II -Levemir, sliding scale insulin  Essential hypertension -Continue metoprolol  Recent history of CVA with residual dysarthria and expressive aphasia -Continue aspirin, Plavix  Hyperlipidemia  -Continue Lipitor  Seizure disorder -Continue depakote   Anxiety -Continue Ativan, BuSpar   DVT prophylaxis: Heparin subq Code Status: Full code Family Communication: Spoke at length with friend today, who is a caregiver. She states that the healthcare power of attorney, who is listed on the chart is out of town until tomorrow. Disposition Plan: Discharge back to nursing facility once stable   Consultants:   Nephrology  Palliative care  Procedures:   None  Antimicrobials:  Anti-infectives    Start     Dose/Rate Route Frequency Ordered Stop   01/26/17 2000  ceFEPIme (MAXIPIME) 1 g in dextrose 5 % 50 mL IVPB     1 g 100 mL/hr over 30 Minutes Intravenous  Every 24 hours 01/26/17 1900     01/26/17 1930  ceFEPIme (MAXIPIME) 1 g in dextrose 5 % 50 mL IVPB  Status:  Discontinued     1 g 100 mL/hr over 30 Minutes Intravenous  Once 01/26/17 1857 01/26/17 1900          Subjective: She is more arousable than she was yesterday. She does not answer any questions for me   Objective: Vitals:   01/27/17 1730 01/27/17 2033 01/28/17 0448 01/28/17 0909  BP: (!) 156/72 (!) 160/73 (!) 155/65 (!) 146/69  Pulse: 65 72 71 68  Resp: 20 19 17 18   Temp: 97.4 F (36.3 C) 97.4 F (36.3 C) 98.2 F (36.8 C) 98.5 F (36.9 C)  TempSrc: Oral Oral Oral Oral  SpO2: 100% 99% 98% 98%  Weight:  92.1 kg (203 lb)    Height:        Intake/Output Summary (Last 24 hours) at 01/28/17 1434 Last data filed at 01/28/17 0900  Gross per 24 hour  Intake              445 ml  Output              850 ml  Net             -405 ml   Filed Weights   01/26/17 1111 01/26/17 2007 01/27/17 2033  Weight: 106.6 kg (235 lb) 107.1 kg (236 lb 1.8 oz) 92.1 kg (203 lb)    Examination:  General exam: Appears calm and comfortable, unable to arouse easily, very lethargic although more arousable than yesterday  Respiratory system: Clear to auscultation. Respiratory effort normal. Cardiovascular system: S1 & S2 heard, RRR. No JVD, murmurs, rubs, gallops or clicks. No pedal edema. Gastrointestinal system: Abdomen is nondistended, soft and nontender. No organomegaly or masses felt. Normal bowel sounds heard. Central nervous system: Unable to test  Extremities: Symmetric in appearance  Skin: No rashes, lesions or ulcers, +bruising over left forehead   Data Reviewed: I have personally reviewed following labs and imaging studies  CBC:  Recent Labs Lab 01/26/17 1127 01/27/17 0501 01/28/17 0902  WBC 11.3* 9.9 8.5  NEUTROABS 6.9  --  4.7  HGB 10.8* 9.4* 9.6*  HCT 34.5* 30.2* 30.9*  MCV 88.0 87.5 87.8  PLT 314 282 257   Basic Metabolic Panel:  Recent Labs Lab 01/26/17 1127 01/27/17 0501 01/28/17 0902  NA 137 138 142  K 5.2* 4.3 4.4  CL 108 112* 115*  CO2 19* 17* 20*  GLUCOSE 77 97 79  BUN 60* 53* 46*  CREATININE 4.75* 3.98* 3.57*  CALCIUM 9.2 8.3* 8.4*  PHOS  --  4.0  3.3   GFR: Estimated Creatinine Clearance: 16 mL/min (A) (by C-G formula based on SCr of 3.57 mg/dL (H)). Liver Function Tests:  Recent Labs Lab 01/26/17 1127 01/27/17 0501 01/28/17 0902  AST 22  --   --   ALT 13*  --   --   ALKPHOS 47  --   --   BILITOT 0.6  --   --   PROT 6.9  --   --   ALBUMIN 3.3* 2.6* 2.5*   No results for input(s): LIPASE, AMYLASE in the last 168 hours.  Recent Labs Lab 01/27/17 1626  AMMONIA 17   Coagulation Profile: No results for input(s): INR, PROTIME in the last 168 hours. Cardiac Enzymes: No results for input(s): CKTOTAL, CKMB, CKMBINDEX, TROPONINI in the last 168 hours. BNP (last 3 results) No results for  input(s): PROBNP in the last 8760 hours. HbA1C: No results for input(s): HGBA1C in the last 72 hours. CBG:  Recent Labs Lab 01/27/17 1622 01/27/17 2029 01/28/17 0002 01/28/17 0447 01/28/17 1306  GLUCAP 110* 128* 105* 89 91   Lipid Profile: No results for input(s): CHOL, HDL, LDLCALC, TRIG, CHOLHDL, LDLDIRECT in the last 72 hours. Thyroid Function Tests: No results for input(s): TSH, T4TOTAL, FREET4, T3FREE, THYROIDAB in the last 72 hours. Anemia Panel:  Recent Labs  01/27/17 1159  TIBC 223*  IRON 52   Sepsis Labs: No results for input(s): PROCALCITON, LATICACIDVEN in the last 168 hours.  Recent Results (from the past 240 hour(s))  Blood culture (routine x 2)     Status: None (Preliminary result)   Collection Time: 01/26/17 11:30 AM  Result Value Ref Range Status   Specimen Description BLOOD RIGHT ANTECUBITAL  Final   Special Requests   Final    BOTTLES DRAWN AEROBIC AND ANAEROBIC Blood Culture adequate volume   Culture NO GROWTH 2 DAYS  Final   Report Status PENDING  Incomplete  Blood culture (routine x 2)     Status: None (Preliminary result)   Collection Time: 01/26/17 11:38 AM  Result Value Ref Range Status   Specimen Description BLOOD RIGHT HAND  Final   Special Requests IN PEDIATRIC BOTTLE Blood Culture  adequate volume  Final   Culture NO GROWTH 2 DAYS  Final   Report Status PENDING  Incomplete  Urine Culture     Status: Abnormal   Collection Time: 01/26/17  4:15 PM  Result Value Ref Range Status   Specimen Description URINE, CATHETERIZED  Final   Special Requests NONE  Final   Culture MULTIPLE SPECIES PRESENT, SUGGEST RECOLLECTION (A)  Final   Report Status 01/27/2017 FINAL  Final  MRSA PCR Screening     Status: None   Collection Time: 01/26/17  5:30 PM  Result Value Ref Range Status   MRSA by PCR NEGATIVE NEGATIVE Final    Comment:        The GeneXpert MRSA Assay (FDA approved for NASAL specimens only), is one component of a comprehensive MRSA colonization surveillance program. It is not intended to diagnose MRSA infection nor to guide or monitor treatment for MRSA infections.        Radiology Studies: No results found.    Scheduled Meds: . aspirin  325 mg Oral Daily  . atorvastatin  40 mg Oral q1800  . busPIRone  15 mg Oral BID  . clopidogrel  75 mg Oral Q supper  . divalproex  250 mg Oral BID  . heparin  5,000 Units Subcutaneous Q8H  . insulin aspart  0-9 Units Subcutaneous TID WC  . insulin detemir  16 Units Subcutaneous BID  . lipase/protease/amylase  36,000 Units Oral TID AC  . metoprolol succinate  100 mg Oral BID  . mirtazapine  15 mg Oral QHS  . simethicone  160 mg Oral BID  . sodium bicarbonate  1,300 mg Oral TID  . traZODone  100 mg Oral QHS   Continuous Infusions: . sodium chloride 25 mL/hr at 01/28/17 1214  . ceFEPime (MAXIPIME) IV Stopped (01/27/17 2111)     LOS: 2 days    Time spent: 30 minutes   Noralee Stain, DO [facility] [email or web address] Password [facility] 01/28/2017, 2:34 PM

## 2017-01-28 NOTE — Progress Notes (Signed)
Subjective: Interval History: not able to answer questions. Diuresing, good urine vol.. Labs not drawn Yet??  Objective: Vital signs in last 24 hours: Temp:  [97.4 F (36.3 C)-98.2 F (36.8 C)] 98.2 F (36.8 C) (06/17 0448) Pulse Rate:  [65-72] 71 (06/17 0448) Resp:  [16-20] 17 (06/17 0448) BP: (155-162)/(64-73) 155/65 (06/17 0448) SpO2:  [98 %-100 %] 98 % (06/17 0448) Weight:  [92.1 kg (203 lb)] 92.1 kg (203 lb) (06/16 2033) Weight change: -14.515 kg (-32 lb)  Intake/Output from previous day: 06/16 0701 - 06/17 0700 In: 445 [P.O.:120; I.V.:275; IV Piggyback:50] Out: 1150 [Urine:1150] Intake/Output this shift: No intake/output data recorded.  General appearance: morbidly obese, pale, slowed mentation and does not answer ?, but obeys some commands,  Resp: diminished breath sounds bilaterally Cardio: S1, S2 normal and systolic murmur: holosystolic 2/6, decrescendo at 2nd left intercostal space GI: obese, pos bs, liver down 5 cm 2 + edemaremities: edema 2 + edema and facial bruising improving  Lab Results:  Recent Labs  01/26/17 1127 01/27/17 0501  WBC 11.3* 9.9  HGB 10.8* 9.4*  HCT 34.5* 30.2*  PLT 314 282   BMET:  Recent Labs  01/26/17 1127 01/27/17 0501  NA 137 138  K 5.2* 4.3  CL 108 112*  CO2 19* 17*  GLUCOSE 77 97  BUN 60* 53*  CREATININE 4.75* 3.98*  CALCIUM 9.2 8.3*   No results for input(s): PTH in the last 72 hours. Iron Studies:  Recent Labs  01/27/17 1159  IRON 52  TIBC 223*    Studies/Results: Dg Chest 1 View  Result Date: 01/26/2017 CLINICAL DATA:  Fall. EXAM: CHEST 1 VIEW COMPARISON:  Radiographs of Jan 06, 2015. FINDINGS: Stable cardiomediastinal silhouette. No pneumothorax or pleural effusion is noted. No acute pulmonary disease is noted. Bony thorax is unremarkable. IMPRESSION: No acute cardiopulmonary abnormality seen. Electronically Signed   By: Lupita Raider, M.D.   On: 01/26/2017 13:48   Dg Knee 1-2 Views Left  Result Date:  01/26/2017 CLINICAL DATA:  Left knee pain after fall. EXAM: LEFT KNEE - 1-2 VIEW COMPARISON:  None. FINDINGS: No evidence of fracture, dislocation, or joint effusion. Severe narrowing of medial joint space is noted. Osteophyte formation is noted laterally. Soft tissues are unremarkable. IMPRESSION: Severe degenerative joint disease. No acute abnormality seen in the left knee. Electronically Signed   By: Lupita Raider, M.D.   On: 01/26/2017 13:49   Ct Head Wo Contrast  Result Date: 01/26/2017 CLINICAL DATA:  Fall with pain EXAM: CT HEAD WITHOUT CONTRAST TECHNIQUE: Contiguous axial images were obtained from the base of the skull through the vertex without intravenous contrast. COMPARISON:  January 22, 2017 FINDINGS: Brain: There is moderate diffuse atrophy. There is no intracranial mass, hemorrhage, extra-axial fluid collection, or midline shift. There is small vessel disease throughout the centra semiovale bilaterally. There is a prior small infarct in the left centrum semiovale. There is small vessel disease in each external capsule. There is a small prior infarct involving a portion of the head of the caudate nucleus on the right. There is a prior infarct in the medial pons-midbrain junction on the right. There is no acute infarct evident. Vascular: There is no appreciable hyperdense vessel. There is calcification in each carotid siphon. Skull: There is a left frontal scalp hematoma. No fracture evident. Bony calvarium appears intact. Sinuses/Orbits: There is mucosal thickening in several ethmoid air cells bilaterally. There is moderate opacification in the posterior sphenoid sinuses bilaterally, more on the right  than on the left. Visualized paranasal sinuses elsewhere clear. Visualized orbits appear symmetric bilaterally. Other: Mastoid air cells are clear. IMPRESSION: Atrophy with small vessel disease and prior infarcts as noted. No intracranial mass hemorrhage, or extra-axial fluid collection. No acute  infarct evident. There are focal areas of vascular calcification. There is a left frontal scalp hematoma without underlying fracture. There are areas of paranasal sinus disease. Electronically Signed   By: Bretta BangWilliam  Woodruff III M.D.   On: 01/26/2017 13:00   [place] Renal  Result Date: 01/26/2017 CLINICAL DATA:  Acute kidney injury. EXAM: RENAL / URINARY TRACT ULTRASOUND COMPLETE COMPARISON:  04/23/2009 FINDINGS: Right Kidney: Length: 10.1 cm. Echogenicity within normal limits. No mass or hydronephrosis visualized. Left Kidney: Length: 9.7 cm. Echogenicity within normal limits. No mass or hydronephrosis visualized. Bladder: Appears normal for degree of bladder distention. IMPRESSION: Normal renal ultrasound. Electronically Signed   By: Paulina FusiMark  Shogry M.D.   On: 01/26/2017 14:02   Ct Hip Left Wo Contrast  Result Date: 01/26/2017 CLINICAL DATA:  Left hip pain due to a fall 01/22/2017. Initial encounter. EXAM: CT OF THE LEFT HIP WITHOUT CONTRAST TECHNIQUE: Multidetector CT imaging of the left hip was performed according to the standard protocol. Multiplanar CT image reconstructions were also generated. COMPARISON:  Plain films left hip 01/22/2017. FINDINGS: Bones/Joint/Cartilage The left hip is located. No fracture is identified. No focal bony lesion is seen. Mild degenerative change is present about the left SI joint and hip. Degenerative disc disease is partially visualized at L5-S1. Ligaments Suboptimally assessed by CT. Muscles and Tendons Intact and unremarkable. Soft tissues No acute abnormality is seen. Sigmoid diverticulosis and atherosclerosis are noted. IMPRESSION: Negative for fracture or other acute abnormality. Very mild degenerative disease about the left hip and SI joint. Degenerative disc disease L5-S1. Sigmoid diverticulosis. Atherosclerosis. Electronically Signed   By: Drusilla Kannerhomas  Dalessio M.D.   On: 01/26/2017 13:03   Dg Shoulder Left  Result Date: 01/26/2017 CLINICAL DATA:  Left shoulder pain after  fall. EXAM: LEFT SHOULDER - 2+ VIEW COMPARISON:  Radiographs of January 22, 2017. FINDINGS: There is no evidence of fracture or dislocation. Moderate degenerative changes seen involving the left acromioclavicular joint. Soft tissues are unremarkable. IMPRESSION: Moderate degenerative joint disease of left acromioclavicular joint. No acute abnormality seen in the left shoulder. Electronically Signed   By: Lupita RaiderJames  Green Jr, M.D.   On: 01/26/2017 13:43   Dg Foot 2 Views Left  Result Date: 01/26/2017 CLINICAL DATA:  Left foot pain after fall. EXAM: LEFT FOOT - 2 VIEW COMPARISON:  None. FINDINGS: There is no evidence of fracture or dislocation. There is no evidence of arthropathy or other focal bone abnormality. Soft tissues are unremarkable. IMPRESSION: No significant abnormality seen in the left foot. Electronically Signed   By: Lupita RaiderJames  Green Jr, M.D.   On: 01/26/2017 13:41    I have reviewed the patient's current medications.  Assessment/Plan: 1 AKI toxic ATN, making urine.  AIN vs tubular toxicity. 2 UTI on Maxepine 3 CVA 4 Confusion 5 Obesity 6 CM 7 HTN not an issue, will use amlod if needed P check chem, follow clinically, cont AB    LOS: 2 days   Nimesh Riolo L 01/28/2017,9:03 AM

## 2017-01-28 NOTE — Progress Notes (Signed)
I spent 30 minutes in the patient's room attempting to give her medications. I was told in report that the patient needed coaxing to take her pills. I tried giving them with ensure, water, and pudding. The patient had been very drowsy today, but was very awake tonight while I was attempting to give her meds. The patient's eyes were open, she was looking at me. After multiple attempts with no success, I asked her multiple questions: How would you like to take your medications? Why won't you take your medication? What can I do to make taking your medications better? I did not receive a response to any of the questions. Finally I asked if she could hear me and the patient nodded her head. I repeated all previous questions with no response again. I paged the doctor to notify him of the medications she did not take (including depakote). I set up oxygen and suction in the room in case they are needed. Will continue to monitor.

## 2017-01-28 NOTE — Plan of Care (Signed)
Problem: Safety: Goal: Ability to remain free from injury will improve Outcome: Progressing No incidence of falls during this admission. Call bell within reach. Bed in low and locked position. Clean and clear environment maintained. Low bed and floor mats in use. Bed alarm armed. Nonskid footwear being maintained. 3/4 siderails in use.  Problem: Pain Managment: Goal: General experience of comfort will improve Outcome: Progressing Patient has no complaints of pain. Vital signs are stable. Patient resting well in bed.

## 2017-01-29 DIAGNOSIS — N179 Acute kidney failure, unspecified: Secondary | ICD-10-CM

## 2017-01-29 LAB — CBC WITH DIFFERENTIAL/PLATELET
BASOS PCT: 1 %
Basophils Absolute: 0 10*3/uL (ref 0.0–0.1)
EOS ABS: 1 10*3/uL — AB (ref 0.0–0.7)
EOS PCT: 13 %
HEMATOCRIT: 29 % — AB (ref 36.0–46.0)
Hemoglobin: 9.1 g/dL — ABNORMAL LOW (ref 12.0–15.0)
Lymphocytes Relative: 15 %
Lymphs Abs: 1.1 10*3/uL (ref 0.7–4.0)
MCH: 27.7 pg (ref 26.0–34.0)
MCHC: 31.4 g/dL (ref 30.0–36.0)
MCV: 88.1 fL (ref 78.0–100.0)
MONO ABS: 1.3 10*3/uL — AB (ref 0.1–1.0)
MONOS PCT: 17 %
Neutro Abs: 4.2 10*3/uL (ref 1.7–7.7)
Neutrophils Relative %: 54 %
PLATELETS: 264 10*3/uL (ref 150–400)
RBC: 3.29 MIL/uL — ABNORMAL LOW (ref 3.87–5.11)
RDW: 17.3 % — AB (ref 11.5–15.5)
WBC: 7.6 10*3/uL (ref 4.0–10.5)

## 2017-01-29 LAB — RENAL FUNCTION PANEL
ALBUMIN: 2.4 g/dL — AB (ref 3.5–5.0)
Anion gap: 8 (ref 5–15)
BUN: 40 mg/dL — AB (ref 6–20)
CALCIUM: 8.4 mg/dL — AB (ref 8.9–10.3)
CO2: 23 mmol/L (ref 22–32)
CREATININE: 3.11 mg/dL — AB (ref 0.44–1.00)
Chloride: 109 mmol/L (ref 101–111)
GFR calc Af Amer: 17 mL/min — ABNORMAL LOW (ref >60)
GFR calc non Af Amer: 14 mL/min — ABNORMAL LOW (ref >60)
GLUCOSE: 112 mg/dL — AB (ref 65–99)
PHOSPHORUS: 3.1 mg/dL (ref 2.5–4.6)
Potassium: 3.7 mmol/L (ref 3.5–5.1)
SODIUM: 140 mmol/L (ref 135–145)

## 2017-01-29 LAB — GLUCOSE, CAPILLARY
GLUCOSE-CAPILLARY: 118 mg/dL — AB (ref 65–99)
GLUCOSE-CAPILLARY: 107 mg/dL — AB (ref 65–99)
Glucose-Capillary: 118 mg/dL — ABNORMAL HIGH (ref 65–99)
Glucose-Capillary: 118 mg/dL — ABNORMAL HIGH (ref 65–99)
Glucose-Capillary: 160 mg/dL — ABNORMAL HIGH (ref 65–99)

## 2017-01-29 MED ORDER — SODIUM BICARBONATE 650 MG PO TABS
1300.0000 mg | ORAL_TABLET | Freq: Every day | ORAL | Status: DC
  Administered 2017-01-30 – 2017-01-31 (×2): 1300 mg via ORAL
  Filled 2017-01-29 (×2): qty 2

## 2017-01-29 NOTE — Progress Notes (Signed)
   01/29/17 0900  Clinical Encounter Type  Visited With Patient and family together  Visit Type Initial;Spiritual support;Social support;Other (Comment) Darnelle Maffucci is patient's hcpoa and paperwork is scanned in [employer])  Referral From Nurse;Palliative care team  Consult/Referral To Chaplain  Spiritual Encounters  Spiritual Needs Prayer;Emotional;Grief support  Stress Factors  Patient Stress Factors Exhausted;Family relationships;Health changes;Lack of knowledge  Advance Directives (For Healthcare)  Does Patient Have a Medical Advance Directive? Yes (In chart)   Chaplain responded to [place] consult. Met with patient and family. Provided emotional support and ministry of presence. Camora Tremain L. Volanda Napoleon, MDiv

## 2017-01-29 NOTE — Progress Notes (Signed)
PROGRESS NOTE    Jamie KaufmannLinda Aguirre  UJW:[id number] DOB: 25-Dec-1945 DOA: 01/26/2017 PCP: Myrlene Brokerrawford, Elizabeth A, MD     Brief Narrative:  71 y.o. 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  Patient was just recently admitted to the hospital in 10/2016 for CVA.  Following the CVA patient noted weakness for which she was discharged to a nursing facility thereafter due to the inability to care for herself.  Patient had just had a recent fall out of her wheelchair approximately 2 days ago for which x-rays reveal no acute fractures, she was sent back to facility from the ED.  Patient recently diagnosed with urinary tract infection which was recent medications included vancomycin (but developed toxic vanclevel), then was switched to gentamicin, and lastly has been treated on 3 days of Levaquin, but reports allergy.  Cultures were positive for pan sensitive pseudomoas. She is admitted due to acute kidney injury as well as urinary tract infection.  Assessment & Plan:   Active Problems:   Type 2 diabetes mellitus (HCC)   HTN (hypertension)   Hyperlipidemia   Stroke (HCC)   ARF (acute renal failure) (HCC)   Acute lower UTI   Hyperkalemia   Anxiety  Acute kidney injury, ATN vs AIN creatinine on admission 4.7, baseline 1.3 Initial renal acidosis of AKI now improving Appreciate nephrology input Creatinine now trending in the 3 range, continue saline 25 cc per hour Cut back sodium bicarbonate 1.3 g 3 times a day-->daily   Pyuria  -Recent history for pansensitive pseudomonas -Urine culture with contaminant -Blood culture negative to date -Repeat urine culture negative, stop antibiotics   Acute encephalopathy -Dr. Alvino Chapelhoi did d/c with her friend from church .  She tells me that patient has no family aside from a distant cousin who is uninvolved  with patient's life.  Patient was independent, until her recent stroke.  She believes that palliative care conversation, comfort care, hospice conversation would be beneficial for patient. 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 -Continue aspirin, Plavix  Hyperlipidemia  -Continue Lipitor  Seizure disorder -Continue depakote   Anxiety -Continue Ativan, BuSpar   DVT prophylaxis: Heparin subq Code Status: Full code Family Communication: no one bedsdie-called Disposition Plan: Discharge back to nursing facility once stable   Consultants:   Nephrology  Palliative care  Procedures:   None  Antimicrobials:  Anti-infectives    Start     Dose/Rate Route Frequency Ordered Stop   01/26/17 2000  ceFEPIme (MAXIPIME) 1 g in dextrose 5 % 50 mL IVPB  Status:  Discontinued     1 g 100 mL/hr over 30 Minutes Intravenous Every 24 hours 01/26/17 1900 01/28/17 1602   01/26/17 1930  ceFEPIme (MAXIPIME) 1 g in dextrose 5 % 50 mL IVPB  Status:  Discontinued     1 g 100 mL/hr over 30 Minutes Intravenous  Once 01/26/17 1857 01/26/17 1900         Subjective:  [facility] other real c/o Eating some  Objective: Vitals:   01/28/17 0448 01/28/17 0909 01/29/17 0407 01/29/17 1000  BP: Marland Kitchen(!)  155/65 (!) 146/69 139/68 (!) 160/120  Pulse: 71 68 71 70  Resp: 17 18 16 16   Temp: 98.2 F (36.8 C) 98.5 F (36.9 C) 98.2 F (36.8 C) 97.9 F (36.6 C)  TempSrc: Oral Oral Oral Oral  SpO2: 98% 98% 99% 97%  Weight:      Height:        Intake/Output Summary (Last 24 hours) at 01/29/17 1529 Last data filed at 01/29/17 1400  Gross per 24 hour  Intake             1105 ml  Output              950 ml  Net               155 ml   Filed Weights   01/26/17 1111 01/26/17 2007 01/27/17 2033  Weight: 106.6 kg (235 lb) 107.1 kg (236 lb 1.8 oz) 92.1 kg (203 lb)    Examination:  Patient awake alert but not oriented S1-S2 no murmur or gallop Chest clinically clear Abdomen soft nontender nondistended however obese Dense hemiparesis left upper extremity with grade 0/5 power reflexes brisk Right lower extremity flicker of movement 1/5 power Skin: No rashes, lesions or ulcers, +bruising over left forehead   Data Reviewed: I have personally reviewed following labs and imaging studies  CBC:  Recent Labs Lab 01/26/17 1127 01/27/17 0501 01/28/17 0902 01/29/17 0609  WBC 11.3* 9.9 8.5 7.6  NEUTROABS 6.9  --  4.7 4.2  HGB 10.8* 9.4* 9.6* 9.1*  HCT 34.5* 30.2* 30.9* 29.0*  MCV 88.0 87.5 87.8 88.1  PLT 314 282 257 264   Basic Metabolic Panel:  Recent Labs Lab 01/26/17 1127 01/27/17 0501 01/28/17 0902 01/29/17 0609  NA 137 138 142 140  K 5.2* 4.3 4.4 3.7  CL 108 112* 115* 109  CO2 19* 17* 20* 23  GLUCOSE 77 97 79 112*  BUN 60* 53* 46* 40*  CREATININE 4.75* 3.98* 3.57* 3.11*  CALCIUM 9.2 8.3* 8.4* 8.4*  PHOS  --  4.0 3.3 3.1   GFR: Estimated Creatinine Clearance: 18.4 mL/min (A) (by C-G formula based on SCr of 3.11 mg/dL (H)). Liver Function Tests:  Recent Labs Lab 01/26/17 1127 01/27/17 0501 01/28/17 0902 01/29/17 0609  AST 22  --   --   --   ALT 13*  --   --   --   ALKPHOS 47  --   --   --   BILITOT 0.6  --   --   --   PROT 6.9  --   --   --   ALBUMIN 3.3* 2.6* 2.5* 2.4*   No results for input(s): LIPASE, AMYLASE in the last 168 hours.  Recent Labs Lab 01/27/17 1626  AMMONIA 17   Coagulation Profile: No results for input(s): INR, PROTIME in the last 168 hours. Cardiac Enzymes: No results for input(s): CKTOTAL, CKMB, CKMBINDEX, TROPONINI in the last 168 hours. BNP (last 3 results) No results for input(s): PROBNP in the last 8760 hours. HbA1C: No results for input(s): HGBA1C  in the last 72 hours. CBG:  Recent Labs Lab 01/28/17 2017 01/28/17 2352 01/29/17 0407 01/29/17 0758 01/29/17 1142  GLUCAP 109* 129* 118* 107* 118*   Lipid Profile: No results for input(s): CHOL, HDL, LDLCALC, TRIG, CHOLHDL, LDLDIRECT in the last 72 hours. Thyroid Function Tests: No results for input(s): TSH, T4TOTAL, FREET4, T3FREE, THYROIDAB in the last 72 hours. Anemia Panel:  Recent Labs  01/27/17 1159  TIBC 223*  IRON 52   Sepsis Labs: No results for input(s): PROCALCITON, LATICACIDVEN in the last 168 hours.  Recent Results (from the past 240 hour(s))  Blood culture (routine x 2)     Status: None (Preliminary result)   Collection Time: 01/26/17 11:30 AM  Result Value Ref Range Status   Specimen Description BLOOD RIGHT ANTECUBITAL  Final   Special Requests   Final    BOTTLES DRAWN AEROBIC AND ANAEROBIC Blood Culture adequate volume   Culture NO GROWTH 2 DAYS  Final   Report Status PENDING  Incomplete  Blood culture (routine x 2)     Status: None (Preliminary result)   Collection Time: 01/26/17 11:38 AM  Result Value Ref Range Status   Specimen Description BLOOD RIGHT HAND  Final   Special Requests IN PEDIATRIC BOTTLE Blood Culture adequate volume  Final   Culture NO GROWTH 2 DAYS  Final   Report Status PENDING  Incomplete  Urine Culture     Status: Abnormal   Collection Time: 01/26/17  4:15 PM  Result Value Ref Range Status   Specimen Description URINE, CATHETERIZED  Final   Special Requests NONE  Final   Culture MULTIPLE SPECIES PRESENT, SUGGEST RECOLLECTION (A)  Final   Report Status 01/27/2017 FINAL  Final  MRSA PCR Screening     Status: None   Collection Time: 01/26/17  5:30 PM  Result Value Ref Range Status   MRSA by PCR NEGATIVE NEGATIVE Final    Comment:        The GeneXpert MRSA Assay (FDA approved for NASAL specimens only), is one component of a comprehensive MRSA colonization surveillance program. It is not intended to diagnose MRSA infection  nor to guide or monitor treatment for MRSA infections.   Culture, Urine     Status: None   Collection Time: 01/27/17  5:35 PM  Result Value Ref Range Status   Specimen Description URINE, CATHETERIZED  Final   Special Requests NONE  Final   Culture NO GROWTH  Final   Report Status 01/28/2017 FINAL  Final       Radiology Studies: No results found.    Scheduled Meds: . aspirin  325 mg Oral Daily  . atorvastatin  40 mg Oral q1800  . busPIRone  15 mg Oral BID  . clopidogrel  75 mg Oral Q supper  . divalproex  250 mg Oral BID  . heparin  5,000 Units Subcutaneous Q8H  . insulin aspart  0-9 Units Subcutaneous TID WC  . insulin detemir  16 Units Subcutaneous BID  . lipase/protease/amylase  36,000 Units Oral TID AC  . metoprolol succinate  100 mg Oral BID  . mirtazapine  15 mg Oral QHS  . simethicone  160 mg Oral BID  . sodium bicarbonate  1,300 mg Oral TID  . traZODone  100 mg Oral QHS   Continuous Infusions: . sodium chloride 25 mL/hr at 01/28/17 1214     LOS: 3 days    Time-15  Pleas Koch, MD [facility] [phone]

## 2017-01-29 NOTE — Progress Notes (Signed)
Subjective: Interval History: not able to answer questions.  Creatinine improving.  Had a long conversation with Antonieta PertHCPOA [name] today.  He is looking forward to pall care discussion  Objective: Vital signs in last 24 hours: Temp:  [97.9 F (36.6 C)-98.2 F (36.8 C)] 97.9 F (36.6 C) (06/18 1000) Pulse Rate:  [70-71] 70 (06/18 1000) Resp:  [16] 16 (06/18 1000) BP: (139-160)/(68-120) 160/120 (06/18 1000) SpO2:  [97 %-99 %] 97 % (06/18 1000) Weight change:   Intake/Output from previous day: 06/17 0701 - 06/18 0700 In: 625 [I.V.:625] Out: 250 [Urine:250] Intake/Output this shift: Total I/O In: 240 [P.O.:240] Out: 450 [Urine:450]  GEN: elderly and frail woman, NAD HEENT + L eye facial bruising, improved NECK no JVD PULM clear anteriorly CV RRR ABD soft, mild point tenderness LLQ, no rebound or guarding EXT 1+ LE edema NEURO: not able to answer questions consistently MSK no effusions  Lab Results:  Recent Labs  01/28/17 0902 01/29/17 0609  WBC 8.5 7.6  HGB 9.6* 9.1*  HCT 30.9* 29.0*  PLT 257 264   BMET:   Recent Labs  01/28/17 0902 01/29/17 0609  NA 142 140  K 4.4 3.7  CL 115* 109  CO2 20* 23  GLUCOSE 79 112*  BUN 46* 40*  CREATININE 3.57* 3.11*  CALCIUM 8.4* 8.4*   No results for input(s): PTH in the last 72 hours. Iron Studies:   Recent Labs  01/27/17 1159  IRON 52  TIBC 223*    Studies/Results: No results found.  I have reviewed the patient's current medications.  Assessment/Plan: 1 AKI toxic ATN, making urine, improving.  She is not a dialysis candidate and Feliz Beamravis and I have discussed this today.  He is in agreement that dialysis would not augment her QOL.   2 UTI- s/p course of cefepime for pseudomonas 3 CVA- L sided deficits 4 Confusion 5 Obesity 6 CM 7 HTN not an issue, will use amlod if needed    LOS: 3 days   Britley Gashi 01/29/2017,12:48 PM

## 2017-01-29 NOTE — Progress Notes (Signed)
Palliative Medicine consult noted. Due to high referral volume, there may be a delay seeing this patient. Please call the Palliative Medicine Team office at [phone] if recommendations are needed in the interim.  Thank you for inviting us to see this patient.  Margret ChanceMelanie G. Elice Crigger, RN, BSN, [facility] 01/29/2017 10:50 AM Cell [phone] 8:00-4:00 Monday-Friday Office [phone]

## 2017-01-29 NOTE — Plan of Care (Signed)
Problem: Safety: Goal: Ability to remain free from injury will improve Outcome: Progressing Pt has remained free of falls during this admission. Call bell within reach. Bed in low and locked position. Clean and clear environment maintained. Nonskid footwear being utilized. 3/4 siderails in place. Patient verbalized understanding of safety instruction.  Problem: Pain Managment: Goal: General experience of comfort will improve Outcome: Progressing Patient denies pain at this time. Vital signs are stable. No facial grimacing or moaning evident at this time.

## 2017-01-30 DIAGNOSIS — N390 Urinary tract infection, site not specified: Secondary | ICD-10-CM

## 2017-01-30 DIAGNOSIS — I63311 Cerebral infarction due to thrombosis of right middle cerebral artery: Secondary | ICD-10-CM

## 2017-01-30 DIAGNOSIS — Z7189 Other specified counseling: Secondary | ICD-10-CM

## 2017-01-30 DIAGNOSIS — N179 Acute kidney failure, unspecified: Secondary | ICD-10-CM

## 2017-01-30 DIAGNOSIS — Z515 Encounter for palliative care: Secondary | ICD-10-CM

## 2017-01-30 LAB — BASIC METABOLIC PANEL
Anion gap: 8 (ref 5–15)
BUN: 36 mg/dL — AB (ref 6–20)
CALCIUM: 8.9 mg/dL (ref 8.9–10.3)
CO2: 25 mmol/L (ref 22–32)
CREATININE: 2.97 mg/dL — AB (ref 0.44–1.00)
Chloride: 106 mmol/L (ref 101–111)
GFR calc Af Amer: 17 mL/min — ABNORMAL LOW (ref >60)
GFR, EST NON AFRICAN AMERICAN: 15 mL/min — AB (ref >60)
Glucose, Bld: 106 mg/dL — ABNORMAL HIGH (ref 65–99)
POTASSIUM: 3.8 mmol/L (ref 3.5–5.1)
Sodium: 139 mmol/L (ref 135–145)

## 2017-01-30 LAB — CBC WITH DIFFERENTIAL/PLATELET
Basophils Absolute: 0.1 10*3/uL (ref 0.0–0.1)
Basophils Relative: 1 %
EOS PCT: 11 %
Eosinophils Absolute: 1 10*3/uL — ABNORMAL HIGH (ref 0.0–0.7)
HCT: 31.1 % — ABNORMAL LOW (ref 36.0–46.0)
Hemoglobin: 9.7 g/dL — ABNORMAL LOW (ref 12.0–15.0)
LYMPHS ABS: 1.7 10*3/uL (ref 0.7–4.0)
LYMPHS PCT: 19 %
MCH: 27.2 pg (ref 26.0–34.0)
MCHC: 31.2 g/dL (ref 30.0–36.0)
MCV: 87.1 fL (ref 78.0–100.0)
MONOS PCT: 13 %
Monocytes Absolute: 1.2 10*3/uL — ABNORMAL HIGH (ref 0.1–1.0)
Neutro Abs: 5.2 10*3/uL (ref 1.7–7.7)
Neutrophils Relative %: 56 %
PLATELETS: 265 10*3/uL (ref 150–400)
RBC: 3.57 MIL/uL — ABNORMAL LOW (ref 3.87–5.11)
RDW: 16.7 % — AB (ref 11.5–15.5)
WBC: 9.2 10*3/uL (ref 4.0–10.5)

## 2017-01-30 LAB — RENAL FUNCTION PANEL
Albumin: 2.7 g/dL — ABNORMAL LOW (ref 3.5–5.0)
Anion gap: 9 (ref 5–15)
BUN: 36 mg/dL — AB (ref 6–20)
CHLORIDE: 106 mmol/L (ref 101–111)
CO2: 24 mmol/L (ref 22–32)
Calcium: 8.8 mg/dL — ABNORMAL LOW (ref 8.9–10.3)
Creatinine, Ser: 2.96 mg/dL — ABNORMAL HIGH (ref 0.44–1.00)
GFR calc Af Amer: 18 mL/min — ABNORMAL LOW (ref >60)
GFR calc non Af Amer: 15 mL/min — ABNORMAL LOW (ref >60)
GLUCOSE: 104 mg/dL — AB (ref 65–99)
POTASSIUM: 3.8 mmol/L (ref 3.5–5.1)
Phosphorus: 3.2 mg/dL (ref 2.5–4.6)
Sodium: 139 mmol/L (ref 135–145)

## 2017-01-30 LAB — GLUCOSE, CAPILLARY
GLUCOSE-CAPILLARY: 113 mg/dL — AB (ref 65–99)
Glucose-Capillary: 101 mg/dL — ABNORMAL HIGH (ref 65–99)
Glucose-Capillary: 112 mg/dL — ABNORMAL HIGH (ref 65–99)
Glucose-Capillary: 121 mg/dL — ABNORMAL HIGH (ref 65–99)

## 2017-01-30 MED ORDER — OLANZAPINE 5 MG PO TBDP
5.0000 mg | ORAL_TABLET | Freq: Every day | ORAL | Status: DC
  Administered 2017-01-30: 5 mg via ORAL
  Filled 2017-01-30 (×2): qty 1

## 2017-01-30 MED ORDER — HYDRALAZINE HCL 20 MG/ML IJ SOLN: 10.0000 mg | INTRAMUSCULAR | Status: DC | PRN

## 2017-01-30 NOTE — Progress Notes (Signed)
Subjective: Interval History: Ativan received for agitation overnight; pt is quite sleepy this AM and can't participate in our discussion  Objective: Vital signs in last 24 hours: Temp:  [97.4 F (36.3 C)-97.8 F (36.6 C)] 97.6 F (36.4 C) (06/19 0856) Pulse Rate:  [60-80] 60 (06/19 0856) Resp:  [18] 18 (06/19 0856) BP: (162-202)/(71-92) 183/72 (06/19 0856) SpO2:  [94 %-100 %] 97 % (06/19 0856) Weight change:   Intake/Output from previous day: 06/18 0701 - 06/19 0700 In: 702 [P.O.:702] Out: 1950 [Urine:1950] Intake/Output this shift: Total I/O In: 0  Out: 200 [Urine:200]  GEN: elderly and frail woman, NAD HEENT + L eye facial bruising  NECK no JVD PULM clear anteriorly CV RRR ABD soft, no rebound or guarding EXT 1+ LE edema NEURO: not able to answer questions consistently MSK no effusions  Lab Results:  Recent Labs  01/29/17 0609 01/30/17 0555  WBC 7.6 9.2  HGB 9.1* 9.7*  HCT 29.0* 31.1*  PLT 264 265   BMET:   Recent Labs  01/29/17 0609 01/30/17 0555  NA 140 139  139  K 3.7 3.8  3.8  CL 109 106  106  CO2 23 25  24   GLUCOSE 112* 106*  104*  BUN 40* 36*  36*  CREATININE 3.11* 2.97*  2.96*  CALCIUM 8.4* 8.9  8.8*   No results for input(s): PTH in the last 72 hours. Iron Studies:  No results for input(s): IRON, TIBC, TRANSFERRIN, FERRITIN in the last 72 hours.  Studies/Results: No results found.  I have reviewed the patient's current medications.  Assessment/Plan: 1 AKI: secondary to nephrotoxic agents.  She is making urine and creatinine continues to improve.  She is not a dialysis candidate and Feliz Beamravis and I have again discussed this today.  He is in agreement that dialysis would not augment her QOL.    2 UTI- s/p course of cefepime for pseudomonas  3.  HTN: on metoprolol 100 mg BID; would recommend adding amlodipine if focus of care is to be disease modification  4.  Goals of care: palliative care involved, greatly appreciate  assistance.  Discussion to be had today regarding aggressive care vs comfort-focused care  5.  S/p CVA: has L-sided hemiplegia    LOS: 4 days   Donal Lynam 01/30/2017,1:06 PM

## 2017-01-30 NOTE — Progress Notes (Signed)
PROGRESS NOTE    Jamie Aguirre  LOV:[id number] DOB: 10/07/1945 DOA: 01/26/2017 PCP: Myrlene Broker, MD     Brief Narrative:  71 y.o. 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  Patient was just recently admitted to the hospital in 10/2016 for CVA.  Following the CVA patient noted weakness for which she was discharged to a nursing facility thereafter due to the inability to care for herself.  Patient had just had a recent fall out of her wheelchair approximately 2 days ago for which x-rays reveal no acute fractures, she was sent back to facility from the ED.  Patient recently diagnosed with urinary tract infection which was recent medications included vancomycin (but developed toxic vanclevel), then was switched to gentamicin, and lastly has been treated on 3 days of Levaquin, but reports allergy.  Cultures were positive for pan sensitive pseudomoas. She is admitted due to acute kidney injury as well as urinary tract infection.  Assessment & Plan:   Active Problems:   Type 2 diabetes mellitus (HCC)   HTN (hypertension)   Hyperlipidemia   Stroke (HCC)   ARF (acute renal failure) (HCC)   Acute lower UTI   Hyperkalemia   Anxiety   AKI (acute kidney injury) (HCC)   Goals of care, counseling/discussion   Advance care planning   Palliative care by specialist  Acute kidney injury, ATN vs AIN creatinine on admission 4.7, baseline 1.3 Initial renal acidosis of AKI now improving Appreciate nephrology input Creatinine now trending in the 3 range, continue saline 25 cc per hour Cut back sodium bicarbonate 1.3 g 3 times a day-->daily  Pyuria  -Recent history for pansensitive pseudomonas -Urine culture with contaminant -Blood culture negative to date -Repeat urine culture negative, stop antibiotics   Acute encephalopathy -Dr.  Alvino Chapel did d/c with her friend from church . 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 -Continue aspirin, Plavix  Hyperlipidemia  -d/c Lipitor  Seizure disorder -Continue depakote   Anxiety -d/c Ativan, d/c remeron -cont BuSpar, added by Pallaitve zyprexa 5 mg, trazadone 100   DVT prophylaxis: Heparin subq Code Status: Full code Family Communication: no one bedsdie-called Disposition Plan: Discharge back to nursing facility once stable   Consultants:   Nephrology  Palliative care  Procedures:   None  Antimicrobials:  Anti-infectives    Start     Dose/Rate Route Frequency Ordered Stop   01/26/17 2000  ceFEPIme (MAXIPIME) 1 g in dextrose 5 % 50 mL IVPB  Status:  Discontinued     1 g 100 mL/hr over 30 Minutes Intravenous Every 24 hours 01/26/17 1900 01/28/17 1602   01/26/17 1930  ceFEPIme (MAXIPIME) 1 g in dextrose 5 % 50 mL IVPB  Status:  Discontinued     1 g 100 mL/hr over 30 Minutes Intravenous  Once 01/26/17 1857 01/26/17 1900         Subjective:  Sleepy as received ativan overngiht Reviewed and more awake on floor   Objective: Vitals:   01/29/17 1600 01/29/17 2220 01/30/17 0532  01/30/17 0856  BP: (!) 183/92 (!) 162/73 (!) 202/71 (!) 183/72  Pulse: 73 80 64 60  Resp:   18 18  Temp: 97.4 F (36.3 C) 97.8 F (36.6 C) 97.8 F (36.6 C) 97.6 F (36.4 C)  TempSrc: Oral Oral Oral Axillary  SpO2: 98% 94% 100% 97%  Weight:      Height:        Intake/Output Summary (Last 24 hours) at 01/30/17 1543 Last data filed at 01/30/17 1400  Gross per 24 hour  Intake              342 ml  Output             1950 ml  Net             -1608 ml   Filed Weights   01/26/17 1111 01/26/17 2007 01/27/17 2033  Weight: 106.6 kg (235 lb) 107.1 kg (236 lb 1.8 oz) 92.1 kg (203 lb)    Examination:   Only slightly rousbale when examined raccon eyes from fall s1 s 2no m/r/g abd soft No le edema Neuro-sleepy and not awake enough to foll comand  Data Reviewed: I have personally reviewed following labs and imaging studies  CBC:  Recent Labs Lab 01/26/17 1127 01/27/17 0501 01/28/17 0902 01/29/17 0609 01/30/17 0555  WBC 11.3* 9.9 8.5 7.6 9.2  NEUTROABS 6.9  --  4.7 4.2 5.2  HGB 10.8* 9.4* 9.6* 9.1* 9.7*  HCT 34.5* 30.2* 30.9* 29.0* 31.1*  MCV 88.0 87.5 87.8 88.1 87.1  PLT 314 282 257 264 265   Basic Metabolic Panel:  Recent Labs Lab 01/26/17 1127 01/27/17 0501 01/28/17 0902 01/29/17 0609 01/30/17 0555  NA 137 138 142 140 139  139  K 5.2* 4.3 4.4 3.7 3.8  3.8  CL 108 112* 115* 109 106  106  CO2 19* 17* 20* 23 25  24   GLUCOSE 77 97 79 112* 106*  104*  BUN 60* 53* 46* 40* 36*  36*  CREATININE 4.75* 3.98* 3.57* 3.11* 2.97*  2.96*  CALCIUM 9.2 8.3* 8.4* 8.4* 8.9  8.8*  PHOS  --  4.0 3.3 3.1 3.2   GFR: Estimated Creatinine Clearance: 19.3 mL/min (A) (by C-G formula based on SCr of 2.96 mg/dL (H)). Liver Function Tests:  Recent Labs Lab 01/26/17 1127 01/27/17 0501 01/28/17 0902 01/29/17 0609 01/30/17 0555  AST 22  --   --   --   --   ALT 13*  --   --   --   --   ALKPHOS 47  --   --   --   --   BILITOT 0.6  --   --   --   --   PROT 6.9  --   --   --   --   ALBUMIN 3.3* 2.6* 2.5* 2.4* 2.7*   No results for input(s): LIPASE, AMYLASE in the last 168 hours.  Recent Labs Lab 01/27/17 1626  AMMONIA 17   Coagulation Profile: No results for input(s): INR, PROTIME in the last 168 hours. Cardiac Enzymes: No results for input(s): CKTOTAL, CKMB, CKMBINDEX, TROPONINI in the last 168 hours. BNP (last 3 results) No results for input(s): PROBNP in the last 8760 hours. HbA1C: No results for  input(s): HGBA1C in the last 72 hours. CBG:  Recent Labs Lab 01/29/17 1142 01/29/17 1611 01/29/17 2217 01/30/17 0807 01/30/17 1204  GLUCAP 118* 118* 160* 101* 112*   Lipid Profile: No results for input(s): CHOL, HDL, LDLCALC, TRIG, CHOLHDL,  LDLDIRECT in the last 72 hours. Thyroid Function Tests: No results for input(s): TSH, T4TOTAL, FREET4, T3FREE, THYROIDAB in the last 72 hours. Anemia Panel: No results for input(s): VITAMINB12, FOLATE, FERRITIN, TIBC, IRON, RETICCTPCT in the last 72 hours. Sepsis Labs: No results for input(s): PROCALCITON, LATICACIDVEN in the last 168 hours.  Recent Results (from the past 240 hour(s))  Blood culture (routine x 2)     Status: None (Preliminary result)   Collection Time: 01/26/17 11:30 AM  Result Value Ref Range Status   Specimen Description BLOOD RIGHT ANTECUBITAL  Final   Special Requests   Final    BOTTLES DRAWN AEROBIC AND ANAEROBIC Blood Culture adequate volume   Culture NO GROWTH 4 DAYS  Final   Report Status PENDING  Incomplete  Blood culture (routine x 2)     Status: None (Preliminary result)   Collection Time: 01/26/17 11:38 AM  Result Value Ref Range Status   Specimen Description BLOOD RIGHT HAND  Final   Special Requests IN PEDIATRIC BOTTLE Blood Culture adequate volume  Final   Culture NO GROWTH 4 DAYS  Final   Report Status PENDING  Incomplete  Urine Culture     Status: Abnormal   Collection Time: 01/26/17  4:15 PM  Result Value Ref Range Status   Specimen Description URINE, CATHETERIZED  Final   Special Requests NONE  Final   Culture MULTIPLE SPECIES PRESENT, SUGGEST RECOLLECTION (A)  Final   Report Status 01/27/2017 FINAL  Final  MRSA PCR Screening     Status: None   Collection Time: 01/26/17  5:30 PM  Result Value Ref Range Status   MRSA by PCR NEGATIVE NEGATIVE Final    Comment:        The GeneXpert MRSA Assay (FDA approved for NASAL specimens only), is one component of a comprehensive MRSA  colonization surveillance program. It is not intended to diagnose MRSA infection nor to guide or monitor treatment for MRSA infections.   Culture, Urine     Status: None   Collection Time: 01/27/17  5:35 PM  Result Value Ref Range Status   Specimen Description URINE, CATHETERIZED  Final   Special Requests NONE  Final   Culture NO GROWTH  Final   Report Status 01/28/2017 FINAL  Final       Radiology Studies: No results found.    Scheduled Meds: . busPIRone  15 mg Oral BID  . divalproex  250 mg Oral BID  . heparin  5,000 Units Subcutaneous Q8H  . lipase/protease/amylase  36,000 Units Oral TID AC  . metoprolol succinate  100 mg Oral BID  . mirtazapine  15 mg Oral QHS  . OLANZapine zydis  5 mg Oral QHS  . sodium bicarbonate  1,300 mg Oral Daily  . traZODone  100 mg Oral QHS   Continuous Infusions:    LOS: 4 days    Time-15  Pleas KochJai Jabari Swoveland, MD [facility] [phone]

## 2017-01-30 NOTE — Care Management Important Message (Signed)
Important Message  Patient Details  Name: Jamie KaufmannLinda Aguirre MRN: [id number] Date of Birth: 1946-02-02   Medicare Important Message Given:  Yes    [name] [name] 01/30/2017, 11:19 AM

## 2017-01-30 NOTE — Consult Note (Signed)
Consultation Note Date: 01/30/2017   Patient Name: Jamie Aguirre  DOB: June 09, 1946  MRN: [id number]  Age / Sex: 71 y.o., female  PCP: Jamie Koch, MD Referring Physician: Nita Sells, MD  Reason for Consultation: Establishing goals of care  HPI/Patient Profile: 71 y.o. female  with past medical history of DM, HTN, CVA with resulting dysphagia, aphasia and L sided hemiplegia admitted on 01/26/2017 for acute kidney injury (this is improving) r/t medication for UTI. During admission patient noted to have decreased po intake, declining medications, agitated at times- there is concern that patient will likely have recurrent UTI which cannot be treated d/t intolerability to antibiotics - palliative medicine consulted for [place].     Clinical Assessment and Goals of Care: Met with patient's HCPOA- Jamie Aguirre. He is a close friend- they met through their church. Before Jamie Aguirre's church she was independent, going to church, but never truly happy or active. He visited her, prepared meals for her, and worked with her at home on physical training. However, Jamie Aguirre has never enjoyed medical care, has always chosen to not take medications that are prescribed to her, and has been generally noncompliant to any medical advice in managing her diabetes and HTN. He has noticed that she has significantly declined since her stroke. Her quality of life is quite poor. She lives a bed to wheelchair existence at nursing facility. She is completely independent for ADL's- wears a depends for toileting. He is not aware of anything that brings her joy. He has noticed in the last month she has stopped eating and has lost weight. Chart review shows weight down to 92kg this admission from 103 kg in March. Her cognition has also declined significantly- her cognition waxes and wanes. He notes she has started calling out for her Jamie Aguirre  (who is deceased)- this is new.  Introduced palliative medicine. Palliative medicine is specialized medical care for people living with serious illness. It focuses on providing relief from the symptoms and stress of a serious illness. The goal is to improve quality of life for both the patient and the family. I shared my concerns that patient is showing signs of approaching end of life. Jamie Aguirre confirmed this concern. We discussed the difference between continued aggressive care with return hospital trips for IV fluids, or IV antibiotics for recurrent UTI or likely pneumonia (which will most definitely occur sooner rather than later, and patient seems to limited in antibiotic choices); possible feeding tube Jamie Aguirre states patient has advance directives indicating she would not choose this) vs change in goals to comfort care and support from Hospice.  Described role of comfort care and services Hospice provides. Jamie Aguirre has spoken to patient's facility and states they have said they would accept patient back with Hospice services and work well with Hospice. He believes patient would choose to proceed with comfort measures.  We discussed code status. Jamie Aguirre requests that patient receive DNR status to align with goals of supporting comfort and quality through end of life.   Primary Decision Maker HCPOA -  Jamie Aguirre    SUMMARY OF RECOMMENDATIONS -DNR -No feeding tube, no aggressive diagnostics -D/C to SNF with Hospice support with goal of not returning to hospital for IV fluids or IV antibiotics -Allow for comfort feeding, regular diet to patient's choosing -Continue lorazepam prn for agitation -Start low dose zyprexa 42m for night time delirium      Code Status/Advance Care Planning:  DNR    Symptom Management:   As above  Palliative Prophylaxis:   Delirium Protocol and Frequent Pain Assessment  Additional Recommendations (Limitations, Scope, Preferences):  Avoid Hospitalization,  Full Comfort Care and Minimize Medications  Psycho-social/Spiritual:   Desire for further Chaplaincy support:no  Additional Recommendations: Education on Hospice  Prognosis:   < 6 months  D/t CVA with resulting sig deficits in functional status, now rapidly declining nutritional, and cognitive status as well, likelihood of recurrent UTI high however treatment limited, poor quality of life, GOC transitioned to comfort- hospice  Discharge Planning: [place] Hospice  Primary Diagnoses: Present on Admission: . ARF (acute renal failure) ([place] . Stroke ([place] . Hyperlipidemia . HTN (hypertension) . Acute lower UTI . Hyperkalemia . Anxiety   I have reviewed the medical record, interviewed the patient and family, and examined the patient. The following aspects are pertinent.  Past Medical History:  Diagnosis Date  . Allergic rhinitis, seasonal    assocaited with vertigo sensation  . Arthritis   . Asthma   . Chronic bronchitis   . Chronic kidney disease   . Diverticulitis   . Headaches, cluster   . Hives    idiopathic  . Hyperlipidemia   . Hypertension   . Pellucid marginal degeneration of cornea   . Stroke ([place] 09/2005, 101/6010  L PCA embolic  . Type 2 diabetes mellitus ([place]    Social History   Social History  . Marital status: Married    Spouse name: N/A  . Number of children: N/A  . Years of education: N/A   Social History Main Topics  . Smoking status: Former Smoker    Packs/day: 3.50    Years: 20.00    Types: Cigarettes    Quit date: 08/15/1975  . Smokeless tobacco: Never Used  . Alcohol use No  . Drug use: No  . Sexual activity: Not Currently   Other Topics Concern  . None   Social History Narrative   Single, lives alone   Previous caregiver for mom who passed in 2006   Retired - clerical work with [employer]   Family History  Problem Relation Age of Onset  . Heart disease Father   . Arthritis Other   . Stroke Other   .  Hypertension Other   . Hyperlipidemia Other   . Diabetes Other    Scheduled Meds: . aspirin  325 mg Oral Daily  . atorvastatin  40 mg Oral q1800  . busPIRone  15 mg Oral BID  . clopidogrel  75 mg Oral Q supper  . divalproex  250 mg Oral BID  . heparin  5,000 Units Subcutaneous Q8H  . insulin aspart  0-9 Units Subcutaneous TID WC  . insulin detemir  16 Units Subcutaneous BID  . lipase/protease/amylase  36,000 Units Oral TID AC  . metoprolol succinate  100 mg Oral BID  . mirtazapine  15 mg Oral QHS  . simethicone  160 mg Oral BID  . sodium bicarbonate  1,300 mg Oral Daily  . traZODone  100 mg Oral QHS   Continuous Infusions: PRN  Meds:.acetaminophen, albuterol, alum & mag hydroxide-simeth, guaiFENesin, hydrALAZINE, liver oil-zinc oxide, loperamide, LORazepam, Melatonin, ondansetron **OR** ondansetron (ZOFRAN) IV Medications Prior to Admission:  Prior to Admission medications   Medication Sig Start Date End Date Taking? Authorizing Provider  acetaminophen (TYLENOL 8 HOUR) 650 MG CR tablet Take 650 mg by mouth every 8 (eight) hours as needed (lower back pain). May also take every 6 hours for moderate pain   Yes [provider]  aspirin EC 325 MG EC tablet Take 1 tablet (325 mg total) by mouth daily. 10/28/16  Yes Velvet Bathe, MD  atorvastatin (LIPITOR) 40 MG tablet Take 1 tablet (40 mg total) by mouth daily at 6 PM. 10/27/16  Yes Velvet Bathe, MD  busPIRone (BUSPAR) 15 MG tablet Take 15 mg by mouth 2 (two) times daily.   Yes [provider]  clopidogrel (PLAVIX) 75 MG tablet TAKE 1 TABLET BY MOUTH ONCE DAILY 08/27/15  Yes Jamie Koch, MD  divalproex (DEPAKOTE) 125 MG DR tablet Take 250 mg by mouth 2 (two) times daily. 12/18/16  Yes [provider]  LEVEMIR FLEXTOUCH 100 UNIT/ML Pen Inject 16 Units into the skin 2 (two) times daily. 01/14/17  Yes [provider]  lipase/protease/amylase (CREON) 36000 UNITS CPEP capsule Take 1 capsule (36,000 Units  total) by mouth 3 (three) times daily before meals. 07/27/16  Yes Jamie Koch, MD  LORazepam (ATIVAN) 0.5 MG tablet Take 0.5 mg by mouth every 8 (eight) hours as needed for anxiety.  12/18/16  Yes [provider]  Melatonin 3 MG TABS Take 3 mg by mouth daily as needed (sleep).    Yes [provider]  metoprolol succinate (TOPROL-XL) 100 MG 24 hr tablet TAKE 1 TABLET BY MOUTH TWICE A DAY 09/04/16  Yes Jamie Koch, MD  mirtazapine (REMERON) 15 MG tablet Take 15 mg by mouth at bedtime. For appetite stimulant 01/24/17  Yes [provider]  NOVOLOG FLEXPEN 100 UNIT/ML FlexPen Inject 0-10 Units into the skin 3 (three) times daily before meals. If 70-100=0u, 101-150= 2u, 151-200= 4u, 201-250= 6u, 251-300= 8u, 301-400= 10u 12/18/16  Yes [provider]  simethicone (MYLICON) 80 MG chewable tablet Chew 160 mg by mouth 2 (two) times daily.   Yes [provider]  traZODone (DESYREL) 50 MG tablet Take 100 mg by mouth at bedtime.  01/19/17  Yes [provider]  Zinc Oxide (DESITIN) 13 % CREA Apply 1 application topically as directed. Apply to buttocks with each incontinence and after every episode   Yes [provider]  albuterol (VENTOLIN HFA) 108 (90 BASE) MCG/ACT inhaler Inhale 2 puffs into the lungs every 6 (six) hours as needed. 08/17/14   Jamie Koch, MD  alum & mag hydroxide-simeth [facility]) 200-200-20 MG/5ML suspension Take 30 mLs by mouth every 6 (six) hours as needed.    [provider]  bisacodyl (DULCOLAX) 5 MG EC tablet Take 10 mg by mouth daily as needed (for constipation).    [provider]  guaiFENesin (ROBITUSSIN) 100 MG/5ML liquid Take 400 mg by mouth every 4 (four) hours as needed for cough.    [provider]  JENTADUETO 2.12-998 MG TABS TAKE 1 TABLET BY MOUTH TWICE DAILY Patient not taking: Reported on 01/26/2017 12/24/15   Jamie Koch, MD  loperamide (IMODIUM A-D) 2 MG tablet  Take 2 mg by mouth every 4 (four) hours as needed for diarrhea or loose stools.    [provider]  losartan (COZAAR) 100 MG tablet  TAKE 1 TABLET BY MOUTH EVERY DAY Patient not taking: Reported on 01/26/2017 09/04/16   Jamie Koch, MD  triamcinolone cream (KENALOG) 0.1 % Apply 1 application topically 2 (two) times daily as needed. Patient not taking: Reported on 01/26/2017 02/28/16   Jamie Koch, MD   Allergies  Allergen Reactions  . Codeine Shortness Of Breath and Other (See Comments)    Flu-like symptoms (also)  . Penicillins Anaphylaxis    Has patient had a PCN reaction causing immediate rash, facial/tongue/throat swelling, SOB or lightheadedness with hypotension: Yes Has patient had a PCN reaction causing severe rash involving mucus membranes or skin necrosis: Unk Has patient had a PCN reaction that required hospitalization: Yes (but was already in the hospital) Has patient had a PCN reaction occurring within the last 10 years: No If all of the above answers are "NO", then may proceed with Cephalosporin use.   . Ciprofloxacin Other (See Comments)    Results in IMMEDIATE VERTIGO  . Lactose Intolerance (Gi) Diarrhea  . Latex Dermatitis    Also causes skin to become red & bumpy  . Spinach Diarrhea    Nor kale, nor lettuce, no green foods, nor spicy foods (EXPLOSIVE DIARRHEA)  . Sulfa Antibiotics Other (See Comments)    Reaction unknown to patient (allergy from childhood)  . Tape Rash    Causes skin to become red & bumpy   Review of Systems  Unable to perform ROS: Dementia    Physical Exam  Constitutional: She appears well-developed and well-nourished. No distress.  Cardiovascular: Normal rate.   Musculoskeletal:  R paresis  Neurological:  Lethargic, confused, not oriented  Skin: There is pallor.  Left eye bruised with ocular edema  Psychiatric:  Flat affect  Nursing note and vitals reviewed.   Vital Signs: BP (!) 183/72 (BP Location: Right Arm)    Pulse 60   Temp 97.6 F (36.4 C) (Axillary)   Resp 18   Ht _0  (1.6 m)   Wt 92.1 kg (203 lb)   SpO2 97%   BMI 35.96 kg/m  Pain Assessment: No/denies pain   Pain Score: 0-No pain   SpO2: SpO2: 97 % O2 Device:SpO2: 97 % O2 Flow Rate: .   IO: Intake/output summary:  Intake/Output Summary (Last 24 hours) at 01/30/17 1338 Last data filed at 01/30/17 0857  Gross per 24 hour  Intake              462 ml  Output             1700 ml  Net            -1238 ml    LBM: Last BM Date: 01/26/17 Baseline Weight: Weight: 106.6 kg (235 lb) Most recent weight: Weight: 92.1 kg (203 lb)     Palliative Assessment/Data: PPS: 30%     Thank you for this consult. Palliative medicine will continue to follow and assist as needed.   Time In: 1130 Time Out: 1330 Time Total: 120 minutes  Greater than 50%  of this time was spent counseling and coordinating care related to the above assessment and plan.  Signed by: Mariana Kaufman, AGNP-C Palliative Medicine    Please contact Palliative Medicine Team phone at [phone] for questions and concerns.  For individual provider: See Shea Evans

## 2017-01-31 DIAGNOSIS — R52 Pain, unspecified: Secondary | ICD-10-CM

## 2017-01-31 DIAGNOSIS — Z7189 Other specified counseling: Secondary | ICD-10-CM

## 2017-01-31 LAB — CBC WITH DIFFERENTIAL/PLATELET
Basophils Absolute: 0 10*3/uL (ref 0.0–0.1)
Basophils Relative: 1 %
EOS PCT: 11 %
Eosinophils Absolute: 1 10*3/uL — ABNORMAL HIGH (ref 0.0–0.7)
HCT: 30.3 % — ABNORMAL LOW (ref 36.0–46.0)
Hemoglobin: 9.7 g/dL — ABNORMAL LOW (ref 12.0–15.0)
LYMPHS ABS: 2 10*3/uL (ref 0.7–4.0)
LYMPHS PCT: 23 %
MCH: 28.1 pg (ref 26.0–34.0)
MCHC: 32 g/dL (ref 30.0–36.0)
MCV: 87.8 fL (ref 78.0–100.0)
MONOS PCT: 12 %
Monocytes Absolute: 1.1 10*3/uL — ABNORMAL HIGH (ref 0.1–1.0)
Neutro Abs: 4.8 10*3/uL (ref 1.7–7.7)
Neutrophils Relative %: 53 %
PLATELETS: 255 10*3/uL (ref 150–400)
RBC: 3.45 MIL/uL — AB (ref 3.87–5.11)
RDW: 17.3 % — ABNORMAL HIGH (ref 11.5–15.5)
WBC: 8.8 10*3/uL (ref 4.0–10.5)

## 2017-01-31 LAB — CULTURE, BLOOD (ROUTINE X 2)
CULTURE: NO GROWTH
Culture: NO GROWTH
SPECIAL REQUESTS: ADEQUATE
Special Requests: ADEQUATE

## 2017-01-31 MED ORDER — OLANZAPINE 5 MG PO TBDP: 5.0000 mg | ORAL_TABLET | Freq: Every day | ORAL | 0 refills | Status: AC

## 2017-01-31 MED ORDER — SODIUM BICARBONATE 650 MG PO TABS: 1300.0000 mg | ORAL_TABLET | Freq: Every day | ORAL | Status: AC

## 2017-01-31 NOTE — Care Management Note (Signed)
Case Management Note  Patient Details  Name: Jamie KaufmannLinda Aguirre MRN: [id number] Date of Birth: 04-15-1946  Subjective/Objective:                 Patient to DC to SNF today as facilitated by CSW.   Action/Plan:  DC to SNF.  Expected Discharge Date:  01/31/17               Expected Discharge Plan:  Skilled Nursing Facility  In-House Referral:  Clinical Social Work  Discharge planning Services  CM Consult  Post Acute Care Choice:    Choice offered to:     DME Arranged:    DME Agency:     HH Arranged:    HH Agency:     Status of Service:  Completed, signed off  If discussed at [employer] Meetings, dates discussed:    Additional Comments:  Lawerance SabalDebbie Casmira Cramer, RN 01/31/2017, 11:05 AM

## 2017-01-31 NOTE — Clinical Social Work Note (Signed)
Clinical Social Work Assessment  Patient Details  Name: Jamie KaufmannLinda Aguirre MRN: [id number] Date of Birth: [birth year]  Date of referral:  01/31/17               Reason for consult:  Facility Placement                Permission sought to share information with:  Family Supports Permission granted to share information::  No (Patient oriented to person only)  Name::     Jamie Aguirre  Agency::     Relationship::  Friend  Contact Information:  [phone]  Housing/Transportation Living arrangements for the past 2 months:  Assisted Living Facility ([facility]) Source of Information:  Other (Comment Required) [name] Patient Interpreter Needed:  None Criminal Activity/Legal Involvement Pertinent to Current Situation/Hospitalization:  No - Comment as needed Significant Relationships:  Friend Lives with:  Facility Resident Do you feel safe going back to the place where you live?  Yes Need for family participation in patient care:  Yes (Comment) (Friend very involved)  Care giving concerns: None expressed by friend Feliz Beamravis regarding care at ALF.   Social Worker assessment / plan:  CSW talked with Jamie Aguirre by phone and informed him that patient is medically stable for discharge. [name] confirmed [facility].   Employment status:    Insurance informationAdvertising account executive:  Managed Medicare PT Recommendations:  Not assessed at this time Information / Referral to community resources:  Other (Comment Required) (None needed or requested as patient from ALF)  Patient/Family's Response to care:  No concerns expressed by Mr. Aguirre regarding patient's care during hospitalization.  Patient/Family's Understanding of and Emotional Response to Diagnosis, Current Treatment, and Prognosis: Not discussed.  Emotional Assessment Appearance:  Other (Comment Required (Did not meet with patient as oriented to self only) Attitude/Demeanor/Rapport:  Unable  to Assess Affect (typically observed):  Unable to Assess Orientation:  Oriented to Self Alcohol / Substance use:  Tobacco Use, Alcohol Use, Illicit Drugs (Patient reports that she quit smoking and does not drink or use illicit drugs) Psych involvement (Current and /or in the community):  No (Comment)  Discharge Needs  Concerns to be addressed:  Discharge Planning Concerns Readmission within the last 30 days:  No Current discharge risk:  None Barriers to Discharge:  No Barriers Identified   [name], [name], [name] 01/31/2017, 4:50 PM

## 2017-01-31 NOTE — NC FL2 (Signed)
[place] MEDICAID FL2 LEVEL OF CARE SCREENING TOOL     IDENTIFICATION  Patient Name: Benelli Winther Birthdate: 1946/03/13 Sex: female Admission Date (Current Location): 01/26/2017  [facility] and [place] Number:  Producer, television/film/video and Address:  [facility], [address] [place], [place] [address]      Provider Number: [id number]  Attending Physician Name and Address:  Rhetta Mura, MD  Relative Name and Phone Number:  Ellsworth Lennox - friend; [phone]    Current Level of Care: Hospital Recommended Level of Care: Assisted Living Facility ([facility]) Prior Approval Number:    Date Approved/Denied:   PASRR Number:    Discharge Plan: Other (Comment) [facility] - patient is Assisted Living level of care)    Current Diagnoses: Patient Active Problem List   Diagnosis Date Noted  . AKI (acute kidney injury) (HCC)   . Goals of care, counseling/discussion   . Advance care planning   . Palliative care by specialist   . ARF (acute renal failure) (HCC) 01/26/2017  . Acute lower UTI 01/26/2017  . Hyperkalemia 01/26/2017  . Anxiety 01/26/2017  . Hypertensive emergency 10/21/2016  . Stroke (HCC) 10/21/2016  . Urinary frequency 10/21/2016  . Left hip pain 10/21/2016  . Arthritis of left ankle 11/09/2015  . Abnormal PFTs (pulmonary function tests) 07/22/2015  . Chronic venous insufficiency 04/12/2015  . Routine general medical examination at a health care facility 08/27/2014  . Chronic diarrhea of unknown origin 05/15/2014  . Arthritis of right ankle 07/08/2013  . Depressive disorder 10/09/2012  . Vertigo   . OSA (obstructive sleep apnea)   . Obese 01/01/2012  . Type 2 diabetes mellitus (HCC)   . HTN (hypertension)   . Hyperlipidemia   . Acute ischemic left MCA stroke (HCC)   . Allergic rhinitis, seasonal     Orientation RESPIRATION BLADDER Height & Weight     Self  Normal Indwelling catheter (for  acute urinary retention) Weight: 203 lb (92.1 kg) Height:  5\' 3"  (160 cm)  BEHAVIORAL SYMPTOMS/MOOD NEUROLOGICAL BOWEL NUTRITION STATUS      Continent Diet (Low sodium - Heart healthy)  AMBULATORY STATUS COMMUNICATION OF NEEDS Skin   Total Care (Wheelchair bound) Verbally Other (Comment) (Ecchymosis on face)                       Personal Care Assistance Level of Assistance  Bathing, Feeding, Dressing Bathing Assistance: Maximum assistance Feeding assistance: Maximum assistance Dressing Assistance: Maximum assistance     Functional Limitations Info  Sight, Hearing, Speech Sight Info: Adequate Hearing Info: Impaired Speech Info: Adequate    SPECIAL CARE FACTORS FREQUENCY                       Contractures Contractures Info: Not present    Additional Factors Info  Code Status, Allergies Code Status Info: DNR Allergies Info: Penicillins, Codeine, Ciprofloxacin, Lactose intolerance, Latex, Spinach - Nor kale, nor lettuce, no green foods, nor spicy foods; Sulfa antibiotics, Tape           Current Medications (01/31/2017):  This is the current hospital active medication list Current Facility-Administered Medications  Medication Dose Route Frequency Provider Last Rate Last Dose  . acetaminophen (TYLENOL) tablet 650 mg  650 mg Oral Q6H PRN Smith, Rondell A, MD      . albuterol (PROVENTIL) (2.5 MG/3ML) 0.083% nebulizer solution 2.5 mg  2.5 mg Nebulization Q4H PRN Madelyn Flavors  A, MD      . alum & mag hydroxide-simeth (MAALOX/MYLANTA) 200-200-20 MG/5ML suspension 30 mL  30 mL Oral Q6H PRN Smith, Rondell A, MD      . busPIRone (BUSPAR) tablet 15 mg  15 mg Oral BID Katrinka Blazing, Rondell A, MD   15 mg at 01/31/17 1028  . divalproex (DEPAKOTE) DR tablet 250 mg  250 mg Oral BID Madelyn Flavors A, MD   250 mg at 01/31/17 1028  . guaiFENesin (ROBITUSSIN) 100 MG/5ML solution 400 mg  400 mg Oral Q4H PRN Madelyn Flavors A, MD      . heparin injection 5,000 Units  5,000 Units Subcutaneous  Q8H Madelyn Flavors A, MD   5,000 Units at 01/30/17 2155  . lipase/protease/amylase (CREON) capsule 36,000 Units  36,000 Units Oral TID AC Clydie Braun, MD   36,000 Units at 01/30/17 1712  . liver oil-zinc oxide (DESITIN) 40 % ointment   Topical PRN Madelyn Flavors A, MD      . Melatonin TABS 3 mg  3 mg Oral Daily PRN Madelyn Flavors A, MD   3 mg at 01/30/17 2154  . metoprolol succinate (TOPROL-XL) 24 hr tablet 100 mg  100 mg Oral BID Katrinka Blazing, Rondell A, MD   100 mg at 01/31/17 1028  . OLANZapine zydis (ZYPREXA) disintegrating tablet 5 mg  5 mg Oral QHS Barbara Cower, NP   5 mg at 01/30/17 2154  . ondansetron (ZOFRAN) tablet 4 mg  4 mg Oral Q6H PRN Madelyn Flavors A, MD       Or  . ondansetron (ZOFRAN) injection 4 mg  4 mg Intravenous Q6H PRN Madelyn Flavors A, MD   4 mg at 01/28/17 2026  . sodium bicarbonate tablet 1,300 mg  1,300 mg Oral Daily Rhetta Mura, MD   1,300 mg at 01/31/17 1028  . traZODone (DESYREL) tablet 100 mg  100 mg Oral QHS Madelyn Flavors A, MD   100 mg at 01/30/17 2154     Discharge Medications: Please see discharge summary for a list of discharge medications.  Relevant Imaging Results:  Relevant Lab Results:   Additional Information MD Order - Recommend palliative care to follow this patient as an [facility] at [facility] if failing to progress--HCPOA [name] has elected for FULL comfort on her behalf.  DISCHARGE MEDICATIONS: START taking these medications   Details  OLANZapine zydis (ZYPREXA) 5 MG disintegrating tablet Take 1 tablet (5 mg total) by mouth at bedtime. Qty: 3 tablet, Refills: 0    sodium bicarbonate 650 MG tablet Take 2 tablets (1,300 mg total) by mouth daily.          CONTINUE these medications which have NOT CHANGED   Details  acetaminophen (TYLENOL 8 HOUR) 650 MG CR tablet Take 650 mg by mouth every 8 (eight) hours as needed (lower back pain). May also take every 6 hours for moderate pain    aspirin EC 325 MG EC tablet Take 1 tablet (325  mg total) by mouth daily. Qty: 30 tablet, Refills: 0    busPIRone (BUSPAR) 15 MG tablet Take 15 mg by mouth 2 (two) times daily.    clopidogrel (PLAVIX) 75 MG tablet TAKE 1 TABLET BY MOUTH ONCE DAILY Qty: 90 tablet, Refills: 4    divalproex (DEPAKOTE) 125 MG DR tablet Take 250 mg by mouth 2 (two) times daily.    lipase/protease/amylase (CREON) 36000 UNITS CPEP capsule Take 1 capsule (36,000 Units total) by mouth 3 (three) times daily before meals. Qty: 180 capsule, Refills:  6    Melatonin 3 MG TABS Take 3 mg by mouth daily as needed (sleep).     metoprolol succinate (TOPROL-XL) 100 MG 24 hr tablet TAKE 1 TABLET BY MOUTH TWICE A DAY Qty: 180 tablet, Refills: 1    traZODone (DESYREL) 50 MG tablet Take 100 mg by mouth at bedtime.     Zinc Oxide (DESITIN) 13 % CREA Apply 1 application topically as directed. Apply to buttocks with each incontinence and after every episode    albuterol (VENTOLIN HFA) 108 (90 BASE) MCG/ACT inhaler Inhale 2 puffs into the lungs every 6 (six) hours as needed. Qty: 18 g, Refills: 5    alum & mag hydroxide-simeth (MYLANTA) 200-200-20 MG/5ML suspension Take 30 mLs by mouth every 6 (six) hours as needed.    JENTADUETO 2.12-998 MG TABS TAKE 1 TABLET BY MOUTH TWICE DAILY Qty: 180 tablet, Refills: 3    triamcinolone cream (KENALOG) 0.1 % Apply 1 application topically 2 (two) times daily as needed. Qty: 80 g, Refills: 0         STOP taking these medications     atorvastatin (LIPITOR) 40 MG tablet      LEVEMIR FLEXTOUCH 100 UNIT/ML Pen      LORazepam (ATIVAN) 0.5 MG tablet      mirtazapine (REMERON) 15 MG tablet      NOVOLOG FLEXPEN 100 UNIT/ML FlexPen      simethicone (MYLICON) 80 MG chewable tablet      bisacodyl (DULCOLAX) 5 MG EC tablet      guaiFENesin (ROBITUSSIN) 100 MG/5ML liquid      loperamide (IMODIUM A-D) 2 MG tablet      losartan (COZAAR) 100 MG tablet        Okey Duprerawford, Lazaro ArmsVanessa  Bradley, [name]

## 2017-01-31 NOTE — Discharge Summary (Addendum)
Physician Discharge Summary  Jamie KaufmannLinda Aguirre ZOX:[id number] DOB: September 10, 1945 DOA: 01/26/2017  PCP: Myrlene Brokerrawford, Elizabeth A, MD  Admit date: 01/26/2017 Discharge date: 01/31/2017  Time spent: 35 minutes  Recommendations for Outpatient Follow-up:  1. recommend palliative care to follow this patient [facility] an [facility] if failing to progress--HCPOA Feliz Beamravis has elected for FULL comfort on her behalf 2. Please review MAR for changes to meds from prior-we have d/c statin, some of the antidepressants etc.  We have also held off of some Anti-Htn meds like losartan 3. would NOT cover with insulin as OP at [facility]--Suggest only oral meds and periodic rather than reg checks of CBG as the patient is minimally eating 4. De-escalate care if prn.  Discharge Diagnoses:  Active Problems:   Type 2 diabetes mellitus (HCC)   HTN (hypertension)   Hyperlipidemia   Stroke (HCC)   ARF (acute renal failure) (HCC)   Acute lower UTI   Hyperkalemia   Anxiety   AKI (acute kidney injury) (HCC)   Goals of care, counseling/discussion   Advance care planning   Palliative care by specialist   Discharge Condition: gaurded  Diet recommendation: diabetic hh comfort focused feeds  Filed Weights   01/26/17 1111 01/26/17 2007 01/27/17 2033  Weight: 106.6 kg (235 lb) 107.1 kg (236 lb 1.8 oz) 92.1 kg (203 lb)    History of present illness:   71 y.o.fem  Asperger's syndrome,  HTN,  HLD,  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 Patient was just recently admitted to the hospital in 10/2016 for CVA.  Following the CVA patient noted weakness for which she was discharged to a nursing facility thereafter due to the inability to care for herself.  Patient had just had a recent fall out of her wheelchair approximately 2 days ago for which x-rays reveal no acute fractures, she was sent back to facility from  the ED.  Patient recently diagnosed with urinary tract infection which was recent medications included vancomycin (but developed toxic vanclevel), then was switched to gentamicin, and lastly has been treated on 3 days of Levaquin,but reports allergy.  Cultures were positive for pan sensitive pseudomoas. She is admitted due to acute kidney injury as well as urinary tract infection.   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 Alvino Chapelhoi did d/c with her friend from church . 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 -Continue aspirin, Plavix  Hyperlipidemia  -d/c Lipitor  Seizure disorder -Continue depakote   Anxiety -d/c Ativan, d/c remeron -cont BuSpar, added by Pallaitve zyprexa 5 mg, trazadone 100  Consultations:  pallaitive  neprho  Discharge Exam: Vitals:   01/30/17 2116 01/31/17 0815  BP: (!) 155/89 (!) 196/75  Pulse: 78 63  Resp: 18 18  Temp: 97.9 F (36.6 C) 98.1 F (36.7 C)    General: eomi ncat Cardiovascular: s1 s 2no m/r/g Respiratory: clear no adde dsound Intact to person Slight  confusion Dense L hemiparesis  Discharge Instructions   Discharge Instructions    Diet - low sodium heart healthy    Complete by:  As directed    Increase activity slowly    Complete by:  As directed      Current Discharge Medication List    START taking these medications   Details  OLANZapine zydis (ZYPREXA) 5 MG disintegrating tablet Take 1 tablet (5 mg total) by mouth at bedtime. Qty: 3 tablet, Refills: 0    sodium bicarbonate 650 MG tablet Take 2 tablets (1,300 mg total) by mouth daily.      CONTINUE these medications which have NOT CHANGED   Details  acetaminophen (TYLENOL 8 HOUR) 650 MG CR tablet Take 650 mg by mouth every 8 (eight) hours as needed (lower back pain). May also take every 6 hours for moderate pain    aspirin EC 325 MG EC tablet Take 1 tablet (325 mg total) by mouth daily. Qty: 30 tablet, Refills: 0    busPIRone (BUSPAR) 15 MG tablet Take 15 mg by mouth 2 (two) times daily.    clopidogrel (PLAVIX) 75 MG tablet TAKE 1 TABLET BY MOUTH ONCE DAILY Qty: 90 tablet, Refills: 4    divalproex (DEPAKOTE) 125 MG DR tablet Take 250 mg by mouth 2 (two) times daily.    lipase/protease/amylase (CREON) 36000 UNITS CPEP capsule Take 1 capsule (36,000 Units total) by mouth 3 (three) times daily before meals. Qty: 180 capsule, Refills: 6    Melatonin 3 MG TABS Take 3 mg by mouth daily as needed (sleep).     metoprolol succinate (TOPROL-XL) 100 MG 24 hr tablet TAKE 1 TABLET BY MOUTH TWICE A DAY Qty: 180 tablet, Refills: 1    traZODone (DESYREL) 50 MG tablet Take 100 mg by mouth at bedtime.     Zinc Oxide (DESITIN) 13 % CREA Apply 1 application topically as directed. Apply to buttocks with each incontinence and after every episode    albuterol (VENTOLIN HFA) 108 (90 BASE) MCG/ACT inhaler Inhale 2 puffs into the lungs every 6 (six) hours as needed. Qty: 18 g, Refills: 5    alum & mag hydroxide-simeth (MYLANTA) 200-200-20 MG/5ML suspension Take 30 mLs by mouth every 6  (six) hours as needed.    JENTADUETO 2.12-998 MG TABS TAKE 1 TABLET BY MOUTH TWICE DAILY Qty: 180 tablet, Refills: 3    triamcinolone cream (KENALOG) 0.1 % Apply 1 application topically 2 (two) times daily as needed. Qty: 80 g, Refills: 0      STOP taking these medications     atorvastatin (LIPITOR) 40 MG tablet      LEVEMIR FLEXTOUCH 100 UNIT/ML Pen      LORazepam (ATIVAN) 0.5 MG tablet      mirtazapine (REMERON) 15 MG tablet      NOVOLOG FLEXPEN 100 UNIT/ML FlexPen      simethicone (MYLICON) 80 MG chewable tablet      bisacodyl (DULCOLAX) 5 MG EC tablet      guaiFENesin (ROBITUSSIN) 100 MG/5ML liquid      loperamide (IMODIUM A-D) 2 MG tablet      losartan (COZAAR) 100 MG tablet  Allergies  Allergen Reactions  . Codeine Shortness Of Breath and Other (See Comments)    Flu-like symptoms (also)  . Penicillins Anaphylaxis    Has patient had a PCN reaction causing immediate rash, facial/tongue/throat swelling, SOB or lightheadedness with hypotension: Yes Has patient had a PCN reaction causing severe rash involving mucus membranes or skin necrosis: Unk Has patient had a PCN reaction that required hospitalization: Yes (but was already in the hospital) Has patient had a PCN reaction occurring within the last 10 years: No If all of the above answers are "NO", then may proceed with Cephalosporin use.   . Ciprofloxacin Other (See Comments)    Results in IMMEDIATE VERTIGO  . Lactose Intolerance (Gi) Diarrhea  . Latex Dermatitis    Also causes skin to become red & bumpy  . Spinach Diarrhea    Nor kale, nor lettuce, no green foods, nor spicy foods (EXPLOSIVE DIARRHEA)  . Sulfa Antibiotics Other (See Comments)    Reaction unknown to patient (allergy from childhood)  . Tape Rash    Causes skin to become red & bumpy      The results of significant diagnostics from this hospitalization (including imaging, microbiology, ancillary and laboratory) are listed below for  reference.    Significant Diagnostic Studies: Dg Chest 1 View  Result Date: 01/26/2017 CLINICAL DATA:  Fall. EXAM: CHEST 1 VIEW COMPARISON:  Radiographs of Jan 06, 2015. FINDINGS: Stable cardiomediastinal silhouette. No pneumothorax or pleural effusion is noted. No acute pulmonary disease is noted. Bony thorax is unremarkable. IMPRESSION: No acute cardiopulmonary abnormality seen. Electronically Signed   By: Lupita Raider, M.D.   On: 01/26/2017 13:48   Dg Knee 1-2 Views Left  Result Date: 01/26/2017 CLINICAL DATA:  Left knee pain after fall. EXAM: LEFT KNEE - 1-2 VIEW COMPARISON:  None. FINDINGS: No evidence of fracture, dislocation, or joint effusion. Severe narrowing of medial joint space is noted. Osteophyte formation is noted laterally. Soft tissues are unremarkable. IMPRESSION: Severe degenerative joint disease. No acute abnormality seen in the left knee. Electronically Signed   By: Lupita Raider, M.D.   On: 01/26/2017 13:49   Ct Head Wo Contrast  Result Date: 01/26/2017 CLINICAL DATA:  Fall with pain EXAM: CT HEAD WITHOUT CONTRAST TECHNIQUE: Contiguous axial images were obtained from the base of the skull through the vertex without intravenous contrast. COMPARISON:  January 22, 2017 FINDINGS: Brain: There is moderate diffuse atrophy. There is no intracranial mass, hemorrhage, extra-axial fluid collection, or midline shift. There is small vessel disease throughout the centra semiovale bilaterally. There is a prior small infarct in the left centrum semiovale. There is small vessel disease in each external capsule. There is a small prior infarct involving a portion of the head of the caudate nucleus on the right. There is a prior infarct in the medial pons-midbrain junction on the right. There is no acute infarct evident. Vascular: There is no appreciable hyperdense vessel. There is calcification in each carotid siphon. Skull: There is a left frontal scalp hematoma. No fracture evident. Bony  calvarium appears intact. Sinuses/Orbits: There is mucosal thickening in several ethmoid air cells bilaterally. There is moderate opacification in the posterior sphenoid sinuses bilaterally, more on the right than on the left. Visualized paranasal sinuses elsewhere clear. Visualized orbits appear symmetric bilaterally. Other: Mastoid air cells are clear. IMPRESSION: Atrophy with small vessel disease and prior infarcts as noted. No intracranial mass hemorrhage, or extra-axial fluid collection. No acute infarct evident. There are focal areas of  vascular calcification. There is a left frontal scalp hematoma without underlying fracture. There are areas of paranasal sinus disease. Electronically Signed   By: Bretta Bang III M.D.   On: 01/26/2017 13:00   Ct Head Wo Contrast  Result Date: 01/22/2017 CLINICAL DATA:  Fall from wheelchair at nursing home today with left periorbital hematoma. EXAM: CT HEAD WITHOUT CONTRAST CT CERVICAL SPINE WITHOUT CONTRAST TECHNIQUE: Multidetector CT imaging of the head and cervical spine was performed following the standard protocol without intravenous contrast. Multiplanar CT image reconstructions of the cervical spine were also generated. COMPARISON:  Head CT 10/23/2016 FINDINGS: CT HEAD FINDINGS There is motion artifact. Brain: Ventricles and cisterns are within normal. There is chronic ischemic microvascular disease present. Old lacunar infarct over the head of the right caudate nucleus as well as the right periventricular region unchanged. Chronic ischemic change over the junction of the midbrain to pons. No evidence of mass, mass effect, shift of midline structures or acute hemorrhage. No definite acute infarction. Vascular: No hyperdense vessel or unexpected calcification. Skull: Normal. Negative for fracture or focal lesion. Sinuses/Orbits: Orbits are within normal.  Sinuses are clear. Other: Large left periorbital/ frontal scalp contusion. No underlying fracture. CT  CERVICAL SPINE FINDINGS There is mild motion artifact. Alignment: Mild curvature convex left. Subtle posterior subluxation of C5 on C6 likely degenerative. Skull base and vertebrae: Mild to moderate spondylosis of the cervical spine to include uncovertebral joint spurring and facet arthropathy. Atlantoaxial articulation is within normal. No definite acute fracture or traumatic subluxation. Significant neural foraminal narrowing at several levels due to adjacent bony spurring. Soft tissues and spinal canal: No prevertebral fluid or swelling. No visible canal hematoma. Disc levels: Moderate disc space narrowing at the C5-6 level and to lesser extent at the C6-7 level. Upper chest: 1.2 cm hypodense nodule over the left thyroid. Other: None. IMPRESSION: No acute intracranial findings. Large left periportal/ frontal scalp contusion. No underlying fracture. Chronic ischemic microvascular disease and small old lacunar infarcts as described. No acute cervical spine injury. Moderate spondylosis of the cervical spine with multilevel disc disease and multilevel neural foraminal narrowing as described. Electronically Signed   By: Elberta Fortis M.D.   On: 01/22/2017 20:27   Ct Cervical Spine Wo Contrast  Result Date: 01/22/2017 CLINICAL DATA:  Fall from wheelchair at nursing home today with left periorbital hematoma. EXAM: CT HEAD WITHOUT CONTRAST CT CERVICAL SPINE WITHOUT CONTRAST TECHNIQUE: Multidetector CT imaging of the head and cervical spine was performed following the standard protocol without intravenous contrast. Multiplanar CT image reconstructions of the cervical spine were also generated. COMPARISON:  Head CT 10/23/2016 FINDINGS: CT HEAD FINDINGS There is motion artifact. Brain: Ventricles and cisterns are within normal. There is chronic ischemic microvascular disease present. Old lacunar infarct over the head of the right caudate nucleus as well as the right periventricular region unchanged. Chronic ischemic  change over the junction of the midbrain to pons. No evidence of mass, mass effect, shift of midline structures or acute hemorrhage. No definite acute infarction. Vascular: No hyperdense vessel or unexpected calcification. Skull: Normal. Negative for fracture or focal lesion. Sinuses/Orbits: Orbits are within normal.  Sinuses are clear. Other: Large left periorbital/ frontal scalp contusion. No underlying fracture. CT CERVICAL SPINE FINDINGS There is mild motion artifact. Alignment: Mild curvature convex left. Subtle posterior subluxation of C5 on C6 likely degenerative. Skull base and vertebrae: Mild to moderate spondylosis of the cervical spine to include uncovertebral joint spurring and facet arthropathy. Atlantoaxial articulation is within  normal. No definite acute fracture or traumatic subluxation. Significant neural foraminal narrowing at several levels due to adjacent bony spurring. Soft tissues and spinal canal: No prevertebral fluid or swelling. No visible canal hematoma. Disc levels: Moderate disc space narrowing at the C5-6 level and to lesser extent at the C6-7 level. Upper chest: 1.2 cm hypodense nodule over the left thyroid. Other: None. IMPRESSION: No acute intracranial findings. Large left periportal/ frontal scalp contusion. No underlying fracture. Chronic ischemic microvascular disease and small old lacunar infarcts as described. No acute cervical spine injury. Moderate spondylosis of the cervical spine with multilevel disc disease and multilevel neural foraminal narrowing as described. Electronically Signed   By: Elberta Fortis M.D.   On: 01/22/2017 20:27   US Renal  Result Date: 01/26/2017 CLINICAL DATA:  Acute kidney injury. EXAM: RENAL / URINARY TRACT ULTRASOUND COMPLETE COMPARISON:  04/23/2009 FINDINGS: Right Kidney: Length: 10.1 cm. Echogenicity within normal limits. No mass or hydronephrosis visualized. Left Kidney: Length: 9.7 cm. Echogenicity within normal limits. No mass or  hydronephrosis visualized. Bladder: Appears normal for degree of bladder distention. IMPRESSION: Normal renal ultrasound. Electronically Signed   By: Paulina Fusi M.D.   On: 01/26/2017 14:02   Ct Hip Left Wo Contrast  Result Date: 01/26/2017 CLINICAL DATA:  Left hip pain due to a fall 01/22/2017. Initial encounter. EXAM: CT OF THE LEFT HIP WITHOUT CONTRAST TECHNIQUE: Multidetector CT imaging of the left hip was performed according to the standard protocol. Multiplanar CT image reconstructions were also generated. COMPARISON:  Plain films left hip 01/22/2017. FINDINGS: Bones/Joint/Cartilage The left hip is located. No fracture is identified. No focal bony lesion is seen. Mild degenerative change is present about the left SI joint and hip. Degenerative disc disease is partially visualized at L5-S1. Ligaments Suboptimally assessed by CT. Muscles and Tendons Intact and unremarkable. Soft tissues No acute abnormality is seen. Sigmoid diverticulosis and atherosclerosis are noted. IMPRESSION: Negative for fracture or other acute abnormality. Very mild degenerative disease about the left hip and SI joint. Degenerative disc disease L5-S1. Sigmoid diverticulosis. Atherosclerosis. Electronically Signed   By: Drusilla Kanner M.D.   On: 01/26/2017 13:03   Dg Shoulder Left  Result Date: 01/26/2017 CLINICAL DATA:  Left shoulder pain after fall. EXAM: LEFT SHOULDER - 2+ VIEW COMPARISON:  Radiographs of January 22, 2017. FINDINGS: There is no evidence of fracture or dislocation. Moderate degenerative changes seen involving the left acromioclavicular joint. Soft tissues are unremarkable. IMPRESSION: Moderate degenerative joint disease of left acromioclavicular joint. No acute abnormality seen in the left shoulder. Electronically Signed   By: Lupita Raider, M.D.   On: 01/26/2017 13:43   Dg Shoulder Left  Result Date: 01/22/2017 CLINICAL DATA:  Left shoulder and hip pain after a fall EXAM: LEFT SHOULDER - 2+ VIEW  COMPARISON:  None. FINDINGS: Left lung apex is clear. Moderate AC joint degenerative changes. No fracture or dislocation. IMPRESSION: No acute osseous abnormality. Electronically Signed   By: Jasmine Pang M.D.   On: 01/22/2017 19:51   Dg Foot 2 Views Left  Result Date: 01/26/2017 CLINICAL DATA:  Left foot pain after fall. EXAM: LEFT FOOT - 2 VIEW COMPARISON:  None. FINDINGS: There is no evidence of fracture or dislocation. There is no evidence of arthropathy or other focal bone abnormality. Soft tissues are unremarkable. IMPRESSION: No significant abnormality seen in the left foot. Electronically Signed   By: Lupita Raider, M.D.   On: 01/26/2017 13:41   Dg Hip Unilat W Or Wo Pelvis  2-3 Views Left  Result Date: 01/22/2017 CLINICAL DATA:  Left hip pain after a fall EXAM: DG HIP (WITH OR WITHOUT PELVIS) 2-3V LEFT COMPARISON:  10/21/2016 FINDINGS: The SI joints are symmetric. Pubic symphysis appears intact. No acute displaced fracture or malalignment. IMPRESSION: No acute osseous abnormality Electronically Signed   By: Jasmine Pang M.D.   On: 01/22/2017 19:53    Microbiology: Recent Results (from the past 240 hour(s))  Blood culture (routine x 2)     Status: None (Preliminary result)   Collection Time: 01/26/17 11:30 AM  Result Value Ref Range Status   Specimen Description BLOOD RIGHT ANTECUBITAL  Final   Special Requests   Final    BOTTLES DRAWN AEROBIC AND ANAEROBIC Blood Culture adequate volume   Culture NO GROWTH 4 DAYS  Final   Report Status PENDING  Incomplete  Blood culture (routine x 2)     Status: None (Preliminary result)   Collection Time: 01/26/17 11:38 AM  Result Value Ref Range Status   Specimen Description BLOOD RIGHT HAND  Final   Special Requests IN PEDIATRIC BOTTLE Blood Culture adequate volume  Final   Culture NO GROWTH 4 DAYS  Final   Report Status PENDING  Incomplete  Urine Culture     Status: Abnormal   Collection Time: 01/26/17  4:15 PM  Result Value Ref Range  Status   Specimen Description URINE, CATHETERIZED  Final   Special Requests NONE  Final   Culture MULTIPLE SPECIES PRESENT, SUGGEST RECOLLECTION (A)  Final   Report Status 01/27/2017 FINAL  Final  MRSA PCR Screening     Status: None   Collection Time: 01/26/17  5:30 PM  Result Value Ref Range Status   MRSA by PCR NEGATIVE NEGATIVE Final    Comment:        The GeneXpert MRSA Assay (FDA approved for NASAL specimens only), is one component of a comprehensive MRSA colonization surveillance program. It is not intended to diagnose MRSA infection nor to guide or monitor treatment for MRSA infections.   Culture, Urine     Status: None   Collection Time: 01/27/17  5:35 PM  Result Value Ref Range Status   Specimen Description URINE, CATHETERIZED  Final   Special Requests NONE  Final   Culture NO GROWTH  Final   Report Status 01/28/2017 FINAL  Final     Labs: Basic Metabolic Panel:  Recent Labs Lab 01/26/17 1127 01/27/17 0501 01/28/17 0902 01/29/17 0609 01/30/17 0555  NA 137 138 142 140 139  139  K 5.2* 4.3 4.4 3.7 3.8  3.8  CL 108 112* 115* 109 106  106  CO2 19* 17* 20* 23 25  24   GLUCOSE 77 97 79 112* 106*  104*  BUN 60* 53* 46* 40* 36*  36*  CREATININE 4.75* 3.98* 3.57* 3.11* 2.97*  2.96*  CALCIUM 9.2 8.3* 8.4* 8.4* 8.9  8.8*  PHOS  --  4.0 3.3 3.1 3.2   Liver Function Tests:  Recent Labs Lab 01/26/17 1127 01/27/17 0501 01/28/17 0902 01/29/17 0609 01/30/17 0555  AST 22  --   --   --   --   ALT 13*  --   --   --   --   ALKPHOS 47  --   --   --   --   BILITOT 0.6  --   --   --   --   PROT 6.9  --   --   --   --  ALBUMIN 3.3* 2.6* 2.5* 2.4* 2.7*   No results for input(s): LIPASE, AMYLASE in the last 168 hours.  Recent Labs Lab 01/27/17 1626  AMMONIA 17   CBC:  Recent Labs Lab 01/26/17 1127 01/27/17 0501 01/28/17 0902 01/29/17 0609 01/30/17 0555 01/31/17 0502  WBC 11.3* 9.9 8.5 7.6 9.2 8.8  NEUTROABS 6.9  --  4.7 4.2 5.2 4.8  HGB 10.8*  9.4* 9.6* 9.1* 9.7* 9.7*  HCT 34.5* 30.2* 30.9* 29.0* 31.1* 30.3*  MCV 88.0 87.5 87.8 88.1 87.1 87.8  PLT 314 282 257 264 265 255   Cardiac Enzymes: No results for input(s): CKTOTAL, CKMB, CKMBINDEX, TROPONINI in the last 168 hours. BNP: BNP (last 3 results) No results for input(s): BNP in the last 8760 hours.  ProBNP (last 3 results) No results for input(s): PROBNP in the last 8760 hours.  CBG:  Recent Labs Lab 01/29/17 2217 01/30/17 0807 01/30/17 1204 01/30/17 1635 01/30/17 2111  GLUCAP 160* 101* 112* 121* 113*       Signed:  Rhetta Mura MD   [facility] 01/31/2017, 9:52 AM

## 2017-01-31 NOTE — Clinical Social Work Note (Signed)
Ms. Duanne LimerickMeisel is medically stable for discharge today and will [facility], as confirmed by her friend Ellsworth Lennoxravis Compton. Ms. Duanne LimerickMeisel will be transported by ambulance. CSW signing off as patient is discharging and transport has been called.  Genelle BalVanessa Johonna Binette, MSW, [name] Licensed Clinical Social Worker Clinical Social Work Department [facility] [phone]

## 2017-01-31 NOTE — Discharge Planning (Signed)
[facility] and report given to Marylene LandAngela, Charity fundraiserN. Pt to be transported by ambulance. Denies questions.

## 2017-02-01 ENCOUNTER — Telehealth: Payer: Self-pay | Admitting: Internal Medicine

## 2017-02-01 ENCOUNTER — Other Ambulatory Visit: Payer: Self-pay | Admitting: Nephrology

## 2017-02-01 DIAGNOSIS — N179 Acute kidney failure, unspecified: Secondary | ICD-10-CM

## 2017-02-01 NOTE — Telephone Encounter (Signed)
Routing to Jamie Aguirre, please advise in dr crawfords absence thanks

## 2017-02-01 NOTE — Telephone Encounter (Signed)
lorrain [phone] [facility]   Need verbals to resume nurse and PT

## 2017-02-01 NOTE — Telephone Encounter (Signed)
Ok to resume.

## 2017-02-02 NOTE — Telephone Encounter (Signed)
Advised answering service to let lorraine know of ok orders for this patient

## 2017-02-08 ENCOUNTER — Other Ambulatory Visit: Payer: Medicare Other

## 2017-03-14 DEATH — deceased

## 2018-03-31 IMAGING — DX DG ABD PORTABLE 1V
1 series · 1 of 1 positions shown · non-contrast
Comparison: None.

CLINICAL DATA: Abdominal pain

EXAM:
PORTABLE ABDOMEN - 1 VIEW

[abdomen kub]
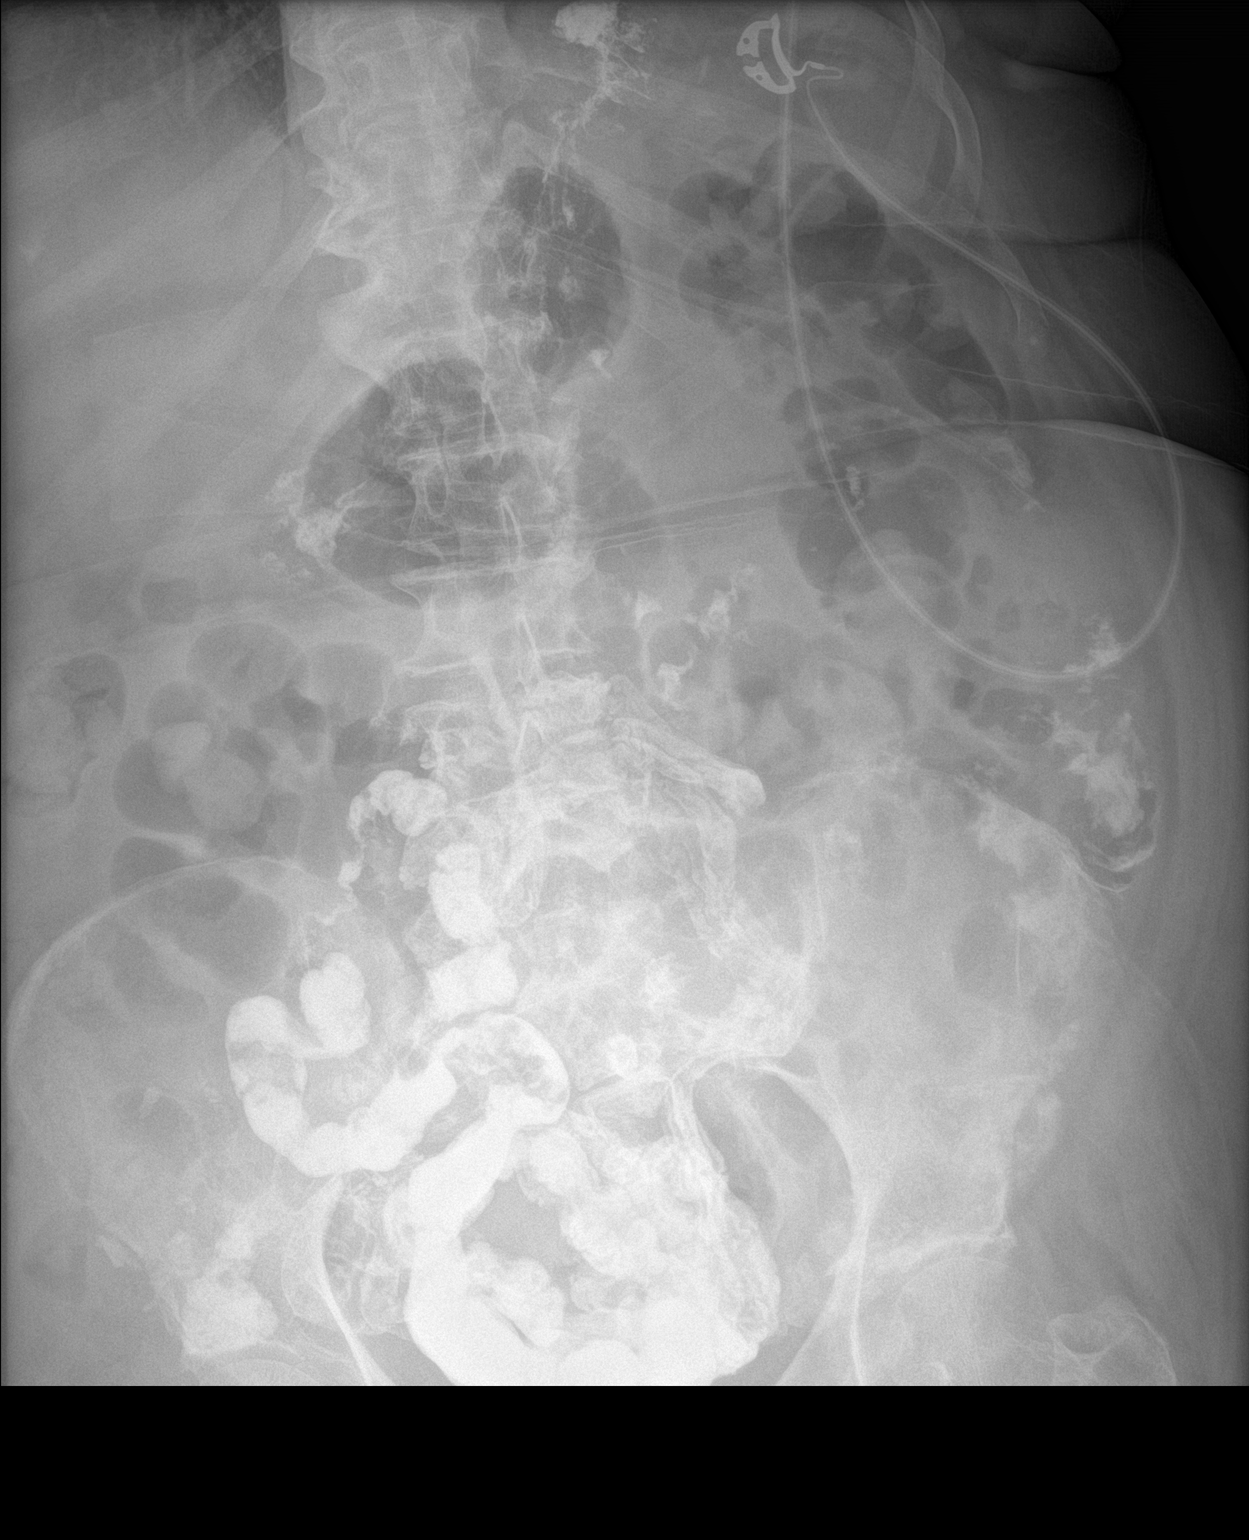

[1 of 1 positions shown; findings below may reference images not displayed]

FINDINGS: Contrast material is noted throughout the large and small bowel
consistent with the recent modified barium swallow. No obstructive
changes are seen. No free air is noted. No findings of ascites are
seen. Degenerative change of the thoracolumbar spine is noted.
IMPRESSION: No acute abnormality seen.

## 2018-06-29 IMAGING — DX DG HIP (WITH OR WITHOUT PELVIS) 2-3V*L*
3 series · 3 of 3 positions shown · non-contrast
Comparison: 10/21/2016

CLINICAL DATA: Left hip pain after a fall

EXAM:
DG HIP (WITH OR WITHOUT PELVIS) 2-3V LEFT

[pelvis ap]
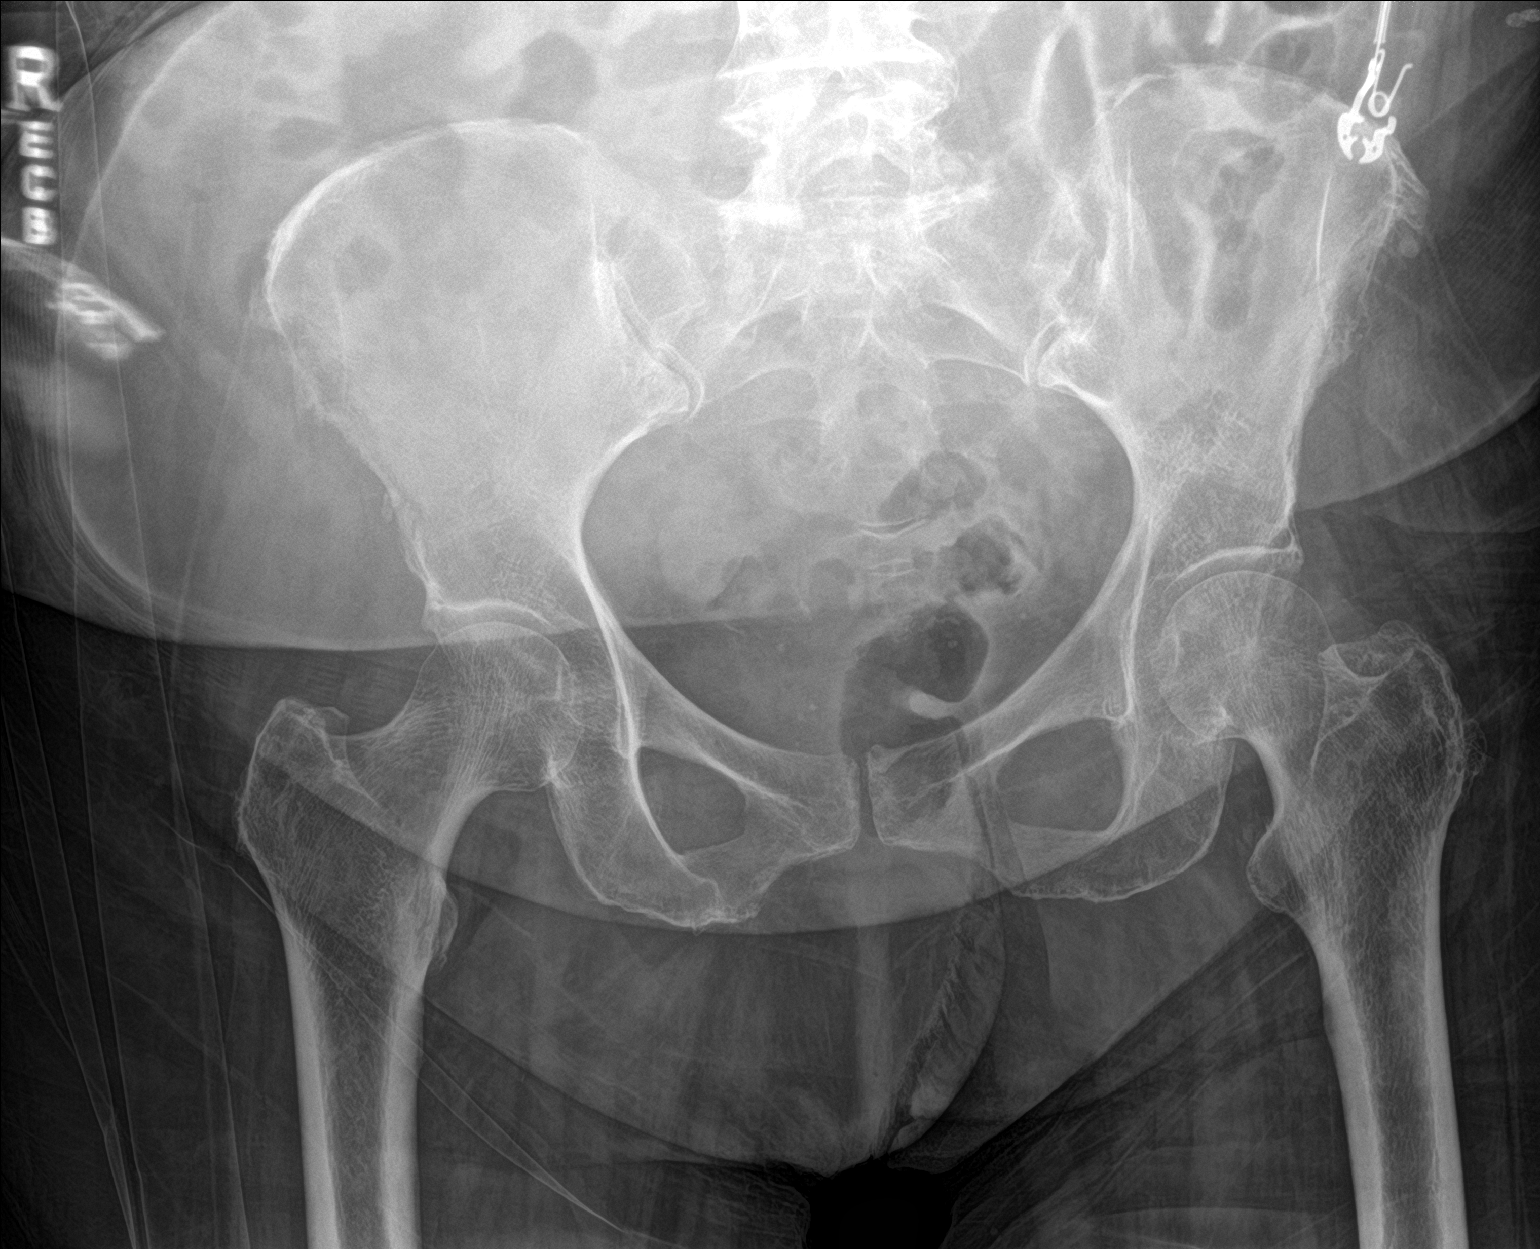

[hip ap]
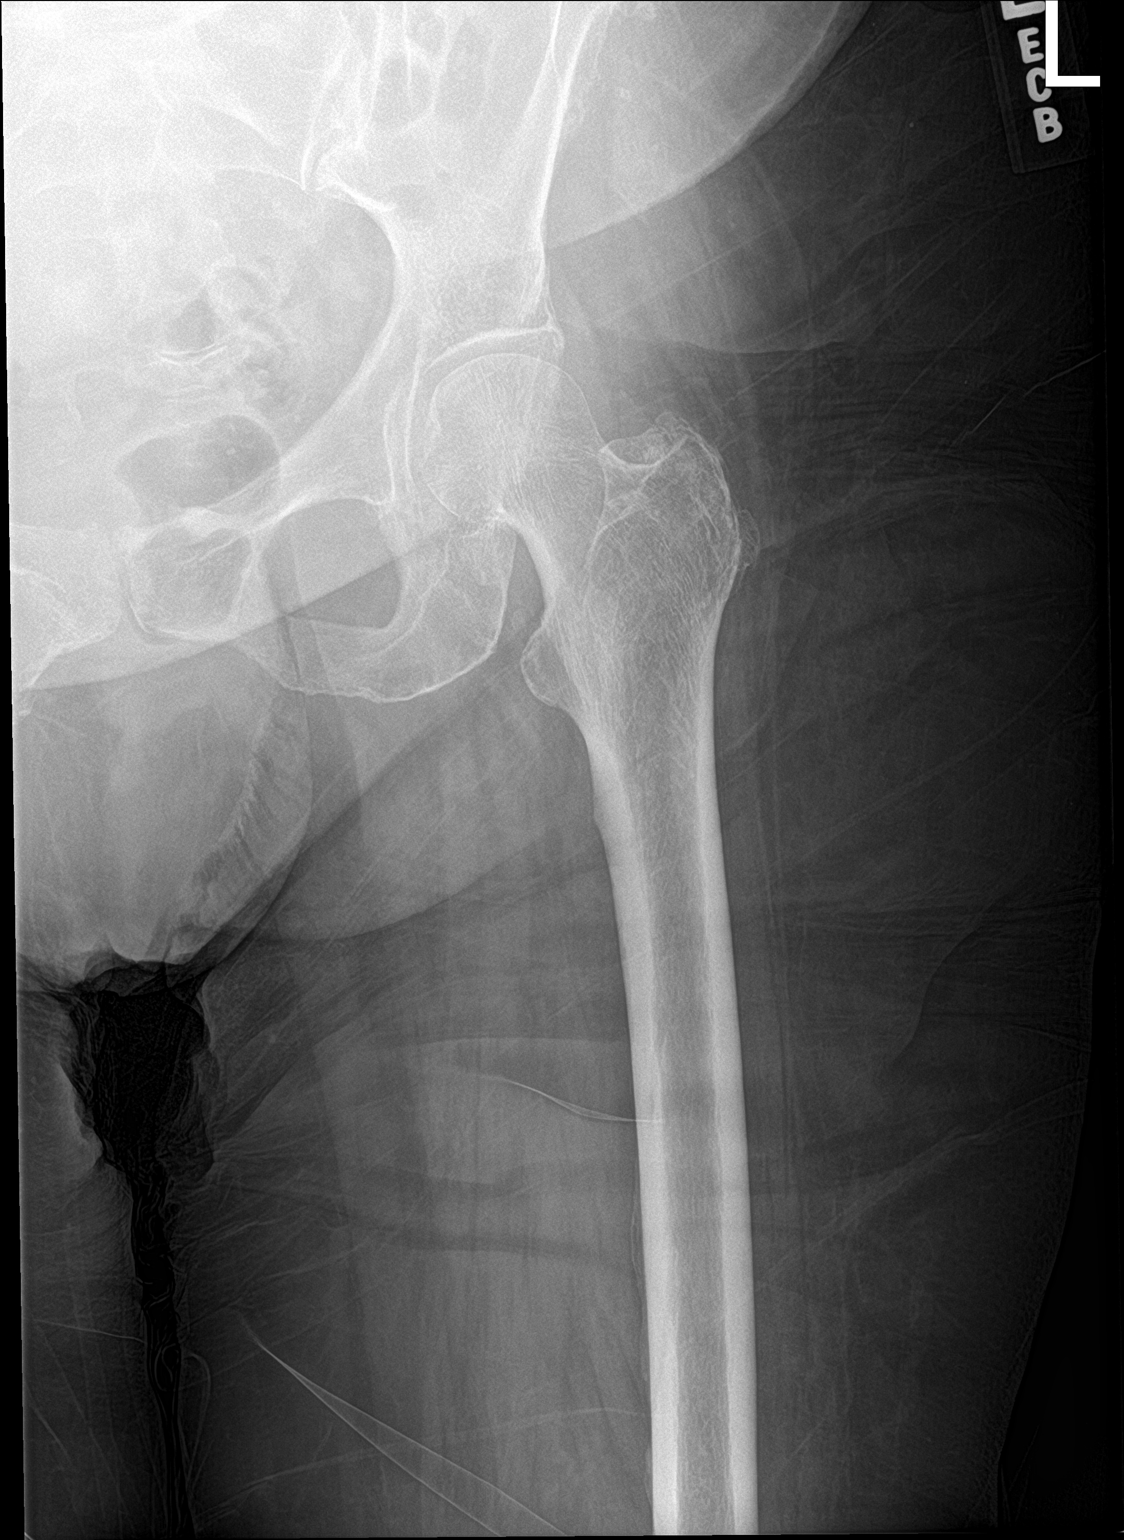

[hip lat]
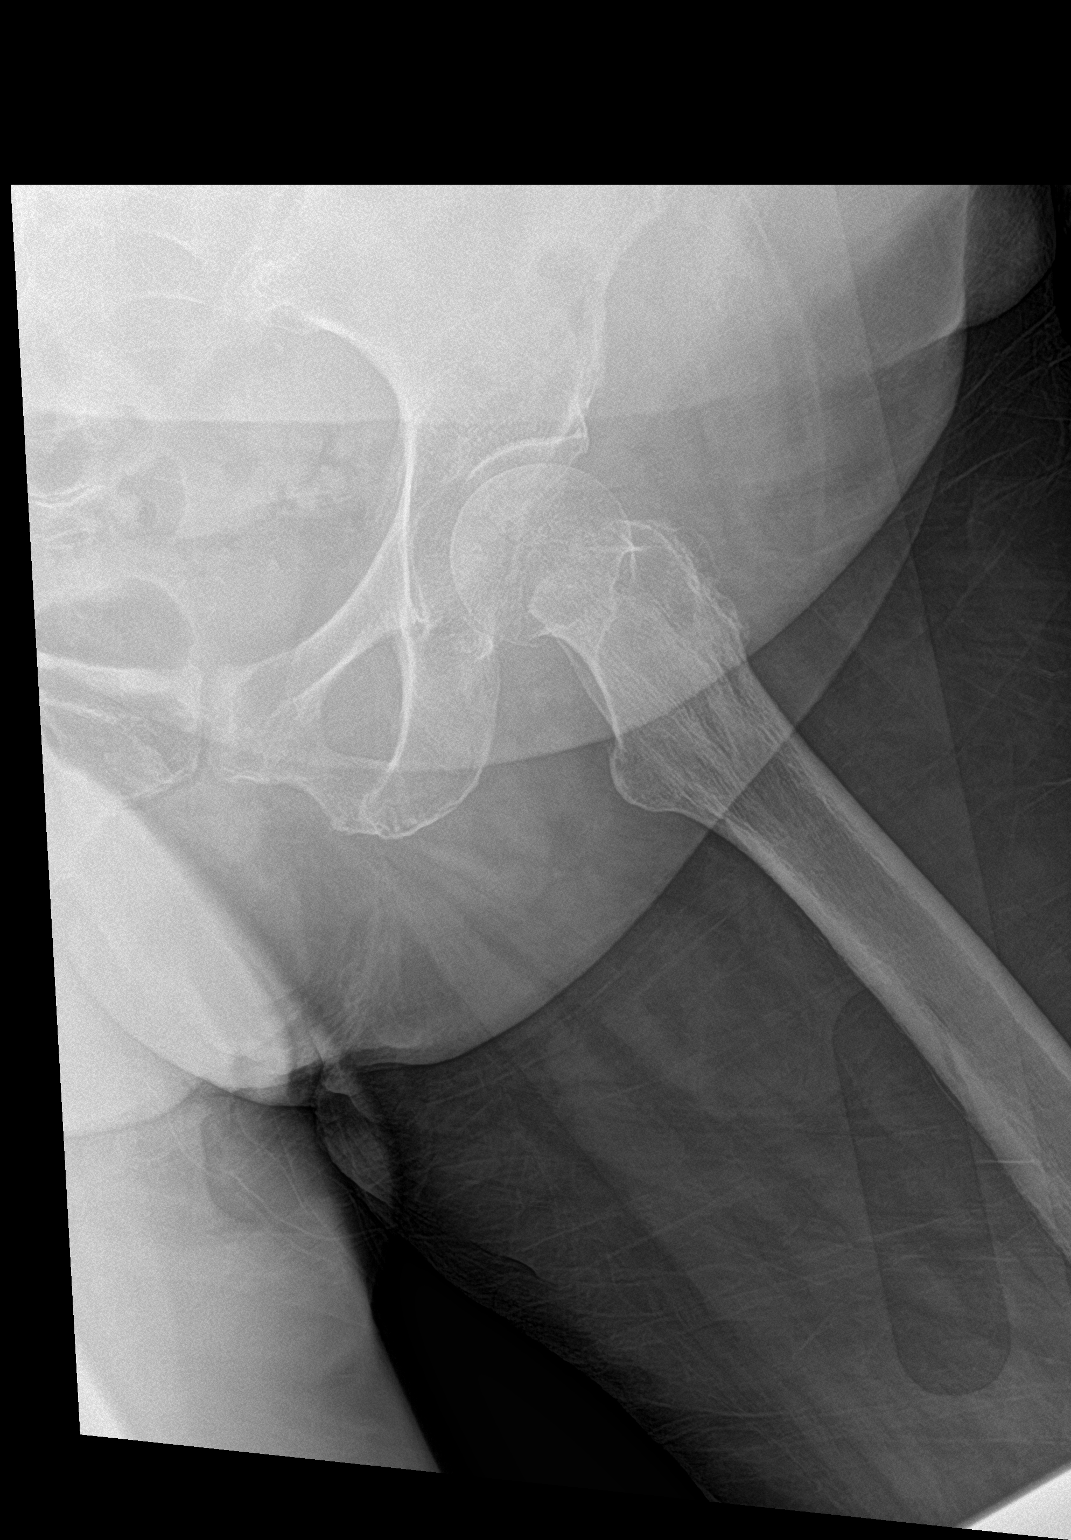

[3 of 3 positions shown; findings below may reference images not displayed]

FINDINGS: The SI joints are symmetric. Pubic symphysis appears intact. No
acute displaced fracture or malalignment.
IMPRESSION: No acute osseous abnormality

## 2018-06-29 IMAGING — CT CT HEAD W/O CM
5 of 8 series · 17 of 47 positions shown, 18 images · non-contrast
Comparison: Head CT 10/23/2016

CLINICAL DATA: Fall from wheelchair at [HOSPITAL] today with left
periorbital hematoma.

EXAM:
CT HEAD WITHOUT CONTRAST
CT CERVICAL SPINE WITHOUT CONTRAST
TECHNIQUE: Multidetector CT imaging of the head and cervical spine was
performed following the standard protocol without intravenous
contrast. Multiplanar CT image reconstructions of the cervical spine
were also generated.

[Series 4: head 5.0 h30s · axial · 0.43mm/px · z∈[+1113,+1193]mm · 3 of 34 slices shown, 4 images]
[im 9/34  brain]
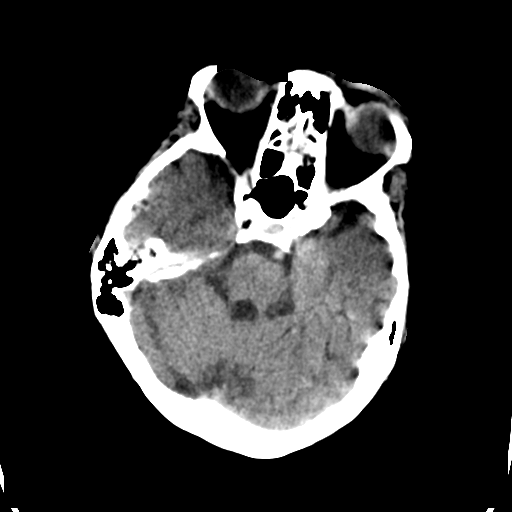
[im 9/34  bone]
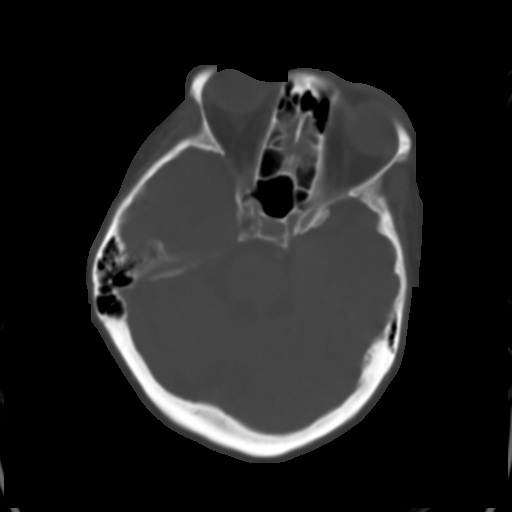
[im 17/34  brain]
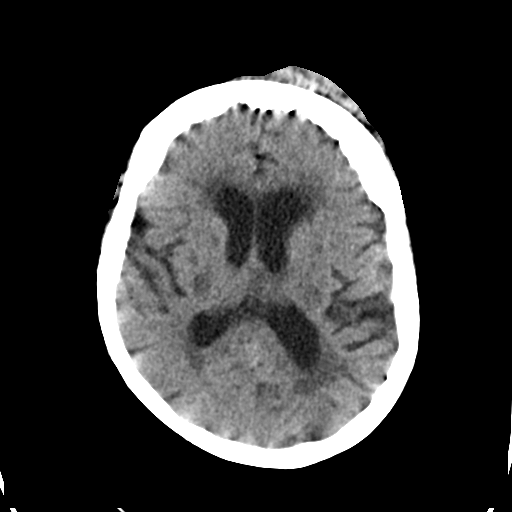
[im 25/34  brain]
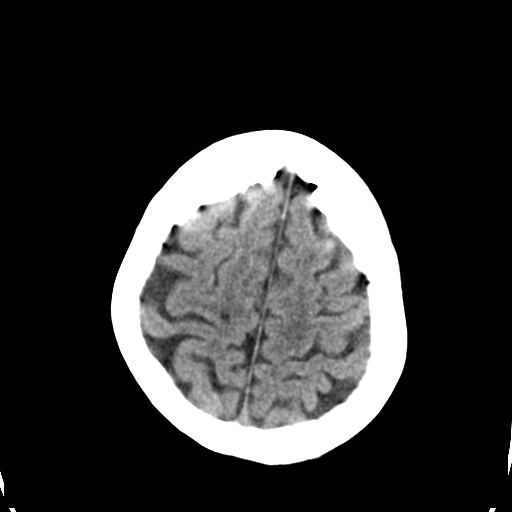

[Series 8: head 3.0 mpr cor · coronal · 0.32mm/px · 3 of 65 slices shown]
[im 17/65  brain]
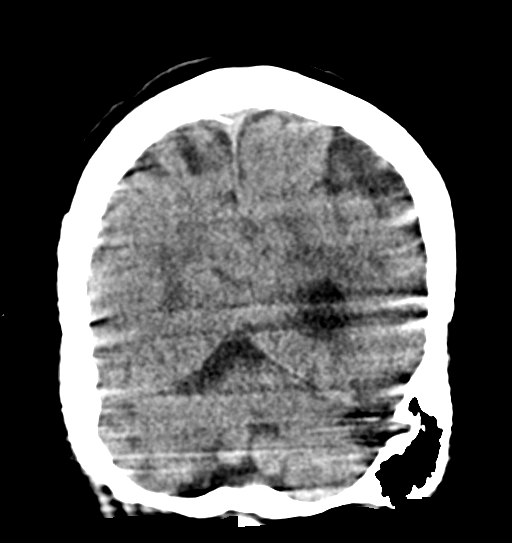
[im 33/65  brain]
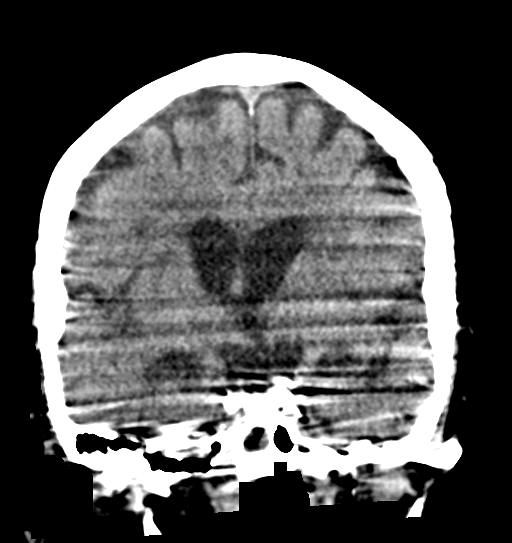
[im 49/65  brain]
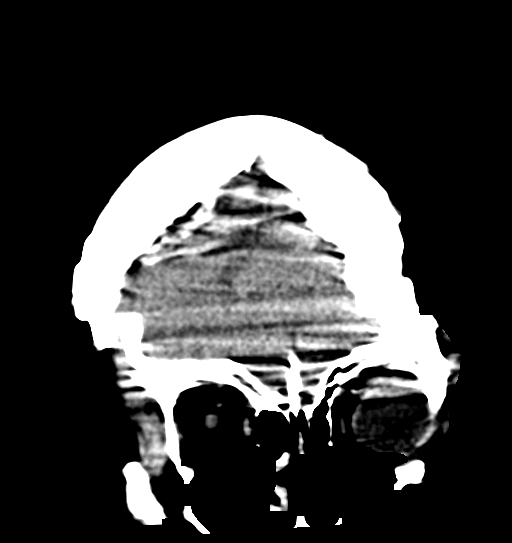

[Series 9: head 3.0 mpr sag · sagittal · 0.32mm/px · 1 of 53 slices shown]
[im 27/53  brain]
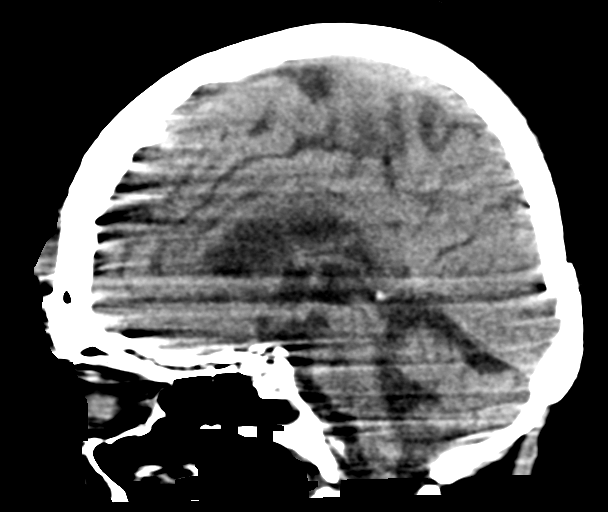

[Series 12: c_spine 2.0 st · axial · 0.25mm/px · z∈[+935,+1073]mm · 8 of 83 slices shown (1 of 2)]
[im 7/83  brain]
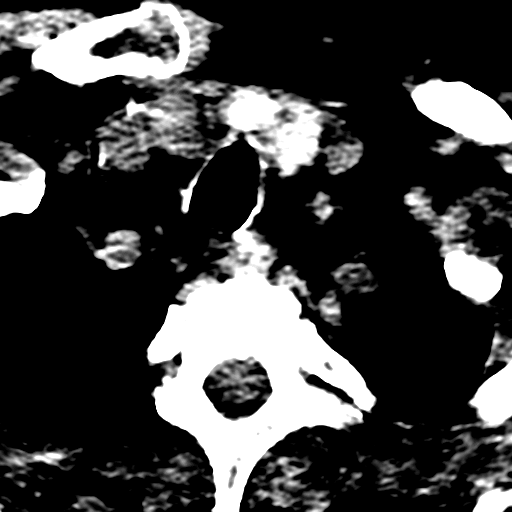
[im 19/83  brain]
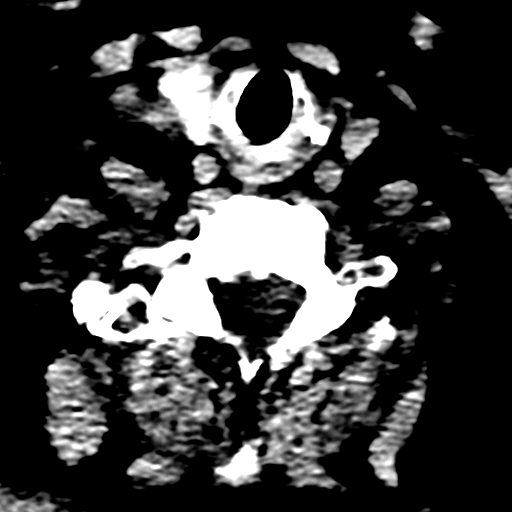
[im 26/83  brain]
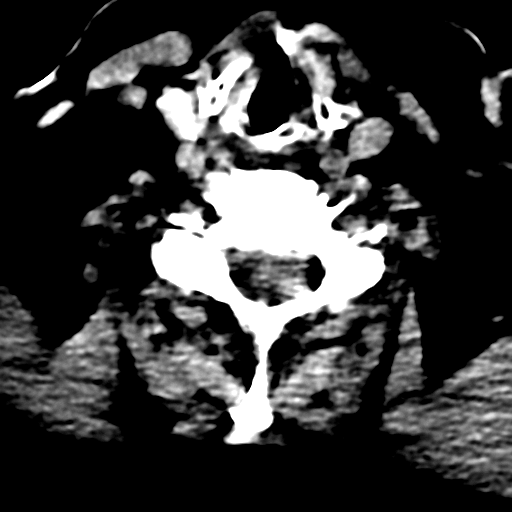
[im 38/83  brain]
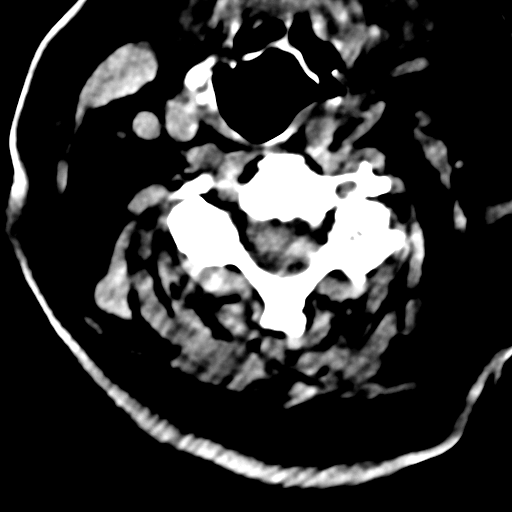
[im 45/83  brain]
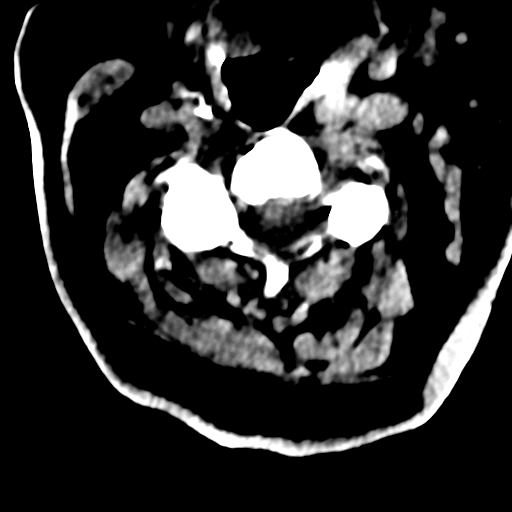
[im 57/83  brain]
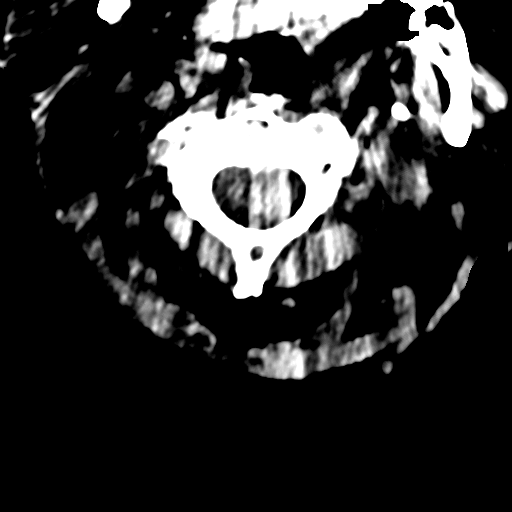
[im 64/83  brain]
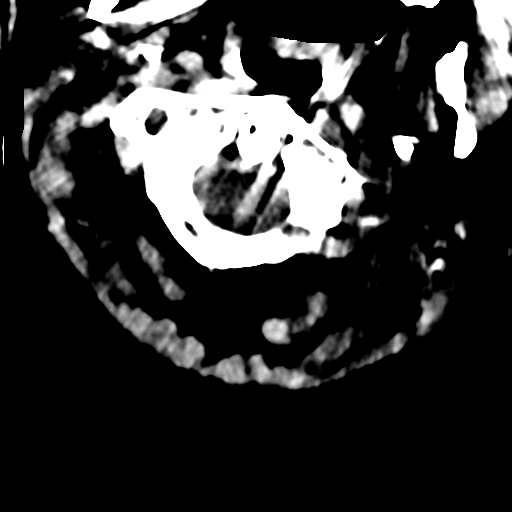
[im 76/83  brain]
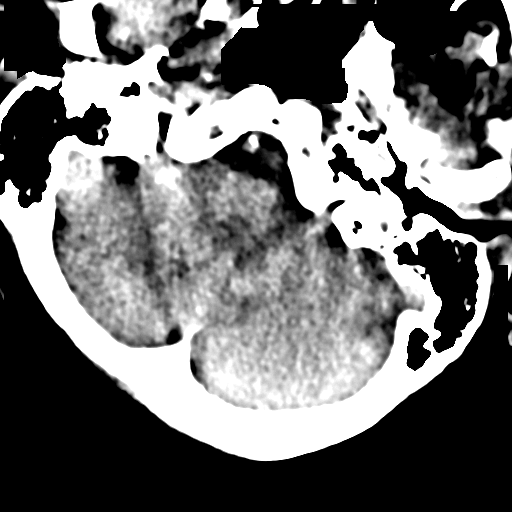

[Series 20: c_spine 2.0 st · axial · 0.25mm/px · z∈[+964,+978]mm · 2 of 50 slices shown (2 of 2)]
[im 8/50  brain]
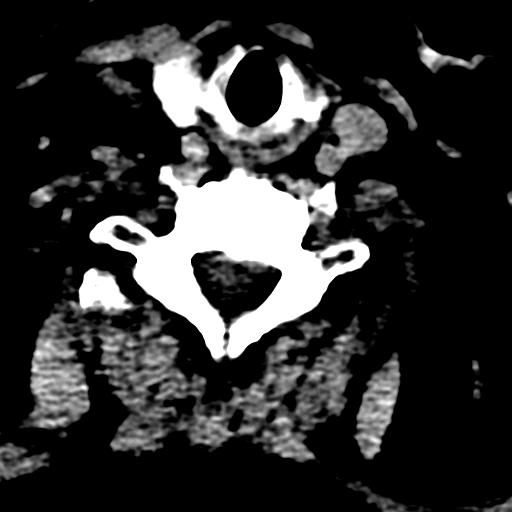
[im 15/50  brain]
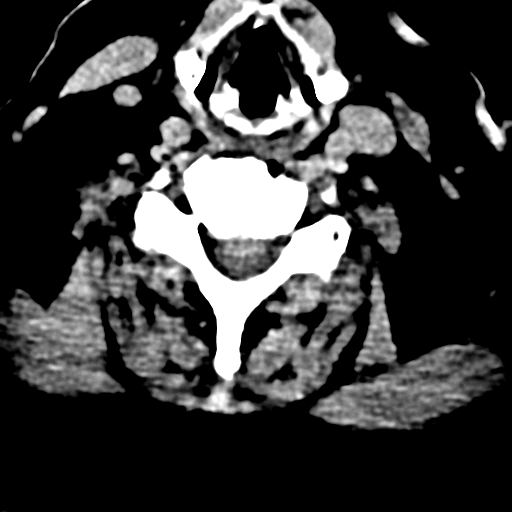

[17 of 47 positions shown; findings below may reference images not displayed]

FINDINGS: CT HEAD FINDINGS

There is motion artifact.

Brain: Ventricles and cisterns are within normal. There is chronic
ischemic microvascular disease present. Old lacunar infarct over the
head of the right caudate nucleus as well as the right
periventricular region unchanged. Chronic ischemic change over the
junction of the midbrain to pons. No evidence of mass, mass effect,
shift of midline structures or acute hemorrhage. No definite acute
infarction.

Vascular: No hyperdense vessel or unexpected calcification.

Skull: Normal. Negative for fracture or focal lesion.

Sinuses/Orbits: Orbits are within normal.  Sinuses are clear.

Other: Large left periorbital/ frontal scalp contusion. No
underlying fracture.

CT CERVICAL SPINE FINDINGS

There is mild motion artifact.

Alignment: Mild curvature convex left. Subtle posterior subluxation
of C5 on C6 likely degenerative.

Skull base and vertebrae: Mild to moderate spondylosis of the
cervical spine to include uncovertebral joint spurring and facet
arthropathy. Atlantoaxial articulation is within normal. No definite
acute fracture or traumatic subluxation. Significant neural
foraminal narrowing at several levels due to adjacent bony spurring.

Soft tissues and spinal canal: No prevertebral fluid or swelling. No
visible canal hematoma.

Disc levels: Moderate disc space narrowing at the C5-6 level and to
lesser extent at the C6-7 level.

Upper chest: 1.2 cm hypodense nodule over the left thyroid.

Other: None.
IMPRESSION: No acute intracranial findings.

Large left periportal/ frontal scalp contusion. No underlying
fracture.

Chronic ischemic microvascular disease and small old lacunar
infarcts as described.

No acute cervical spine injury.

Moderate spondylosis of the cervical spine with multilevel disc
disease and multilevel neural foraminal narrowing as described.
